# Patient Record
Sex: Female | Born: 1955
Health system: Southern US, Community
[De-identification: ages and names within clinical notes are randomized; demographics above are authoritative.]

## PROBLEM LIST (undated history)

## (undated) DIAGNOSIS — Z923 Personal history of irradiation: Secondary | ICD-10-CM

## (undated) DIAGNOSIS — Z801 Family history of malignant neoplasm of trachea, bronchus and lung: Secondary | ICD-10-CM

## (undated) DIAGNOSIS — R232 Flushing: Secondary | ICD-10-CM

## (undated) DIAGNOSIS — G4700 Insomnia, unspecified: Secondary | ICD-10-CM

## (undated) DIAGNOSIS — R112 Nausea with vomiting, unspecified: Secondary | ICD-10-CM

## (undated) DIAGNOSIS — R5383 Other fatigue: Secondary | ICD-10-CM

## (undated) DIAGNOSIS — R87619 Unspecified abnormal cytological findings in specimens from cervix uteri: Secondary | ICD-10-CM

## (undated) DIAGNOSIS — Z8042 Family history of malignant neoplasm of prostate: Secondary | ICD-10-CM

## (undated) DIAGNOSIS — C50919 Malignant neoplasm of unspecified site of unspecified female breast: Secondary | ICD-10-CM

## (undated) DIAGNOSIS — Z9889 Other specified postprocedural states: Secondary | ICD-10-CM

## (undated) DIAGNOSIS — L409 Psoriasis, unspecified: Secondary | ICD-10-CM

## (undated) DIAGNOSIS — M1990 Unspecified osteoarthritis, unspecified site: Secondary | ICD-10-CM

## (undated) DIAGNOSIS — Z973 Presence of spectacles and contact lenses: Secondary | ICD-10-CM

## (undated) DIAGNOSIS — Z800 Family history of malignant neoplasm of digestive organs: Secondary | ICD-10-CM

## (undated) HISTORY — DX: Malignant neoplasm of unspecified site of unspecified female breast: C50.919

## (undated) HISTORY — DX: Family history of malignant neoplasm of prostate: Z80.42

## (undated) HISTORY — PX: KNEE SURGERY: SHX244

## (undated) HISTORY — DX: Family history of malignant neoplasm of digestive organs: Z80.0

## (undated) HISTORY — DX: Flushing: R23.2

## (undated) HISTORY — DX: Unspecified osteoarthritis, unspecified site: M19.90

## (undated) HISTORY — DX: Unspecified abnormal cytological findings in specimens from cervix uteri: R87.619

## (undated) HISTORY — DX: Family history of malignant neoplasm of trachea, bronchus and lung: Z80.1

## (undated) HISTORY — DX: Insomnia, unspecified: G47.00

## (undated) HISTORY — PX: POLYPECTOMY: SHX149

## (undated) HISTORY — DX: Other specified postprocedural states: Z98.890

## (undated) HISTORY — PX: DILATION AND CURETTAGE OF UTERUS: SHX78

## (undated) HISTORY — DX: Presence of spectacles and contact lenses: Z97.3

## (undated) HISTORY — DX: Other fatigue: R53.83

## (undated) HISTORY — DX: Psoriasis, unspecified: L40.9

---
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
## 1980-03-29 HISTORY — PX: MANDIBLE SURGERY: SHX707

## 1997-09-04 ENCOUNTER — Ambulatory Visit (HOSPITAL_COMMUNITY): Admission: RE | Admit: 1997-09-04 | Discharge: 1997-09-04 | Payer: Self-pay | Admitting: Obstetrics & Gynecology

## 1998-08-28 ENCOUNTER — Inpatient Hospital Stay (HOSPITAL_COMMUNITY): Admission: AD | Admit: 1998-08-28 | Discharge: 1998-08-29 | Payer: Self-pay | Admitting: Obstetrics and Gynecology

## 2000-06-06 ENCOUNTER — Encounter: Admission: RE | Admit: 2000-06-06 | Discharge: 2000-06-06 | Payer: Self-pay | Admitting: Internal Medicine

## 2000-06-06 ENCOUNTER — Encounter: Payer: Self-pay | Admitting: Internal Medicine

## 2001-03-29 DIAGNOSIS — Z9889 Other specified postprocedural states: Secondary | ICD-10-CM

## 2001-03-29 HISTORY — DX: Other specified postprocedural states: Z98.890

## 2001-07-19 ENCOUNTER — Other Ambulatory Visit: Admission: RE | Admit: 2001-07-19 | Discharge: 2001-07-19 | Payer: Self-pay | Admitting: *Deleted

## 2002-07-26 ENCOUNTER — Other Ambulatory Visit: Admission: RE | Admit: 2002-07-26 | Discharge: 2002-07-26 | Payer: Self-pay | Admitting: *Deleted

## 2003-12-10 ENCOUNTER — Other Ambulatory Visit: Admission: RE | Admit: 2003-12-10 | Discharge: 2003-12-10 | Payer: Self-pay | Admitting: *Deleted

## 2005-01-20 ENCOUNTER — Other Ambulatory Visit: Admission: RE | Admit: 2005-01-20 | Discharge: 2005-01-20 | Payer: Self-pay | Admitting: *Deleted

## 2005-12-12 ENCOUNTER — Emergency Department (HOSPITAL_COMMUNITY): Admission: EM | Admit: 2005-12-12 | Discharge: 2005-12-12 | Payer: Self-pay | Admitting: Emergency Medicine

## 2006-11-30 ENCOUNTER — Other Ambulatory Visit: Admission: RE | Admit: 2006-11-30 | Discharge: 2006-11-30 | Payer: Self-pay | Admitting: *Deleted

## 2007-04-12 ENCOUNTER — Ambulatory Visit (HOSPITAL_BASED_OUTPATIENT_CLINIC_OR_DEPARTMENT_OTHER): Admission: RE | Admit: 2007-04-12 | Discharge: 2007-04-12 | Payer: Self-pay | Admitting: Orthopedic Surgery

## 2010-08-11 NOTE — Op Note (Signed)
NAME:  Jennifer Walker, Jennifer Walker NO.:  [id number]   MEDICAL RECORD NO.:  [id number]          PATIENT TYPE:  AMB   LOCATION:  [facility]                         FACILITY:  [facility]   PHYSICIAN:  Ollen Gross, M.D.    DATE OF BIRTH:  03-14-1956   DATE OF PROCEDURE:  04/12/2007  DATE OF DISCHARGE:                               OPERATIVE REPORT   PREOPERATIVE DIAGNOSIS:  Right knee lateral meniscal tear.   POSTOPERATIVE DIAGNOSIS:  Right knee lateral meniscal tear, plus medial  meniscal tear.   PROCEDURE:  Right knee arthroscopy with medial and lateral meniscal  debridement.   SURGEON:  Ollen Gross, M.D.   ASSISTANT:  None.   ANESTHESIA:  General.   ESTIMATED BLOOD LOSS:  Minimal.   DRAINS:  None.   COMPLICATIONS:  None.   CONDITION:  Stable to recovery.   CLINICAL NOTE:  [name] is a 55 year old female who has significant  right knee pain and mechanical symptoms for several months.  She had  similar problems with her left knee a couple of years ago, had an  arthroscopy with excellent improvement.  She has a documented lateral  meniscal tear and worsening symptoms and presents now for arthroscopy  and debridement.   PROCEDURE IN DETAIL:  After successful initiation of general anesthetic,  a tourniquet was placed high on the right thigh and the right lower  extremity prepped and draped in the usual sterile fashion.  Standard  superomedial and inferolateral incisions were made and in flow cannula  passed superomedial and camera passed inferolateral.  Arthroscopic  visualization proceeds.  The under surface of the patella and trochlea  shows grade 1 and 2 chondromalacia of the patella but the trochlea looks  normal.  No evidence of any unstable cartilage on the patella.  Medial  and lateral gutters were visualized and there is no loose bodies  present.  Flexion and valgus force is applied to the knee and the medial  compartment centered.  There is evidence of a  degenerative tear in the  posterior horn of the medial meniscus.  A spinal needle is used to  localize an inferomedial portal, a small incision made, dilator placed,  and then meniscus was debrided back to a stable base with baskets and  4.2 mm shaver and sealed off with the ArthroCare device.  The medial  femoral condyle and tibial plateau looked normal.   The intercondylar notch was visualized, the ACL was normal.  The lateral  compartment was entered.  She does have a degenerative tear body and  posterior horn extending almost to the anterior horn of the lateral  meniscus.  This was debrided back to a stable base with baskets and a  shaver, 4.2 mm, and then is sealed off with the ArthroCare device.  There is some chondromalacia in the lateral compartment but no full  thickness disease.  The rest of the joint was again inspected, there  were no further tears, loose bodies, or defects.  The arthroscopic  equipment was removed from the inferior portals which were closed with  interrupted 4-0 nylon.  20 mL of 0.25% Marcaine with  epi were injected  through the inflow cannula then that is removed and that portal closed  with nylon.  A bulky sterile dressing is then applied and she is  awakened and transferred to recovery in stable condition.      Ollen Gross, M.D.  Electronically Signed     FA/MEDQ  D:  04/12/2007  T:  04/12/2007  Job:  [id number]

## 2010-12-17 LAB — POCT HEMOGLOBIN-HEMACUE
Hemoglobin: 13.5
Operator id: 268271

## 2011-09-28 ENCOUNTER — Other Ambulatory Visit: Payer: Self-pay | Admitting: Obstetrics and Gynecology

## 2011-09-28 DIAGNOSIS — R928 Other abnormal and inconclusive findings on diagnostic imaging of breast: Secondary | ICD-10-CM

## 2011-10-05 ENCOUNTER — Ambulatory Visit
Admission: RE | Admit: 2011-10-05 | Discharge: 2011-10-05 | Disposition: A | Discharge status: Home / Self Care | Payer: 59 | Source: Ambulatory Visit | Attending: Obstetrics and Gynecology | Admitting: Obstetrics and Gynecology

## 2011-10-05 ENCOUNTER — Other Ambulatory Visit: Payer: Self-pay | Admitting: Obstetrics and Gynecology

## 2011-10-05 DIAGNOSIS — R928 Other abnormal and inconclusive findings on diagnostic imaging of breast: Secondary | ICD-10-CM

## 2011-10-05 DIAGNOSIS — N6320 Unspecified lump in the left breast, unspecified quadrant: Secondary | ICD-10-CM

## 2011-10-06 ENCOUNTER — Other Ambulatory Visit: Payer: Self-pay | Admitting: Obstetrics and Gynecology

## 2011-10-06 DIAGNOSIS — C50912 Malignant neoplasm of unspecified site of left female breast: Secondary | ICD-10-CM

## 2011-10-07 ENCOUNTER — Telehealth: Payer: Self-pay | Admitting: *Deleted

## 2011-10-07 ENCOUNTER — Other Ambulatory Visit: Payer: Self-pay | Admitting: *Deleted

## 2011-10-07 DIAGNOSIS — C50419 Malignant neoplasm of upper-outer quadrant of unspecified female breast: Secondary | ICD-10-CM

## 2011-10-07 DIAGNOSIS — C50412 Malignant neoplasm of upper-outer quadrant of left female breast: Secondary | ICD-10-CM | POA: Insufficient documentation

## 2011-10-07 NOTE — Telephone Encounter (Signed)
Confirmed [facility] for 10/13/11 at 0800 .  Instructions and contact information given.  

## 2011-10-08 ENCOUNTER — Ambulatory Visit
Admission: RE | Admit: 2011-10-08 | Discharge: 2011-10-08 | Disposition: A | Discharge status: Home / Self Care | Payer: 59 | Source: Ambulatory Visit | Attending: Obstetrics and Gynecology | Admitting: Obstetrics and Gynecology

## 2011-10-08 ENCOUNTER — Other Ambulatory Visit: Payer: Self-pay | Admitting: Obstetrics and Gynecology

## 2011-10-08 DIAGNOSIS — C50912 Malignant neoplasm of unspecified site of left female breast: Secondary | ICD-10-CM

## 2011-10-08 DIAGNOSIS — R928 Other abnormal and inconclusive findings on diagnostic imaging of breast: Secondary | ICD-10-CM

## 2011-10-08 MED ORDER — GADOBENATE DIMEGLUMINE 529 MG/ML IV SOLN
20.0000 mL | Freq: Once | INTRAVENOUS | Status: AC | PRN
  Administered 2011-10-08: 20 mL via INTRAVENOUS

## 2011-10-12 ENCOUNTER — Ambulatory Visit
Admission: RE | Admit: 2011-10-12 | Discharge: 2011-10-12 | Disposition: A | Discharge status: Home / Self Care | Payer: 59 | Source: Ambulatory Visit | Attending: Obstetrics and Gynecology | Admitting: Obstetrics and Gynecology

## 2011-10-12 ENCOUNTER — Other Ambulatory Visit: Payer: Self-pay | Admitting: Obstetrics and Gynecology

## 2011-10-12 DIAGNOSIS — R928 Other abnormal and inconclusive findings on diagnostic imaging of breast: Secondary | ICD-10-CM

## 2011-10-13 ENCOUNTER — Encounter: Payer: Self-pay | Admitting: *Deleted

## 2011-10-13 ENCOUNTER — Ambulatory Visit (HOSPITAL_BASED_OUTPATIENT_CLINIC_OR_DEPARTMENT_OTHER): Payer: 59 | Admitting: Surgery

## 2011-10-13 ENCOUNTER — Ambulatory Visit (HOSPITAL_BASED_OUTPATIENT_CLINIC_OR_DEPARTMENT_OTHER): Payer: 59 | Admitting: Oncology

## 2011-10-13 ENCOUNTER — Other Ambulatory Visit (HOSPITAL_BASED_OUTPATIENT_CLINIC_OR_DEPARTMENT_OTHER): Payer: 59 | Admitting: Lab

## 2011-10-13 ENCOUNTER — Ambulatory Visit: Payer: 59 | Attending: Surgery | Admitting: Physical Therapy

## 2011-10-13 ENCOUNTER — Other Ambulatory Visit (INDEPENDENT_AMBULATORY_CARE_PROVIDER_SITE_OTHER): Payer: Self-pay | Admitting: Surgery

## 2011-10-13 ENCOUNTER — Ambulatory Visit
Admission: RE | Admit: 2011-10-13 | Discharge: 2011-10-13 | Disposition: A | Discharge status: Home / Self Care | Payer: 59 | Source: Ambulatory Visit | Attending: Radiation Oncology | Admitting: Radiation Oncology

## 2011-10-13 VITALS — BP 125/81 | HR 58 | Temp 98.0°F | Ht 69.5 in | Wt 221.5 lb

## 2011-10-13 DIAGNOSIS — C50019 Malignant neoplasm of nipple and areola, unspecified female breast: Secondary | ICD-10-CM

## 2011-10-13 DIAGNOSIS — M25619 Stiffness of unspecified shoulder, not elsewhere classified: Secondary | ICD-10-CM | POA: Insufficient documentation

## 2011-10-13 DIAGNOSIS — C50419 Malignant neoplasm of upper-outer quadrant of unspecified female breast: Secondary | ICD-10-CM

## 2011-10-13 DIAGNOSIS — IMO0001 Reserved for inherently not codable concepts without codable children: Secondary | ICD-10-CM | POA: Insufficient documentation

## 2011-10-13 DIAGNOSIS — C50919 Malignant neoplasm of unspecified site of unspecified female breast: Secondary | ICD-10-CM

## 2011-10-13 DIAGNOSIS — Z170 Estrogen receptor positive status [ER+]: Secondary | ICD-10-CM

## 2011-10-13 LAB — COMPREHENSIVE METABOLIC PANEL
AST: 18 U/L (ref 0–37)
BUN: 20 mg/dL (ref 6–23)
Calcium: 9.6 mg/dL (ref 8.4–10.5)
Chloride: 101 mEq/L (ref 96–112)
Creatinine, Ser: 0.89 mg/dL (ref 0.50–1.10)

## 2011-10-13 LAB — CBC WITH DIFFERENTIAL/PLATELET
Basophils Absolute: 0 10*3/uL (ref 0.0–0.1)
EOS%: 2.2 % (ref 0.0–7.0)
HCT: 39.3 % (ref 34.8–46.6)
HGB: 13.4 g/dL (ref 11.6–15.9)
MCH: 31.7 pg (ref 25.1–34.0)
MCV: 92.7 fL (ref 79.5–101.0)
NEUT%: 57.8 % (ref 38.4–76.8)
lymph#: 1 10*3/uL (ref 0.9–3.3)

## 2011-10-13 NOTE — Progress Notes (Signed)
Jennifer Walker [id number] October 17, 1955 56 y.o. 10/13/2011 8:08 PM  CC Dr Donovan Kail    REASON FOR CONSULTATION:  Breast cancer Patient was seen in the Multidisciplinary Breast Clinic for discussion of her treatment options. She was seen by [facility] Pierce Crane, Radiation [facility]  STAGE:   Cancer of upper-outer quadrant of female breast   Primary site: Breast (Left)   Staging method: AJCC 7th Edition   Clinical: Stage IA (T1c, N0, cM0)   Summary: Stage IA (T1c, N0, cM0)  REFERRING PHYSICIAN: Dr. Donovan Kail  HISTORY OF PRESENT ILLNESS:  Jennifer Walker is a 56 y.o. female.  Your today for a husband for a discussion of her recent diagnosis of breast cancer. She has been in reasonably good health. She had her last mammogram about 2 years ago. She did not detect any abnormalities in her breast. A screening mammogram was performed 09/22/2011. An abnormality was detected in the left breast. A mammogram from 10/05/2011 showed an irregular mass from 30 position 7 cm from the left nipple measuring 1.2 x 1.1 x 1 cm. No abnormal lymph nodes were detected. A biopsy was recommended. This took place on 10/05/2011. Pathology showed a ductal cancer and sister with a grade 1 lesion. This had a proliferative index of 8%, PR positive percent, PR +39%. The HER-2 was nonamplified and had a ratio of 1.32.: MRI scans of both breasts was performed on 10/08/2011. This showed a 1.9 x 1.5 x 1.1 cm mass with biopsy clip artifact. There was an additional mass measuring 7 x 7 x 5 mm in the anterior aspect of left breast. This was biopsied and was determined to be consistent with a papilloma.    Past Medical History:  Past Medical History  Diagnosis Date  . Breast cancer   . Insomnia   . H/O colonoscopy 2003  . Wears glasses   . Fatigue   . Psoriasis   . Hot flashes     Past Surgical History: Past Surgical History  Procedure Date  . Knee surgery 2005, 2007      Family History Family History  Problem Relation Age of Onset  . Breast cancer Mother 74    Social History History  Substance Use Topics  . Smoking status: Never Smoker   . Smokeless tobacco: Not on file  . Alcohol Use: 3.6 oz/week    6 Glasses of wine per week   married x17 years. 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  Allergies Allergies  Allergen Reactions  . Sulfa Antibiotics   . Sulfa Drugs Cross Reactors     Current Medications Current Outpatient Prescriptions  Medication Sig Dispense Refill  . zolpidem (AMBIEN) 10 MG tablet Take 10 mg by mouth at bedtime as needed.        OB/GYN History G2 P2, menarche age 23, last period at age 74 was using birth control pills for 1985 and your 2000. Age of first birth was at 4.  Fertility Discussion Prior History of Cancer: no Health Maintenance:  Colonoscopy yes Bone Density no Last PAP smear yes  ECOG PERFORMANCE STATUS: 0 - Asymptomatic  Genetic Counseling/testing: Referral made  REVIEW OF SYSTEMS:  Pertinent items are noted in HPI.  PHYSICAL EXAMINATION: Blood pressure 125/81, pulse 58, temperature 98 F (36.7 C), height 5' 9.5" (1.765 m), weight 221 lb 8 oz (100.472 kg).  HEENT:  Sclerae anicteric, conjunctivae pink.  Oropharynx clear.  No mucositis or candidiasis.  Nodes:  No cervical, supraclavicular, or axillary lymphadenopathy palpated.  Breast Exam:  Right breast is benign.  No masses, discharge, skin change, or nipple inversion.  Left breast is benign. There are ecchymoses seen.  No masses, discharge, skin change, or nipple inversion..  Lungs:  Clear to auscultation bilaterally.  No crackles, rhonchi, or wheezes.  Heart:  Regular rate and rhythm.  Abdomen:  Soft, nontender.  Positive bowel sounds.  No organomegaly or masses palpated.  Musculoskeletal:  No focal spinal tenderness to palpation.  Extremities:  Benign.  No peripheral edema or cyanosis.   Skin:  Benign.  Neuro:  Nonfocal.      STUDIES/RESULTS: US Breast Left  10-29-2011  *RADIOLOGY REPORT*  Clinical Data:  7 mm mass seen in the lower outer subareolar region of the left breast on a recent staging MR of the breasts.  Recently diagnosed invasive ductal carcinoma and ductal carcinoma in situ deep in the upper outer quadrant of the left breast.  LEFT BREAST ULTRASOUND  Comparison:  Previous examinations, including the breast MR dated 10/08/2011.  On physical exam, no mass is palpable in the lower outer subareolar region of the left breast.  Findings: Ultrasound is performed, showing a 1.2 x 1.1 x 0.5 cm horizontally oriented, hypoechoic mass with mildly irregular and angulated margins in the 4 o'clock subareolar region of the left breast.  This corresponds to the 0.7 cm mass seen on the recent MRI.  IMPRESSION: 1.2 cm mass in the 4 o'clock subareolar region of the left breast. This could represent an invasive mammary carcinoma or, possibly, a papillary lesion.  Ultrasound-guided core needle biopsy is recommended and is scheduled to follow.  RECOMMENDATION: Left breast ultrasound guided core needle biopsy (scheduled to follow).   BI-RADS CATEGORY 4:  Suspicious abnormality - biopsy should be considered.  Original Report Authenticated By: Darrol Angel, M.D.   US Breast Left  10/05/2011  *RADIOLOGY REPORT*  Clinical Data:  The patient returns for evaluation of a possible mass in the left upper outer quadrant noted on recent screening study dated 09/22/2011 from [facility].  DIGITAL DIAGNOSTIC LEFT MAMMOGRAM  AND LEFT BREAST ULTRASOUND:  Comparison:  10/24/2009 and 07/04/2008 from [facility]  Findings:  Additional views confirm the presence of an irregular mass in the left upper outer quadrant posteriorly.  On physical exam, no mass is palpated in the.  Ultrasound is performed, showing an irregular solid mass at 1:30 o'clock 7 cm from the left nipple measuring 1.2 x 1.1 x 1.0 cm.   No abnormal lymph nodes are noted in the left axilla. The appearance is highly suspicious for invasive mammary carcinoma.  Biopsy is recommended.  IMPRESSION: Highly suspicious irregular mass at 1:30 o'clock, 7 cm from the left nipple.  RECOMMENDATION: Ultrasound-guided core needle biopsy is suggested.  This will be performed and reported separately.  BI-RADS CATEGORY 5:  Highly suggestive of malignancy - appropriate action should be taken.  Original Report Authenticated By: Daryl Eastern, M.D.   Mr Breast Bilateral W Wo Contrast  10/08/2011  *RADIOLOGY REPORT*  Clinical Data: Recently diagnosed left breast invasive ductal carcinoma and ductal carcinoma in situ.  BILATERAL BREAST MRI WITH AND WITHOUT CONTRAST  Technique: Multiplanar, multisequence MR images of both breasts were obtained prior to and following the intravenous administration of 20ml of MultiHance.  Three dimensional images were evaluated at the independent DynaCad workstation.  Comparison:  Recent mammogram, ultrasound and biopsy examinations.  Findings: 1.9  x 1.5 x 1.1 cm enhancing mass with irregular margins deep in the upper outer portion of the left breast.  This contains a biopsy marker clip artifact and biopsy needle tract and corresponds to the recently biopsied invasive ductal carcinoma and ductal carcinoma in situ.  This has a mixture of enhancement kinetics, including a small amount of rapid washin/washout.  There is also a 0.7 x 0.7 x 0.5 cm mass in the anterior aspect of the left breast, slightly inferiorly and slightly laterally.  This has mildly irregular margins and has predominately plateau enhancement kinetics.  No additional masses or areas of enhancement suspicious for malignancy in either breast.  No abnormal appearing lymph nodes.  IMPRESSION:  1.  1.9 x 1.5 x 1.1 cm biopsy-proven invasive ductal carcinoma and ductal carcinoma in situ in the upper outer left breast. 2.  Additional 0.7 x 0.7 x 0.5 cm mass in the lower  outer subareolar region of the left breast, suspicious for an additional focus of malignancy.  Targeted ultrasound of this region is recommended as well as ultrasound guided core needle biopsy.  If this is sonographically occult, an MR guided core needle biopsy would be recommended.  RECOMMENDATION: Left breast ultrasound and core needle biopsy.  We will contact the patient to return for the ultrasound and biopsy.  THREE-DIMENSIONAL MR IMAGE RENDERING ON INDEPENDENT WORKSTATION:  Three-dimensional MR images were rendered by post-processing of the original MR data on an independent workstation.  The three- dimensional MR images were interpreted, and findings were reported in the accompanying complete MRI report for this study.  BI-RADS CATEGORY 4:  Suspicious abnormality - biopsy should be considered.  Original Report Authenticated By: Darrol Angel, M.D.   [place] Core Biopsy  10/13/2011  **ADDENDUM** CREATED: 10/13/2011 18:07:00  The final pathological diagnosis is intraductal papilloma.  This is concordant with the imaging findings.  Surgical excision is recommended.  The final pathological diagnosis was discussed with the patient by telephone on 10/13/2011.  Her questions were answered.  She reported some pain at the biopsy site with no bruising or palpable hematoma.  Surgical excision of the patient's papilloma and known malignancy in the left breast has been scheduled with Dr. Ezzard Standing.  **END ADDENDUM** SIGNED BY: Londell Moh. Azucena Kuba, M.D.   10/12/2011  *RADIOLOGY REPORT*  Clinical Data:  1.2 cm 4 o'clock subareolar left breast mass at ultrasound earlier today and on recent screening MR of the breasts.  ULTRASOUND GUIDED VACUUM ASSISTED CORE BIOPSY OF THE LEFT BREAST  I met with the patient, and we discussed the procedure of ultrasound-guided biopsy, including risks.  Specifically, we discussed the risks of bleeding, infection and clip migration.  We discussed the high likelihood of a successful procedure.  Informed,  written consent was given.  Using sterile technique, local anesthesia, ultrasound guidance, and a 12 gauge vacuum assisted needle, biopsy was performed of the 1.2 cm mass seen in the 4 o'clock subareolar region of the left breast at ultrasound earlier today.  An inferior approach was used.  At the conclusion of the procedure, a tissue marker clip was deployed into the biopsy cavity.  A follow-up 2-view mammogram was performed and dictated separately.  IMPRESSION: Ultrasound-guided biopsy of a 1.2 cm 4 o'clock subareolar left breast mass.  No apparent complications.  Original Report Authenticated By: Darrol Angel, M.D.   [place] Core Biopsy  10/06/2011  **ADDENDUM** CREATED: 10/06/2011 12:56:58  Histologic evaluation demonstrates grade I invasive ductal carcinoma with associated ductal carcinoma in situ.  This is concordant with the imaging findings.  Results were discussed with the patient by telephone at her request.  She reports no complications from the procedure.  Breast MRI is scheduled for 10/08/2011.  The patient is scheduled to be seen in the [facility] on 10/13/2011.  The patient was encouraged to pick up educational materials at our office. Questions were answered.  **END ADDENDUM** SIGNED BY: Cain Saupe, M.D.   10/05/2011  *RADIOLOGY REPORT*  Clinical Data:  Irregular mass at 1:30 o'clock, 7 cm from the left nipple  ULTRASOUND GUIDED VACUUM ASSISTED CORE BIOPSY OF THE LEFT BREAST  The patient and nine discussed the procedure of ultrasound-guided biopsy, including benefits and alternatives.  We discussed the high likelihood of a successful procedure. We discussed the risks of the procedure including infection, bleeding, clip migration, and inadequate sampling.  Informed written consent was given.  Using sterile technique, 2% lidocaine ultrasound guidance and a 12 gauge vacuum assisted needle, biopsy was performed of the mass at 1:30 o'clock, 7 cm from the left  nipple.  At the conclusion of the procedure, a ribbon tissue marker clip was deployed into the biopsy cavity.  Follow-up 2-view mammogram was performed and dictated separately.  IMPRESSION: Ultrasound-guided biopsy of a mass at 1:30 o'clock 7 cm from the left nipple.  Pathologic results are pending.  No apparent complications.  Original Report Authenticated By: Daryl Eastern, M.D.   Mm Digital Diagnostic Unilat L  10/12/2011  *RADIOLOGY REPORT*  Clinical Data:  Titanium marker placement following ultrasound guided core needle biopsy earlier today.  DIGITAL DIAGNOSTIC LEFT MAMMOGRAM  Comparison:  Recent imaging examinations.  Findings:  Digital mammographic images were performed following ultrasound guided biopsy of a 1.2 cm 4 o'clock subareolar left breast mass.  These demonstrate a wing clip at the expected location of the biopsied mass.  This is located 8 cm anterior, inferior and medial to the ribbon shaped clip placed at the time of biopsy of the patient's known malignancy deep in the upper outer left breast.  IMPRESSION: Appropriate clip deployment following ultrasound-guided core needle biopsy of a 1.2 cm 4 o'clock subareolar left breast mass.  The two clips in the patient's breast are located 8 cm apart.  Original Report Authenticated By: Darrol Angel, M.D.   Mm Digital Diagnostic Unilat L  10/05/2011  *RADIOLOGY REPORT*  Clinical Data:  Irregular mass at 1:30 o'clock, 7 cm from the left nipple.  Ultrasound-guided core needle biopsy with clip placement.  DIGITAL DIAGNOSTIC LEFT MAMMOGRAM  Comparison:  Previous exams  Findings:  Films are performed following ultrasound guided biopsy of an irregular mass at 1:30 o'clock, 7 cm from the left nipple. The InRad ribbon clip is appropriately positioned within the mass.  IMPRESSION: Appropriate clip placement following ultrasound-guided core needle biopsy of a mass at 1:30 o'clock, 7 cm from the left nipple.  Original Report Authenticated By: Daryl Eastern, M.D.   Mm Digital Diagnostic Unilat L  10/05/2011  *RADIOLOGY REPORT*  Clinical Data:  The patient returns for evaluation of a possible mass in the left upper outer quadrant noted on recent screening study dated 09/22/2011 from [facility].  DIGITAL DIAGNOSTIC LEFT MAMMOGRAM  AND LEFT BREAST ULTRASOUND:  Comparison:  10/24/2009 and 07/04/2008 from [facility]  Findings:  Additional views confirm the presence of an irregular mass in the left upper outer quadrant posteriorly.  On physical exam, no mass is palpated in the.  Ultrasound is performed,  showing an irregular solid mass at 1:30 o'clock 7 cm from the left nipple measuring 1.2 x 1.1 x 1.0 cm.  No abnormal lymph nodes are noted in the left axilla. The appearance is highly suspicious for invasive mammary carcinoma.  Biopsy is recommended.  IMPRESSION: Highly suspicious irregular mass at 1:30 o'clock, 7 cm from the left nipple.  RECOMMENDATION: Ultrasound-guided core needle biopsy is suggested.  This will be performed and reported separately.  BI-RADS CATEGORY 5:  Highly suggestive of malignancy - appropriate action should be taken.  Original Report Authenticated By: Daryl Eastern, M.D.     LABS:    Chemistry      Component Value Date/Time   NA 139 10/13/2011 0816   K 4.2 10/13/2011 0816   CL 101 10/13/2011 0816   CO2 30 10/13/2011 0816   BUN 20 10/13/2011 0816   CREATININE 0.89 10/13/2011 0816      Component Value Date/Time   CALCIUM 9.6 10/13/2011 0816   ALKPHOS 45 10/13/2011 0816   AST 18 10/13/2011 0816   ALT 12 10/13/2011 0816   BILITOT 0.4 10/13/2011 0816      Lab Results  Component Value Date   WBC 3.3* 10/13/2011   HGB 13.4 10/13/2011   HCT 39.3 10/13/2011   MCV 92.7 10/13/2011   PLT 229 10/13/2011       PATHOLOGY: As above  ASSESSMENT    Known ER/PR positive breast cancer left breast low histological grade. There is a second  area which is a papilloma.  Clinical Trial  Eligibility:no Multidisciplinary conference discussion yes     PLAN:    The patient has agreed to lumpectomy of both areas. She wishes to delay to she returns from holidays. I will likely see her in early September after she has had an opportunity to submit tissue for Oncotype score. She'll under I also undergo genetic counseling and so this will take some time. I outlined the options in terms of the Oncotype score indicated that in all likelihood she would be a low or intermediate Oncotype score. She basically said that in the face of a score of that is intermediate or higher she would likely consider chemotherapy.       Discussion: Patient is being treated per NCCN breast cancer care guidelines appropriate for stage.I   Thank you so much for allowing me to participate in the care of RONIQUE SIMERLY. I will continue to follow up the patient with you and assist in her care.  All questions were answered. The patient knows to call the clinic with any problems, questions or concerns. We can certainly see the patient much sooner if necessary.  I spent 55 minutes counseling the patient face to face. The total time spent in the appointment was 25 minutes.     Mervin Hack M.D. FRCP C.   10/13/2011, 8:08 PM

## 2011-10-13 NOTE — Progress Notes (Addendum)
Re:   Jennifer Walker DOB:   [birth year] MRN:   [id number]  ASSESSMENT AND PLAN: 1.  Left breast cancer, OUQ  1.9 cm on MRI  IDC, ER - 100%, PR - 39 %, Ki67 - 8%, and Her2Neu - neg.  I discussed the options for breast cancer treatment with the patient.  I discussed the idea of a multidisciplinary approach to the treatment of breast cancer, which includes medical oncology and radiation oncology.  I discussed the surgical options of lumpectomy vs. mastectomy.  I discussed the options of lymph node biopsy.  The treatment plan depends on the pathologic staging of the tumor and the patient's personal wishes.  The risks of surgery include, but are not limited to, bleeding, infection, the need for further surgery, and nerve injury.  The patient has been given literature on the treatment of breast cancer.  I spent 45 minutes talking to patient and husband.  Plan:  Left breast wire loc lumpectomy x 2 (one area is benign), left axillary SLNBx - we will plan to do the surgery the first week of August (she understands that I am on vacation for 2 weeks). We went back and forth with the patient about timing and time of treatment.  I spent about 1 hour talking to patient and husband.   She sees the geneticist tomorrow.  She would like the BRCA results before surgery, but we will see.   Dr. Donnie Coffin plans an Oncotype to guide therapy.  Post op radiation by Dr. Michell Heinrich.  [Patient has switched to Dr. Darnelle Catalan for oncology.  DN  11/03/2011]  2.  Papilloma, LOQ  Will excise (using wire loc) at same time as primary cancer 3.  Psoriasis  REFERRING PHYSICIAN:  Duane Lope ([facility] B[facility]), M.D.  HISTORY OF PRESENT ILLNESS: Jennifer Walker is a 56 y.o. (DOB: 06-23-1955)  white female whose primary care physician is Creola Corn (though she has not seen him in 5 years) and comes to me today for left breast cancer.  Mother died of inflammatory breast cancer int he 79's.  She had a right breast biopsy and right axillary node  biopsy at age 57.  She had not had a mammogram in 2 years.  She has been under a lot of stress the last 3 years in that both her husband's parents died in those 3 years and she was the primary caregiver.  She is now looking at trying to get in better shape.  She went through menopause 4 years ago and this has added to her stress.  She has insomnia and depression.  She has refused to take hormonal replacement therapy.  She did have a right breast biopsy in 1978 which was benign.  She had a recent mammogram which prompted a core biopsy of her left breast.  The biopsy showed IDC.  The MRI on 10/08/2011 showed a 1.9 cm enhancing mass in the UOQ of the left breast.  This correlates with her primary breast cancer.  She had a second lesion seen on MRI in the LOQ of the left breast.  Biopsy of this showed a papilloma (the typed report is pending).    Past Medical History  Diagnosis Date  . Breast cancer   . Insomnia   . H/O colonoscopy 2003  . Wears glasses   . Fatigue   . Psoriasis   . Hot flashes       Past Surgical History  Procedure Date  . Knee surgery 2005, 2007  Current Outpatient Prescriptions  Medication Sig Dispense Refill  . zolpidem (AMBIEN) 10 MG tablet Take 10 mg by mouth at bedtime as needed.          Allergies  Allergen Reactions  . Sulfa Antibiotics   . Sulfa Drugs Cross Reactors     REVIEW OF SYSTEMS: Skin:  Psoriasis. Infection:  No history of hepatitis or HIV.  No history of MRSA. Neurologic:  No history of stroke.  No history of seizure.  No history of headaches. Cardiac:  No history of hypertension. No history of heart disease.  No history of seeing a cardiologist. Pulmonary:  Does not smoke cigarettes.  No asthma or bronchitis.  No OSA/CPAP.  Endocrine:  No diabetes. No thyroid disease. Gastrointestinal:  Colonoscopy about 10 years ago for rectal bleeding.  Unknown gastroenterologist. Urologic:  No history of kidney stones.  No history of bladder  infections. Musculoskeletal:  No history of joint or back disease. Hematologic:  No bleeding disorder.  No history of anemia.  Not anticoagulated. Psycho-social:  The patient is oriented.   The patient has no obvious psychologic or social impairment to understanding our conversation and plan.  SOCIAL and FAMILY HISTORY: Married.  Her husband is with her. Does not work.  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 She has two children - 13 and 15. She stressed over and over about living to be 56 years old.  PHYSICAL EXAM: There were no vitals taken for this visit.  General: WN WF who is alert and generally healthy appearing.  HEENT: Normal. Pupils equal. Neck: Supple. No mass.  No thyroid mass. Lymph Nodes:  No supraclavicular, cervical, or axillary nodes. Lungs: Clear to auscultation and symmetric breath sounds. Heart:  RRR. No murmur or rub. Breast:  Left - bruise in UOQ.  I may feel a mass effect, but I am not sure.  Right - No mass.  Abdomen: Soft. No mass. No tenderness. No hernia. Normal bowel sounds.  No abdominal scars. Rectal: Not done. Extremities:  Good strength and ROM  in upper and lower extremities. Neurologic:  Grossly intact to motor and sensory function. Psychiatric: Has normal mood and affect. Behavior is normal.   DATA REVIEWED: Path report to patient and radiology studies. CA 27.29 - 11 (normal) - 10/13/2011  Ovidio Kin, MD,  [facility] [address]  [address]   [place], [place]    [address] Phone:  [phone] FAX:  [phone]

## 2011-10-13 NOTE — Progress Notes (Signed)
Radiation Oncology         [phone]) [phone] ________________________________  Initial outpatient Consultation  Name: Jennifer Walker MRN: [id number]  Date: 10/13/2011  DOB: [birth year]  REFERRING PHYSICIAN: Kandis Cocking, MD  DIAGNOSIS: T1cN0 IDC of left breast  HISTORY OF PRESENT ILLNESS::Jennifer Walker is a 56 y.o. female  who presented to her primary care physician for routine physical and was told for screening mammogram. A left breast mass was noted measuring 1.2 x 1 x 1.0 cm by ultrasound. She denies any breast pain or palpable masses prior to her biopsy. She denies any prior history of breast cancer or breast issues. An MRI of the bilateral breasts was performed which showed a 1.9 x 1.5 x 1.1 cm mass in the upper outer quadrant. A second mass was noted in the subareolar aspect of the breast. Ultrasound of this area confirmed a 1.2 cm mass and a biopsy was consistent with an intraductal papilloma. Biopsy of the upper outer quadrant mass was a grade 1 invasive ductal carcinoma ER/PR positive HER-2 negative key 67 8%. Jennifer Walker is quite distressed at the news of her new diagnosis given her mother died at the age of 50 after a 2 year long battle with inflammatory breast cancer. The anniversary of her diabetes in July as is Ms. Girardot's birthday and she will be about 10 and her mother was when she was diagnosed. She also deals mostly with insomnia. She had been seeing her primary care physician because she is ready to start an exercise program and make some healthy living choices. She is a beach trip scheduled August 10 that she would like to attend and asked that her treatment be scheduled around this. She has no bone pain headaches or focal numbness or weakness.Marland Kitchen  PREVIOUS RADIATION THERAPY: No  PAST MEDICAL HISTORY:  has a past medical history of Breast cancer; Insomnia; H/O colonoscopy (2003); Wears glasses; Fatigue; Psoriasis; and Hot flashes.    PAST SURGICAL HISTORY: Past  Surgical History  Procedure Date  . Knee surgery 2005, 2007    FAMILY HISTORY: family history includes Breast cancer in her mother.  SOCIAL HISTORY:  reports that she has never smoked. She does not have any smokeless tobacco history on file. She reports that she drinks about 3.6 ounces of alcohol per week. She reports that she does not use illicit drugs.  ALLERGIES: Sulfa antibiotics and Sulfa drugs cross reactors  MEDICATIONS:  Current Outpatient Prescriptions  Medication Sig Dispense Refill  . zolpidem (AMBIEN) 10 MG tablet Take 10 mg by mouth at bedtime as needed.        REVIEW OF SYSTEMS:  A 15 point review of systems is documented in the electronic medical record. This was obtained by the nursing staff. However, I reviewed this with the patient to discuss relevant findings and make appropriate changes.  Pertinent items are noted in HPI.   PHYSICAL EXAM: She is a pleasant female in no distress sitting comfortably examining table. She is appears her stated age. Just a bubble cervical or supraclavicular adenopathy. I am not able to palpate any biopsy change in the subareolar portion of the left breast. I do palpate biopsy change in the upper outer quadrant of the left breast. There is some bruising here. There is no palpable abnormalities the right breast. No palpable axillary adenopathy. She is alert and oriented x3. Cranial nerves II through XII are tested and intact. She has no lymphedema bilaterally.  LABORATORY DATA:  Lab Results  Component Value Date   WBC 3.3* 10/13/2011   HGB 13.4 10/13/2011   HCT 39.3 10/13/2011   MCV 92.7 10/13/2011   PLT 229 10/13/2011   Lab Results  Component Value Date   NA 139 10/13/2011   K 4.2 10/13/2011   CL 101 10/13/2011   CO2 30 10/13/2011   Lab Results  Component Value Date   ALT 12 10/13/2011   AST 18 10/13/2011   ALKPHOS 45 10/13/2011   BILITOT 0.4 10/13/2011     RADIOGRAPHY: US Breast Left  10/12/2011  *RADIOLOGY REPORT*  Clinical Data:  7  mm mass seen in the lower outer subareolar region of the left breast on a recent staging MR of the breasts.  Recently diagnosed invasive ductal carcinoma and ductal carcinoma in situ deep in the upper outer quadrant of the left breast.  LEFT BREAST ULTRASOUND  Comparison:  Previous examinations, including the breast MR dated 10/08/2011.  On physical exam, no mass is palpable in the lower outer subareolar region of the left breast.  Findings: Ultrasound is performed, showing a 1.2 x 1.1 x 0.5 cm horizontally oriented, hypoechoic mass with mildly irregular and angulated margins in the 4 o'clock subareolar region of the left breast.  This corresponds to the 0.7 cm mass seen on the recent MRI.  IMPRESSION: 1.2 cm mass in the 4 o'clock subareolar region of the left breast. This could represent an invasive mammary carcinoma or, possibly, a papillary lesion.  Ultrasound-guided core needle biopsy is recommended and is scheduled to follow.  RECOMMENDATION: Left breast ultrasound guided core needle biopsy (scheduled to follow).   BI-RADS CATEGORY 4:  Suspicious abnormality - biopsy should be considered.  Original Report Authenticated By: Darrol Angel, M.D.   US Breast Left  10/05/2011  *RADIOLOGY REPORT*  Clinical Data:  The patient returns for evaluation of a possible mass in the left upper outer quadrant noted on recent screening study dated 09/22/2011 from [facility].  DIGITAL DIAGNOSTIC LEFT MAMMOGRAM  AND LEFT BREAST ULTRASOUND:  Comparison:  10/24/2009 and 07/04/2008 from [facility]  Findings:  Additional views confirm the presence of an irregular mass in the left upper outer quadrant posteriorly.  On physical exam, no mass is palpated in the.  Ultrasound is performed, showing an irregular solid mass at 1:30 o'clock 7 cm from the left nipple measuring 1.2 x 1.1 x 1.0 cm.  No abnormal lymph nodes are noted in the left axilla. The appearance is highly suspicious for invasive mammary carcinoma.   Biopsy is recommended.  IMPRESSION: Highly suspicious irregular mass at 1:30 o'clock, 7 cm from the left nipple.  RECOMMENDATION: Ultrasound-guided core needle biopsy is suggested.  This will be performed and reported separately.  BI-RADS CATEGORY 5:  Highly suggestive of malignancy - appropriate action should be taken.  Original Report Authenticated By: Daryl Eastern, M.D.   Mr Breast Bilateral W Wo Contrast  10/08/2011  *RADIOLOGY REPORT*  Clinical Data: Recently diagnosed left breast invasive ductal carcinoma and ductal carcinoma in situ.  BILATERAL BREAST MRI WITH AND WITHOUT CONTRAST  Technique: Multiplanar, multisequence MR images of both breasts were obtained prior to and following the intravenous administration of 20ml of MultiHance.  Three dimensional images were evaluated at the independent DynaCad workstation.  Comparison:  Recent mammogram, ultrasound and biopsy examinations.  Findings: 1.9 x 1.5 x 1.1 cm enhancing mass with irregular margins deep in the upper outer portion of the left breast.  This contains a biopsy marker clip artifact  and biopsy needle tract and corresponds to the recently biopsied invasive ductal carcinoma and ductal carcinoma in situ.  This has a mixture of enhancement kinetics, including a small amount of rapid washin/washout.  There is also a 0.7 x 0.7 x 0.5 cm mass in the anterior aspect of the left breast, slightly inferiorly and slightly laterally.  This has mildly irregular margins and has predominately plateau enhancement kinetics.  No additional masses or areas of enhancement suspicious for malignancy in either breast.  No abnormal appearing lymph nodes.  IMPRESSION:  1.  1.9 x 1.5 x 1.1 cm biopsy-proven invasive ductal carcinoma and ductal carcinoma in situ in the upper outer left breast. 2.  Additional 0.7 x 0.7 x 0.5 cm mass in the lower outer subareolar region of the left breast, suspicious for an additional focus of malignancy.  Targeted ultrasound of this  region is recommended as well as ultrasound guided core needle biopsy.  If this is sonographically occult, an MR guided core needle biopsy would be recommended.  RECOMMENDATION: Left breast ultrasound and core needle biopsy.  We will contact the patient to return for the ultrasound and biopsy.  THREE-DIMENSIONAL MR IMAGE RENDERING ON INDEPENDENT WORKSTATION:  Three-dimensional MR images were rendered by post-processing of the original MR data on an independent workstation.  The three- dimensional MR images were interpreted, and findings were reported in the accompanying complete MRI report for this study.  BI-RADS CATEGORY 4:  Suspicious abnormality - biopsy should be considered.  Original Report Authenticated By: Darrol Angel, M.D.   [place] Core Biopsy  10/12/2011  *RADIOLOGY REPORT*  Clinical Data:  1.2 cm 4 o'clock subareolar left breast mass at ultrasound earlier today and on recent screening MR of the breasts.  ULTRASOUND GUIDED VACUUM ASSISTED CORE BIOPSY OF THE LEFT BREAST  I met with the patient, and we discussed the procedure of ultrasound-guided biopsy, including risks.  Specifically, we discussed the risks of bleeding, infection and clip migration.  We discussed the high likelihood of a successful procedure.  Informed, written consent was given.  Using sterile technique, local anesthesia, ultrasound guidance, and a 12 gauge vacuum assisted needle, biopsy was performed of the 1.2 cm mass seen in the 4 o'clock subareolar region of the left breast at ultrasound earlier today.  An inferior approach was used.  At the conclusion of the procedure, a tissue marker clip was deployed into the biopsy cavity.  A follow-up 2-view mammogram was performed and dictated separately.  IMPRESSION: Ultrasound-guided biopsy of a 1.2 cm 4 o'clock subareolar left breast mass.  No apparent complications.  Original Report Authenticated By: Darrol Angel, M.D.   [place] Core Biopsy  10/06/2011  **ADDENDUM** CREATED: 10/06/2011  12:56:58  Histologic evaluation demonstrates grade I invasive ductal carcinoma with associated ductal carcinoma in situ.  This is concordant with the imaging findings.  Results were discussed with the patient by telephone at her request.  She reports no complications from the procedure.  Breast MRI is scheduled for 10/08/2011.  The patient is scheduled to be seen in the [facility] on 10/13/2011.  The patient was encouraged to pick up educational materials at our office. Questions were answered.  **END ADDENDUM** SIGNED BY: Cain Saupe, M.D.   10/05/2011  *RADIOLOGY REPORT*  Clinical Data:  Irregular mass at 1:30 o'clock, 7 cm from the left nipple  ULTRASOUND GUIDED VACUUM ASSISTED CORE BIOPSY OF THE LEFT BREAST  The patient and nine discussed the procedure of ultrasound-guided biopsy, including  benefits and alternatives.  We discussed the high likelihood of a successful procedure. We discussed the risks of the procedure including infection, bleeding, clip migration, and inadequate sampling.  Informed written consent was given.  Using sterile technique, 2% lidocaine ultrasound guidance and a 12 gauge vacuum assisted needle, biopsy was performed of the mass at 1:30 o'clock, 7 cm from the left nipple.  At the conclusion of the procedure, a ribbon tissue marker clip was deployed into the biopsy cavity.  Follow-up 2-view mammogram was performed and dictated separately.  IMPRESSION: Ultrasound-guided biopsy of a mass at 1:30 o'clock 7 cm from the left nipple.  Pathologic results are pending.  No apparent complications.  Original Report Authenticated By: Daryl Eastern, M.D.   Mm Digital Diagnostic Unilat L  10/12/2011  *RADIOLOGY REPORT*  Clinical Data:  Titanium marker placement following ultrasound guided core needle biopsy earlier today.  DIGITAL DIAGNOSTIC LEFT MAMMOGRAM  Comparison:  Recent imaging examinations.  Findings:  Digital mammographic images were performed  following ultrasound guided biopsy of a 1.2 cm 4 o'clock subareolar left breast mass.  These demonstrate a wing clip at the expected location of the biopsied mass.  This is located 8 cm anterior, inferior and medial to the ribbon shaped clip placed at the time of biopsy of the patient's known malignancy deep in the upper outer left breast.  IMPRESSION: Appropriate clip deployment following ultrasound-guided core needle biopsy of a 1.2 cm 4 o'clock subareolar left breast mass.  The two clips in the patient's breast are located 8 cm apart.  Original Report Authenticated By: Darrol Angel, M.D.   Mm Digital Diagnostic Unilat L  10/05/2011  *RADIOLOGY REPORT*  Clinical Data:  Irregular mass at 1:30 o'clock, 7 cm from the left nipple.  Ultrasound-guided core needle biopsy with clip placement.  DIGITAL DIAGNOSTIC LEFT MAMMOGRAM  Comparison:  Previous exams  Findings:  Films are performed following ultrasound guided biopsy of an irregular mass at 1:30 o'clock, 7 cm from the left nipple. The InRad ribbon clip is appropriately positioned within the mass.  IMPRESSION: Appropriate clip placement following ultrasound-guided core needle biopsy of a mass at 1:30 o'clock, 7 cm from the left nipple.  Original Report Authenticated By: Daryl Eastern, M.D.   Mm Digital Diagnostic Unilat L  10/05/2011  *RADIOLOGY REPORT*  Clinical Data:  The patient returns for evaluation of a possible mass in the left upper outer quadrant noted on recent screening study dated 09/22/2011 from [facility].  DIGITAL DIAGNOSTIC LEFT MAMMOGRAM  AND LEFT BREAST ULTRASOUND:  Comparison:  10/24/2009 and 07/04/2008 from [facility]  Findings:  Additional views confirm the presence of an irregular mass in the left upper outer quadrant posteriorly.  On physical exam, no mass is palpated in the.  Ultrasound is performed, showing an irregular solid mass at 1:30 o'clock 7 cm from the left nipple measuring 1.2 x 1.1 x 1.0 cm.  No abnormal  lymph nodes are noted in the left axilla. The appearance is highly suspicious for invasive mammary carcinoma.  Biopsy is recommended.  IMPRESSION: Highly suspicious irregular mass at 1:30 o'clock, 7 cm from the left nipple.  RECOMMENDATION: Ultrasound-guided core needle biopsy is suggested.  This will be performed and reported separately.  BI-RADS CATEGORY 5:  Highly suggestive of malignancy - appropriate action should be taken.  Original Report Authenticated By: Daryl Eastern, M.D.      IMPRESSION: T1cN0  Invasive Ductal Cancer of the left breast as well as a subareolar  papilloma   PLAN: I spoke to Ms. Vangilder and her husband today regarding her diagnosis and options for treatment. We discussed that she should continue making healthy choices and we discussed the role of healthy living in meeting in decreasing cancer recurrence. We discussed that this is a low-grade lesion in his not the same as her mother's inflammatory breast cancer. I think in her mind she understands this plan and her heart she worries that this could develop a more aggressive phenotype. We discussed that the treatment of this could be a lumpectomy and she can have a lumpectomy of the intraductal papilloma as well. We discussed this may leave a poor cosmetic outcome and she may want to consider mastectomy not for therapeutic options but for her cosmetic reasons. We discussed that an Oncotype test to be sent to determine her need for chemotherapy. We discussed that if she did undergo mastectomy I did not see a need for radiation. If she does elect to have a double lumpectomy we discussed the role of radiation in decreasing local recurrence. We discussed the use of breath hold to decrease heart dose. We discussed the process of simulation the placement tattoos. We discussed side effects of treatment including but not limited to fatigue and skin redness. At this point I think she is quite overwhelmed and just needs to be seen by genetic  counselor given her family history. She does have 1 sister but no aunts and is unaware of any other breast cancer in her family. I will plan on seeing her back after surgery if she does undergo a lumpectomy. If she ultimately decide to have a mastectomy I would only need to see her back for consideration of postmastectomy radiation in the event of multiple positive lymph nodes or a greater than 5 cm tumor. She has no with grateful for the care she received year. She met a member of our patient family support staff as well as with our physical therapist and breast cancer navigator.  I spent 60 minutes  face to face with the patient and more than 50% of that time was spent in counseling and/or coordination of care.   ------------------------------------------------  Lurline Hare, MD

## 2011-10-14 ENCOUNTER — Encounter: Payer: Self-pay | Admitting: *Deleted

## 2011-10-14 ENCOUNTER — Ambulatory Visit (HOSPITAL_BASED_OUTPATIENT_CLINIC_OR_DEPARTMENT_OTHER): Payer: 59 | Admitting: Genetic Counselor

## 2011-10-14 ENCOUNTER — Other Ambulatory Visit: Payer: 59 | Admitting: Lab

## 2011-10-14 ENCOUNTER — Encounter: Payer: Self-pay | Admitting: Genetic Counselor

## 2011-10-14 DIAGNOSIS — Z803 Family history of malignant neoplasm of breast: Secondary | ICD-10-CM

## 2011-10-14 DIAGNOSIS — C50419 Malignant neoplasm of upper-outer quadrant of unspecified female breast: Secondary | ICD-10-CM

## 2011-10-14 DIAGNOSIS — IMO0002 Reserved for concepts with insufficient information to code with codable children: Secondary | ICD-10-CM

## 2011-10-14 NOTE — Progress Notes (Signed)
Dr.  Donnie Coffin requested a consultation for genetic counseling and risk assessment for Jennifer Walker, a 56 y.o. female, for discussion of her breast cancer. She presents to clinic today to discuss the possibility of a genetic predisposition to cancer, and to further clarify her risks, as well as her family members' risks for cancer.   HISTORY OF PRESENT ILLNESS: In July 2013, at the age of 69, [name] was diagnosed with breast cancer.    Past Medical History  Diagnosis Date  . Breast cancer   . Insomnia   . H/O colonoscopy 2003  . Wears glasses   . Fatigue   . Psoriasis   . Hot flashes     Past Surgical History  Procedure Date  . Knee surgery 2005, 2007    History  Substance Use Topics  . Smoking status: Never Smoker   . Smokeless tobacco: Not on file  . Alcohol Use: 3.6 oz/week    6 Glasses of wine per week    REPRODUCTIVE HISTORY AND PERSONAL RISK ASSESSMENT FACTORS: Menarche was at age 67.   Menopause at 52 Uterus Intact: Yes Ovaries Intact: Yes G3P2A1 , first live birth at age 30  She has not previously undergone treatment for infertility.   OCP use for 11-12 years   She has not used HRT in the past.    FAMILY HISTORY:  We obtained a detailed, 4-generation family history.  Significant diagnoses are listed below: Family History  Problem Relation Age of Onset  . Breast cancer Mother 47    Inflammatory breast cancer  . Heart attack Father   The patient was diagnsoed with breast cancer at age 61.  She has one sister and one brother, both are healthy.  Her mother was diagnosed with inflammatory breast cancer at age 20 and died at age 68.  The patient has three maternal uncles, all of whom had boys.  There is no reported cancer history in these individuals.  The patient's maternal grandmother died in her early 49s in [place] during WWII for unknown reasons.  There is no other family history of cancer on either side of the family.  Patient's maternal  ancestors are of [place] descent, and paternal ancestors are of [place] and [place] descent. There is no reported Ashkenazi Jewish ancestry. There is no  known consanguinity.  GENETIC COUNSELING RISK ASSESSMENT, DISCUSSION, AND SUGGESTED FOLLOW UP: We reviewed the natural history and genetic etiology of sporadic, familial and hereditary cancer syndromes.  About 5-10% of breast cancer is hereditary.  Of this, about 85% is the result of a BRCA1 or BRCA2 mutation.  We reviewed the red flags of hereditary cancer syndromes and the dominant inheritance patterns.  Based on her limited family history on her mother's side of the family we will pursue genetic testing.  The patient's personal history is suggestive of the following possible diagnosis: hereditary breast cancer vs. Familial breast cancer.  We discussed that identification of a hereditary cancer syndrome may help her care providers tailor the patients medical management. If a mutation indicating a hereditary breast cancer syndrome is detected in this case, the [employer] recommendations would include increased cancer surveillance and possible prophylactic surgery. If a mutation is detected, the patient will be referred back to the referring provider and to any additional appropriate care providers to discuss the relevant options.   If a mutation is not found in the patient, this will decrease the likelihood of hereditary breast cancer as the  explanation for her breast cancer. Cancer surveillance options would be discussed for the patient according to the appropriate standard National Comprehensive Cancer Network and American Cancer Society guidelines, with consideration of their personal and family history risk factors. In this case, the patient will be referred back to their care providers for discussions of management.   In order to estimate her chance of having a BRCA1 or BRCA2 mutation, we used statistical models  (Penn II) and laboratory data that take into account her personal medical history, family history and ancestry.  Because each model is different, there can be a lot of variability in the risks they give.  Therefore, these numbers must be considered a rough range and not a precise risk of having a BRCA1 or BRCA2 mutation.  These models estimate that she has approximately a 7% chance of having a mutation.   After considering the risks, benefits, and limitations, the patient provided informed consent for  the following  testing: BRACAnalysis through [employer] [facility].   Per the patient's request, we will contact her by telephone to discuss these results. A follow up genetic counseling visit will be scheduled if indicated.  The patient was seen for a total of 60 minutes, greater than 50% of which was spent face-to-face counseling.  This plan is being carried out per Dr. Renelda Loma recommendations.  This note will also be sent to the referring provider via the electronic medical record. The patient will be supplied with a summary of this genetic counseling discussion as well as educational information on the discussed hereditary cancer syndromes following the conclusion of their visit.   Patient was discussed with Dr. [name] Second.   _______________________________________________________________________ For Office Staff:  Number of people involved in session: 2 Was an Intern/ student involved with case: not applicable

## 2011-10-14 NOTE — Progress Notes (Signed)
[facility] Psychosocial Distress Screening Clinical Social Work  Pt completed the distress screening protocol, and scored a 9 on the Psychosocial Distress Thermometer which indicates severe distress. Patient and family support team member met with patient in the exam room during [facility] to assess for distress and other psychosocial needs.  Patient was informed of supportive services at [facility] and provided appropriate interventions.      Clinical Social Worker follow up needed:not at this time  Tamala Julian, MSW, LCSW Clinical Social Worker [facility] [phone]      :

## 2011-10-15 ENCOUNTER — Telehealth: Payer: Self-pay | Admitting: *Deleted

## 2011-10-15 ENCOUNTER — Encounter: Payer: Self-pay | Admitting: *Deleted

## 2011-10-15 ENCOUNTER — Telehealth (INDEPENDENT_AMBULATORY_CARE_PROVIDER_SITE_OTHER): Payer: Self-pay

## 2011-10-15 ENCOUNTER — Other Ambulatory Visit (INDEPENDENT_AMBULATORY_CARE_PROVIDER_SITE_OTHER): Payer: Self-pay | Admitting: Surgery

## 2011-10-15 ENCOUNTER — Encounter (HOSPITAL_COMMUNITY): Payer: Self-pay

## 2011-10-15 DIAGNOSIS — C50919 Malignant neoplasm of unspecified site of unspecified female breast: Secondary | ICD-10-CM

## 2011-10-15 NOTE — Progress Notes (Signed)
Mailed after appt letter to pt. 

## 2011-10-15 NOTE — Telephone Encounter (Signed)
patient confirmed over the phone the new date and time on 11-24-2011 at 12:30pm

## 2011-10-15 NOTE — Telephone Encounter (Signed)
Pt calling in concerned about 2 red bumps near the bx site on the breast w/little blisters at the steri strips. The pt removed the steri strips today. I advised the pt that it sounded like she was having a reaction from the bandage or steri strips and she she did the right thing about removing the steri strips. The pt just needed to keep the area open to the air and not to put any tape on the skin. The pt is ok after talking to [place]. The pt is also upset about her surgery date being scheduled for the 8/8 b/c she had spoke to Dr Ezzard Standing about her having a vacation planned for 8/10 paid trip to [facility]. The pt was under the understanding that her surgery would get scheduled for the beginning of August. I spoke to Huntley Dec Dr [facility] nurse b/c Dr Ezzard Standing is gone for 2 wks now. Huntley Dec is going to get one of the surgery schedulers to look at this and call pt back. The pt understands she will get a call back from our office.

## 2011-10-19 ENCOUNTER — Other Ambulatory Visit: Payer: 59

## 2011-10-21 ENCOUNTER — Telehealth (INDEPENDENT_AMBULATORY_CARE_PROVIDER_SITE_OTHER): Payer: Self-pay

## 2011-10-21 NOTE — Telephone Encounter (Signed)
The patient called and stated she has decided she would like to switch Oncologists from Dr Donnie Coffin to Dr Darnelle Catalan.  She did not feel very comfortable with Dr Donnie Coffin.  I told her I will let the Breast Cancer Navigators know so they could make a new appointment.  I did not think it will be a problem

## 2011-10-25 ENCOUNTER — Encounter (HOSPITAL_COMMUNITY): Payer: Self-pay

## 2011-10-25 ENCOUNTER — Encounter (HOSPITAL_COMMUNITY)
Admission: RE | Admit: 2011-10-25 | Discharge: 2011-10-25 | Disposition: A | Discharge status: Home / Self Care | Payer: 59 | Source: Ambulatory Visit | Attending: Surgery | Admitting: Surgery

## 2011-10-25 HISTORY — DX: Other specified postprocedural states: Z98.890

## 2011-10-25 HISTORY — DX: Nausea with vomiting, unspecified: R11.2

## 2011-10-25 LAB — CBC
Hemoglobin: 12.9 g/dL (ref 12.0–15.0)
MCHC: 34.3 g/dL (ref 30.0–36.0)
WBC: 3.7 10*3/uL — ABNORMAL LOW (ref 4.0–10.5)

## 2011-10-25 LAB — SURGICAL PCR SCREEN
MRSA, PCR: NEGATIVE
Staphylococcus aureus: NEGATIVE

## 2011-10-25 NOTE — Pre-Procedure Instructions (Signed)
22 Jennifer Walker  10/25/2011   Your procedure is scheduled on:  August 5th  Report to [facility] at 0800 AM.  Call this number if you have problems the morning of surgery: [phone]   Remember:   Do not eat food or drink:After Midnight.  Take these medicines the morning of surgery with A SIP OF WATER: none   Do not wear jewelry, make-up or nail polish.  Do not wear lotions, powders, or perfumes.   Do not shave 48 hours prior to surgery. Men may shave face and neck.  Do not bring valuables to the hospital.  Contacts, dentures or bridgework may not be worn into surgery.  Leave suitcase in the car. After surgery it may be brought to your room.  For patients admitted to the hospital, checkout time is 11:00 AM the day of discharge.   Patients discharged the day of surgery will not be allowed to drive home.  Special Instructions: CHG Shower Use Special Wash: 1/2 bottle night before surgery and 1/2 bottle morning of surgery.   Please read over the following fact sheets that you were given: Pain Booklet, Coughing and Deep Breathing, MRSA Information and Surgical Site Infection Prevention

## 2011-10-25 NOTE — Progress Notes (Signed)
Primary Physician - Dr. Timothy Lasso Does not have a cardiologist Has not had a EKG, Echo, Stress Test, or Cardiac Cath.

## 2011-10-27 ENCOUNTER — Telehealth: Payer: Self-pay | Admitting: *Deleted

## 2011-10-27 NOTE — Telephone Encounter (Signed)
Confirmed 11/23/11 appt w/ pt.  Emailed Huntley Dec at [facility] to make aware.  Took paperwork to Med Rec for chart.

## 2011-10-31 MED ORDER — CEFAZOLIN SODIUM-DEXTROSE 2-3 GM-% IV SOLR
2.0000 g | INTRAVENOUS | Status: AC
  Administered 2011-11-01: 2 g via INTRAVENOUS
  Filled 2011-10-31: qty 50

## 2011-11-01 ENCOUNTER — Ambulatory Visit
Admission: RE | Admit: 2011-11-01 | Discharge: 2011-11-01 | Disposition: A | Discharge status: Home / Self Care | Payer: 59 | Source: Ambulatory Visit | Attending: Surgery | Admitting: Surgery

## 2011-11-01 ENCOUNTER — Other Ambulatory Visit (INDEPENDENT_AMBULATORY_CARE_PROVIDER_SITE_OTHER): Payer: Self-pay | Admitting: Surgery

## 2011-11-01 ENCOUNTER — Ambulatory Visit (HOSPITAL_COMMUNITY)
Admission: RE | Admit: 2011-11-01 | Discharge: 2011-11-01 | Disposition: A | Discharge status: Home / Self Care | Payer: 59 | Source: Ambulatory Visit | Attending: Surgery | Admitting: Surgery

## 2011-11-01 ENCOUNTER — Encounter (HOSPITAL_COMMUNITY)
Admission: RE | Disposition: A | Discharge status: Home / Self Care | Payer: Self-pay | Source: Ambulatory Visit | Attending: Surgery

## 2011-11-01 ENCOUNTER — Encounter (HOSPITAL_COMMUNITY): Payer: Self-pay | Admitting: Certified Registered Nurse Anesthetist

## 2011-11-01 ENCOUNTER — Ambulatory Visit (HOSPITAL_COMMUNITY): Payer: 59 | Admitting: Certified Registered Nurse Anesthetist

## 2011-11-01 DIAGNOSIS — D249 Benign neoplasm of unspecified breast: Secondary | ICD-10-CM

## 2011-11-01 DIAGNOSIS — Z170 Estrogen receptor positive status [ER+]: Secondary | ICD-10-CM | POA: Insufficient documentation

## 2011-11-01 DIAGNOSIS — D0590 Unspecified type of carcinoma in situ of unspecified breast: Secondary | ICD-10-CM | POA: Insufficient documentation

## 2011-11-01 DIAGNOSIS — C50419 Malignant neoplasm of upper-outer quadrant of unspecified female breast: Secondary | ICD-10-CM

## 2011-11-01 DIAGNOSIS — Z01812 Encounter for preprocedural laboratory examination: Secondary | ICD-10-CM | POA: Insufficient documentation

## 2011-11-01 DIAGNOSIS — N6019 Diffuse cystic mastopathy of unspecified breast: Secondary | ICD-10-CM

## 2011-11-01 DIAGNOSIS — C50919 Malignant neoplasm of unspecified site of unspecified female breast: Secondary | ICD-10-CM

## 2011-11-01 DIAGNOSIS — L408 Other psoriasis: Secondary | ICD-10-CM | POA: Insufficient documentation

## 2011-11-01 HISTORY — PX: BREAST LUMPECTOMY: SHX2

## 2011-11-01 SURGERY — BREAST LUMPECTOMY WITH NEEDLE LOCALIZATION AND AXILLARY SENTINEL LYMPH NODE BX
Anesthesia: General | Site: Chest | Laterality: Left | Wound class: Clean

## 2011-11-01 MED ORDER — LIDOCAINE HCL (CARDIAC) 20 MG/ML IV SOLN
INTRAVENOUS | Status: DC | PRN
  Administered 2011-11-01: 100 mg via INTRAVENOUS

## 2011-11-01 MED ORDER — METHYLENE BLUE 1 % INJ SOLN
INTRAMUSCULAR | Status: AC
  Filled 2011-11-01: qty 10

## 2011-11-01 MED ORDER — HYDROMORPHONE HCL PF 1 MG/ML IJ SOLN
0.2500 mg | INTRAMUSCULAR | Status: DC | PRN
  Administered 2011-11-01: 0.5 mg via INTRAVENOUS

## 2011-11-01 MED ORDER — FENTANYL CITRATE 0.05 MG/ML IJ SOLN
50.0000 ug | INTRAMUSCULAR | Status: DC | PRN
  Administered 2011-11-01: 100 ug via INTRAVENOUS

## 2011-11-01 MED ORDER — MIDAZOLAM HCL 2 MG/2ML IJ SOLN
INTRAMUSCULAR | Status: AC
  Filled 2011-11-01: qty 2

## 2011-11-01 MED ORDER — PROMETHAZINE HCL 25 MG/ML IJ SOLN: 6.2500 mg | INTRAMUSCULAR | Status: DC | PRN

## 2011-11-01 MED ORDER — ONDANSETRON HCL 4 MG/2ML IJ SOLN
INTRAMUSCULAR | Status: DC | PRN
  Administered 2011-11-01: 4 mg via INTRAVENOUS

## 2011-11-01 MED ORDER — TECHNETIUM TC 99M SULFUR COLLOID FILTERED
1.0000 | Freq: Once | INTRAVENOUS | Status: AC | PRN
  Administered 2011-11-01: 1 via INTRADERMAL

## 2011-11-01 MED ORDER — HYDROCODONE-ACETAMINOPHEN 5-325 MG PO TABS: 1.0000 | ORAL_TABLET | Freq: Four times a day (QID) | ORAL | Status: AC | PRN

## 2011-11-01 MED ORDER — BUPIVACAINE HCL (PF) 0.25 % IJ SOLN
INTRAMUSCULAR | Status: AC
  Filled 2011-11-01: qty 30

## 2011-11-01 MED ORDER — SCOPOLAMINE 1 MG/3DAYS TD PT72
MEDICATED_PATCH | TRANSDERMAL | Status: DC | PRN
  Administered 2011-11-01: 1 via TRANSDERMAL

## 2011-11-01 MED ORDER — CHLORHEXIDINE GLUCONATE 4 % EX LIQD: 1.0000 "application " | Freq: Once | CUTANEOUS | Status: DC

## 2011-11-01 MED ORDER — 0.9 % SODIUM CHLORIDE (POUR BTL) OPTIME
TOPICAL | Status: DC | PRN
  Administered 2011-11-01: 1000 mL

## 2011-11-01 MED ORDER — EPHEDRINE SULFATE 50 MG/ML IJ SOLN
INTRAMUSCULAR | Status: DC | PRN
  Administered 2011-11-01: 5 mg via INTRAVENOUS
  Administered 2011-11-01: 10 mg via INTRAVENOUS
  Administered 2011-11-01: 5 mg via INTRAVENOUS

## 2011-11-01 MED ORDER — SODIUM CHLORIDE 0.9 % IJ SOLN
INTRAMUSCULAR | Status: DC | PRN
  Administered 2011-11-01: 10:00:00

## 2011-11-01 MED ORDER — LACTATED RINGERS IV SOLN
INTRAVENOUS | Status: DC
  Administered 2011-11-01: 10:00:00 via INTRAVENOUS

## 2011-11-01 MED ORDER — MIDAZOLAM HCL 2 MG/2ML IJ SOLN
1.0000 mg | INTRAMUSCULAR | Status: DC | PRN
  Administered 2011-11-01: 2 mg via INTRAVENOUS

## 2011-11-01 MED ORDER — FENTANYL CITRATE 0.05 MG/ML IJ SOLN
INTRAMUSCULAR | Status: AC
  Filled 2011-11-01: qty 2

## 2011-11-01 MED ORDER — LACTATED RINGERS IV SOLN
INTRAVENOUS | Status: DC | PRN
  Administered 2011-11-01 (×2): via INTRAVENOUS

## 2011-11-01 MED ORDER — HYDROMORPHONE HCL PF 1 MG/ML IJ SOLN
INTRAMUSCULAR | Status: AC
  Filled 2011-11-01: qty 1

## 2011-11-01 MED ORDER — FENTANYL CITRATE 0.05 MG/ML IJ SOLN
INTRAMUSCULAR | Status: DC | PRN
  Administered 2011-11-01: 100 ug via INTRAVENOUS

## 2011-11-01 MED ORDER — PROPOFOL 10 MG/ML IV BOLUS
INTRAVENOUS | Status: DC | PRN
  Administered 2011-11-01: 180 mg via INTRAVENOUS

## 2011-11-01 MED ORDER — DEXAMETHASONE SODIUM PHOSPHATE 10 MG/ML IJ SOLN
INTRAMUSCULAR | Status: DC | PRN
  Administered 2011-11-01: 10 mg via INTRAVENOUS

## 2011-11-01 MED ORDER — BUPIVACAINE HCL (PF) 0.25 % IJ SOLN
INTRAMUSCULAR | Status: DC | PRN
  Administered 2011-11-01: 30 mL

## 2011-11-01 MED ORDER — SCOPOLAMINE 1 MG/3DAYS TD PT72
1.0000 | MEDICATED_PATCH | Freq: Once | TRANSDERMAL | Status: DC
  Administered 2011-11-01: 1.5 mg via TRANSDERMAL

## 2011-11-01 SURGICAL SUPPLY — 44 items
ADH SKN CLS APL DERMABOND .7 (GAUZE/BANDAGES/DRESSINGS) ×1
APPLIER CLIP 9.375 MED OPEN (MISCELLANEOUS)
APPLIER CLIP 9.375 SM OPEN (CLIP)
APR CLP MED 9.3 20 MLT OPN (MISCELLANEOUS)
BINDER BREAST LRG (GAUZE/BANDAGES/DRESSINGS) IMPLANT
BINDER BREAST XLRG (GAUZE/BANDAGES/DRESSINGS) ×2 IMPLANT
CANISTER SUCTION 2500CC (MISCELLANEOUS) ×2 IMPLANT
CHLORAPREP W/TINT 26ML (MISCELLANEOUS) ×2 IMPLANT
CLIP APPLIE 9.375 MED OPEN (MISCELLANEOUS) IMPLANT
CLIP APPLIE 9.375 SM OPEN (CLIP) IMPLANT
CLIP TI WIDE RED SMALL 6 (CLIP) ×2 IMPLANT
CLOTH BEACON ORANGE TIMEOUT ST (SAFETY) ×2 IMPLANT
CONT SPEC 4OZ CLIKSEAL STRL BL (MISCELLANEOUS) ×2 IMPLANT
COVER PROBE W GEL 5X96 (DRAPES) ×2 IMPLANT
COVER SURGICAL LIGHT HANDLE (MISCELLANEOUS) ×2 IMPLANT
DERMABOND ADVANCED (GAUZE/BANDAGES/DRESSINGS) ×1
DERMABOND ADVANCED .7 DNX12 (GAUZE/BANDAGES/DRESSINGS) ×1 IMPLANT
DEVICE DUBIN SPECIMEN MAMMOGRA (MISCELLANEOUS) ×4 IMPLANT
DRAPE CHEST BREAST 15X10 FENES (DRAPES) ×2 IMPLANT
DRAPE PROXIMA HALF (DRAPES) ×2 IMPLANT
DRAPE UTILITY 15X26 W/TAPE STR (DRAPE) ×4 IMPLANT
ELECT COATED BLADE 2.86 ST (ELECTRODE) ×2 IMPLANT
ELECT REM PT RETURN 9FT ADLT (ELECTROSURGICAL) ×2
ELECTRODE REM PT RTRN 9FT ADLT (ELECTROSURGICAL) ×1 IMPLANT
GLOVE BIOGEL PI IND STRL 7.0 (GLOVE) ×2 IMPLANT
GLOVE BIOGEL PI INDICATOR 7.0 (GLOVE) ×2
GLOVE SURG SIGNA 7.5 PF LTX (GLOVE) ×6 IMPLANT
GLOVE SURG SS PI 7.0 STRL IVOR (GLOVE) ×4 IMPLANT
GOWN STRL NON-REIN LRG LVL3 (GOWN DISPOSABLE) ×4 IMPLANT
GOWN STRL REIN XL XLG (GOWN DISPOSABLE) ×2 IMPLANT
KIT BASIN OR (CUSTOM PROCEDURE TRAY) ×2 IMPLANT
KIT MARKER MARGIN INK (KITS) ×2 IMPLANT
KIT ROOM TURNOVER OR (KITS) ×2 IMPLANT
NEEDLE 18GX1X1/2 (RX/OR ONLY) (NEEDLE) ×2 IMPLANT
NEEDLE HYPO 25GX1X1/2 BEV (NEEDLE) ×4 IMPLANT
NS IRRIG 1000ML POUR BTL (IV SOLUTION) ×2 IMPLANT
PACK GENERAL/GYN (CUSTOM PROCEDURE TRAY) ×2 IMPLANT
PAD ARMBOARD 7.5X6 YLW CONV (MISCELLANEOUS) ×2 IMPLANT
STAPLER VISISTAT 35W (STAPLE) IMPLANT
SUT MON AB 5-0 PS2 18 (SUTURE) ×4 IMPLANT
SUT VIC AB 3-0 SH 18 (SUTURE) ×2 IMPLANT
SYR CONTROL 10ML LL (SYRINGE) ×4 IMPLANT
TOWEL OR 17X24 6PK STRL BLUE (TOWEL DISPOSABLE) ×2 IMPLANT
TOWEL OR 17X26 10 PK STRL BLUE (TOWEL DISPOSABLE) ×2 IMPLANT

## 2011-11-01 NOTE — Preoperative (Signed)
Beta Blockers   Reason not to administer Beta Blockers:Not Applicable 

## 2011-11-01 NOTE — Anesthesia Procedure Notes (Signed)
Procedure Name: LMA Insertion Date/Time: 11/01/2011 10:00 AM Performed by: Rogelia Boga Pre-anesthesia Checklist: Patient identified, Emergency Drugs available, Suction available, Patient being monitored and Timeout performed Patient Re-evaluated:Patient Re-evaluated prior to inductionOxygen Delivery Method: Circle system utilized Preoxygenation: Pre-oxygenation with 100% oxygen Intubation Type: IV induction Ventilation: Mask ventilation without difficulty LMA: LMA inserted LMA Size: 4.0 Tube type: Oral Number of attempts: 1 Placement Confirmation: positive ETCO2 and breath sounds checked- equal and bilateral Tube secured with: Tape Comments: Loose and chipped left front cap intact after LMA placement

## 2011-11-01 NOTE — H&P (View-Only) (Signed)
Re:   Jennifer Walker DOB:   06-08-1955 MRN:   [id number]  ASSESSMENT AND PLAN: 1.  Left breast cancer, OUQ  1.9 cm on MRI  IDC, ER - 100%, PR - 39 %, Ki67 - 8%, and Her2Neu - neg.  I discussed the options for breast cancer treatment with the patient.  I discussed the idea of a multidisciplinary approach to the treatment of breast cancer, which includes medical oncology and radiation oncology.  I discussed the surgical options of lumpectomy vs. mastectomy.  I discussed the options of lymph node biopsy.  The treatment plan depends on the pathologic staging of the tumor and the patient's personal wishes.  The risks of surgery include, but are not limited to, bleeding, infection, the need for further surgery, and nerve injury.  The patient has been given literature on the treatment of breast cancer.  I spent 45 minutes talking to patient and husband.  Plan:  Left breast wire loc lumpectomy x 2 (one area is benign), left axillary SLNBx - we will plan to do the surgery the first week of August (she understands that I am on vacation for 2 weeks). We went back and forth with the patient about timing and time of treatment.  I spent about 1 hour talking to patient and husband.   She sees the geneticist tomorrow.  She would like the BRCA results before surgery, but we will see.   Dr. Donnie Coffin plans an Oncotype to guide therapy.  Post op radiation by Dr. Michell Heinrich.  2.  Papilloma, LOQ  Will excise (using wire loc) at same time as primary cancer 3.  Psoriasis  REFERRING PHYSICIAN:  Duane Lope ([facility]), M.D.  HISTORY OF PRESENT ILLNESS: Jennifer Walker is a 56 y.o. (DOB: 20-Nov-1955)  white female whose primary care physician is Creola Corn (though she has not seen him in 5 years) and comes to me today for left breast cancer.  Mother died of inflammatory breast cancer int he 26's.  She had a right breast biopsy and right axillary node biopsy at age 56.  She had not had a mammogram in 2 years.  She has  been under a lot of stress the last 3 years in that both her husband's parents died in those 3 years and she was the primary caregiver.  She is now looking at trying to get in better shape.  She went through menopause 4 years ago and this has added to her stress.  She has insomnia and depression.  She has refused to take hormonal replacement therapy.  She did have a right breast biopsy in 1978 which was benign.  She had a recent mammogram which prompted a core biopsy of her left breast.  The biopsy showed IDC.  The MRI on 10/08/2011 showed a 1.9 cm enhancing mass in the UOQ of the left breast.  This correlates with her primary breast cancer.  She had a second lesion seen on MRI in the LOQ of the left breast.  Biopsy of this showed a papilloma (the typed report is pending).    Past Medical History  Diagnosis Date  . Breast cancer   . Insomnia   . H/O colonoscopy 2003  . Wears glasses   . Fatigue   . Psoriasis   . Hot flashes       Past Surgical History  Procedure Date  . Knee surgery 2005, 2007      Current Outpatient Prescriptions  Medication Sig Dispense Refill  .  zolpidem (AMBIEN) 10 MG tablet Take 10 mg by mouth at bedtime as needed.          Allergies  Allergen Reactions  . Sulfa Antibiotics   . Sulfa Drugs Cross Reactors     REVIEW OF SYSTEMS: Skin:  Psoriasis. Infection:  No history of hepatitis or HIV.  No history of MRSA. Neurologic:  No history of stroke.  No history of seizure.  No history of headaches. Cardiac:  No history of hypertension. No history of heart disease.  No history of seeing a cardiologist. Pulmonary:  Does not smoke cigarettes.  No asthma or bronchitis.  No OSA/CPAP.  Endocrine:  No diabetes. No thyroid disease. Gastrointestinal:  Colonoscopy about 10 years ago for rectal bleeding.  Unknown gastroenterologist. Urologic:  No history of kidney stones.  No history of bladder infections. Musculoskeletal:  No history of joint or back  disease. Hematologic:  No bleeding disorder.  No history of anemia.  Not anticoagulated. Psycho-social:  The patient is oriented.   The patient has no obvious psychologic or social impairment to understanding our conversation and plan.  SOCIAL and FAMILY HISTORY: Married.  Her husband is with her. Does not work.  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 She has two children - 13 and 15. She stressed over and over about living to be 55 years old.  PHYSICAL EXAM: There were no vitals taken for this visit.  General: WN WF who is alert and generally healthy appearing.  HEENT: Normal. Pupils equal. Neck: Supple. No mass.  No thyroid mass. Lymph Nodes:  No supraclavicular, cervical, or axillary nodes. Lungs: Clear to auscultation and symmetric breath sounds. Heart:  RRR. No murmur or rub. Breast:  Left - bruise in UOQ.  I may feel a mass effect, but I am not sure.  Right - No mass.  Abdomen: Soft. No mass. No tenderness. No hernia. Normal bowel sounds.  No abdominal scars. Rectal: Not done. Extremities:  Good strength and ROM  in upper and lower extremities. Neurologic:  Grossly intact to motor and sensory function. Psychiatric: Has normal mood and affect. Behavior is normal.   DATA REVIEWED: Path report to patient and radiology studies. CA 27.29 - 11 (normal) - 10/13/2011  Ovidio Kin, MD,  [facility] [address] [place]  [address]   [place], [place]    [address] Phone:  [phone] FAX:  [phone]

## 2011-11-01 NOTE — Op Note (Signed)
NAMEPARKER, Jennifer Walker NO.:  [id number]  MEDICAL RECORD NO.:  [id number]  LOCATION:  [facility]                         FACILITY:  [facility]  PHYSICIAN:  Sandria Bales. Ezzard Standing, M.D.  DATE OF BIRTH:  1955/11/05  DATE OF PROCEDURE:  11/01/2011                              OPERATIVE REPORT   PREOPERATIVE DIAGNOSIS:  Left breast cancer at 2 o'clock position, left breast papilloma at 6 o'clock position.  POSTOPERATIVE DIAGNOSIS:  Left breast cancer at 2 o'clock position (T1, N0), left breast papilloma at 6 o'clock position.  PROCEDURE:  Left breast lumpectomy x2 using wire localization and left axillary sentinel lymph node biopsy.  FIRST ASSISTANT:  None.  ANESTHESIA:  General, supervised by Dr. Bedelia Person, MD.  Local anesthesia was 30 mL of 0.25% Marcaine.  INDICATION FOR PROCEDURE:  Jennifer Walker is a 56 year old white female who is a patient of Dr. Creola Corn who was seen through the [facility] for a newly diagnosed left breast cancer.  PLAN:  Options were discussed with the patient.  She elected to proceed with lumpectomy and left axillary sentinel lymph node.  In her workup, she had an MRI which showed a 2nd lesion in the left breast below the areola.  A biopsy that showed this to be a papilloma, but the plan was to excise this area, at the same time we did a primary cystectomy for the cancer.  The indications and potential complications of surgery were explained to the patient.  Potential complications include, but are not limited to, bleeding, infection, the need for further surgery, and nerve injury.  OPERATIVE NOTE:  The patient had 2 wires placed in her left breast.  One wire coming out about at 2 o'clock position and the other wire coming at about the 5 o'clock position at the Breast Center.  She presented to the holding area at the [facility] OR where her breast was injected with 1 millicuries of technetium sulfur colloid.  She then went to room #1  where she underwent a general anesthesia supervised by Dr. Bedelia Person.  Her left breast was prepped with ChloraPrep and sterilely draped.    A time-out and the surgical check list was reviewed.  Before the procedure, I injected her left areola with about 0.5 mL of 40% methylene blue.  I started with the sentinel lymph node.  I found a hot area at the junction of the breast, the pectoralis major muscle.  I cut down and identified a hot node that had counts of about 150 and the background has 0 counts.  There was no blue dye in  the node.  I checked her internal mammary nodes and supraclavicular nodes with the neoprobe and found no other hot area.  The axillary node was then sent to pathology.    I next did the excision of the benign papilloma at the 6 o'clock position of the left breast.  I did a circumareolar incisions.  This appeared to be lying at the inferior edge of the areola.  I excised at approximately 3 x 4 cm piece of breast tissue.  I did paint the specimen with a 6-color paint kit in case if it turned out to be  malignant.  I then did a specimen mammogram confirming the clip and the wire in the middle of the specimen and this was sent to Pathology.  I turned attention to the cancer which was about at the 2-3 o'clock position of the left breast.  I cut it down and tried to take an ellipse around the tumor by at least 1 cm.  The tumor measured about 1.9 cm on preop radiology studies.  I excised this block of breast tissue approximately 6 cm in diameter.  I did take the dissection done to the pectoralis major.  I painted this specimen with a paint kit and did a specimen mammogram which confirmed the mass, clip and the wire were all in the right position.  I then irrigated each wound with saline.  I infiltrated approximately 30 mL of 1% local between the 3 incisions.  I placed 6 clips in the cancer of biopsy cavity, at 12, 3, 6, and 9 o'clock.  Two clips were placed on the  pectoralis major.  I then closed all the wounds in layers using 3-0 Vicryl sutures in the deep layer until the skin.  At the skin, I closed the incisions with a 5-0 Monocryl suture.  The patient tolerated the procedure well, was transported to the recovery room in good condition.  Sponge and needle count were correct at the end of the case.   Sandria Bales. Ezzard Standing, M.D., FACS   DHN/MEDQ  D:  11/01/2011  T:  11/01/2011  Job:  [id number]  cc:   Miguel Aschoff, M.D. Gwen Pounds, MD Pierce Crane, M.D., F.R.C.P.C. Lurline Hare, M.D.

## 2011-11-01 NOTE — Anesthesia Postprocedure Evaluation (Signed)
  Anesthesia Post-op Note  Patient: Jennifer Walker  Procedure(s) Performed: Procedure(s) (LRB): BREAST LUMPECTOMY WITH NEEDLE LOCALIZATION AND AXILLARY SENTINEL LYMPH NODE BX (Left)  Patient Location: PACU  Anesthesia Type: General  Level of Consciousness: awake and alert   Airway and Oxygen Therapy: Patient Spontanous Breathing  Post-op Pain: mild  Post-op Assessment: Post-op Vital signs reviewed, Patient's Cardiovascular Status Stable, Respiratory Function Stable, Patent Airway, No signs of Nausea or vomiting and Pain level controlled  Post-op Vital Signs: stable  Complications: No apparent anesthesia complications

## 2011-11-01 NOTE — Transfer of Care (Signed)
Immediate Anesthesia Transfer of Care Note  Patient: Jennifer Walker  Procedure(s) Performed: Procedure(s) (LRB): BREAST LUMPECTOMY WITH NEEDLE LOCALIZATION AND AXILLARY SENTINEL LYMPH NODE BX (Left)  Patient Location: PACU  Anesthesia Type: General  Level of Consciousness: awake, alert , oriented and patient cooperative  Airway & Oxygen Therapy: Patient Spontanous Breathing and Patient connected to nasal cannula oxygen  Post-op Assessment: Report given to PACU RN, Post -op Vital signs reviewed and stable and Patient moving all extremities X 4  Post vital signs: Reviewed and stable  Complications: No apparent anesthesia complications

## 2011-11-01 NOTE — Interval H&P Note (Signed)
History and Physical Interval Note:  11/01/2011 9:43 AM  Jennifer Walker  has presented today for surgery, with the diagnosis of LEFT INVASIVE DUCTAL CARCINOMA  The various methods of treatment have been discussed with the patient and family.  Patient has 2 lesions of left breast, one benign (LOQ) and one malignant (UOQ).  I'm taking out both.  Her husband is here today.  After consideration of risks, benefits and other options for treatment, the patient has consented to  Procedure(s) (LRB): BREAST LUMPECTOMY WITH NEEDLE LOCALIZATION AND AXILLARY SENTINEL LYMPH NODE BX (Left) as a surgical intervention .    The patient's history has been reviewed, patient examined, no change in status, stable for surgery.  I have reviewed the patient's chart and labs.  Questions were answered to the patient's satisfaction.     Tamiko Leopard H

## 2011-11-01 NOTE — Brief Op Note (Signed)
11/01/2011  11:41 AM  PATIENT:  Jennifer Walker, 56 y.o., female, MRN: [id number]  PREOP DIAGNOSIS:  LEFT BREAST INVASIVE DUCTAL CARCINOMA  POSTOP DIAGNOSIS:   Left breast cancer at 2 o'clock (T1,N0), Left breast papilloma at 6 o'clock  PROCEDURE:   Procedure(s): Left BREAST LUMPECTOMY x 2 WITH NEEDLE LOCALIZATION AND Left AXILLARY SENTINEL LYMPH NODE BX  SURGEON:   Ovidio Kin, M.D.  ASSISTANT:   None  ANESTHESIA:   general  Bedelia Person, MD - Anesthesiologist Rogelia Boga, CRNA - CRNA  General  EBL:  <100  ml  BLOOD ADMINISTERED: none  DRAINS: none   LOCAL MEDICATIONS USED:   30 cc 1/4% marcaine  SPECIMEN:   Left breast lumpectomy (UOQ - cancer), Left breast lumpectomy (6 o'clock), and left axillary SLNBx  COUNTS CORRECT:  YES  INDICATIONS FOR PROCEDURE:  [name] is a 56 y.o. (DOB: October 29, 1955) white female whose primary care physician is No primary provider on file. and comes for left breast lumpectomy x 2 and left axillary SLNBx.   The indications and risks of the surgery were explained to the patient.  The risks include, but are not limited to, infection, bleeding, and nerve injury.  Note dictated to:   #[id number]

## 2011-11-01 NOTE — Anesthesia Preprocedure Evaluation (Addendum)
Anesthesia Evaluation  Patient identified by MRN, date of birth, ID band Patient awake    Reviewed: Allergy & Precautions, H&P , NPO status , Patient's Chart, lab work & pertinent test results  History of Anesthesia Complications (+) PONV  Airway Mallampati: I TM Distance: >3 FB Neck ROM: Full    Dental  (+)    Pulmonary    Pulmonary exam normal       Cardiovascular     Neuro/Psych    GI/Hepatic   Endo/Other    Renal/GU      Musculoskeletal   Abdominal (+) + obese,   Peds  Hematology   Anesthesia Other Findings   Reproductive/Obstetrics Breast ca                          Anesthesia Physical Anesthesia Plan  ASA: II  Anesthesia Plan: General   Post-op Pain Management:    Induction: Intravenous  Airway Management Planned: LMA  Additional Equipment:   Intra-op Plan:   Post-operative Plan: Extubation in OR  Informed Consent: I have reviewed the patients History and Physical, chart, labs and discussed the procedure including the risks, benefits and alternatives for the proposed anesthesia with the patient or authorized representative who has indicated his/her understanding and acceptance.     Plan Discussed with: CRNA, Surgeon and Anesthesiologist  Anesthesia Plan Comments:        Anesthesia Quick Evaluation

## 2011-11-01 NOTE — Anesthesia Postprocedure Evaluation (Signed)
  Anesthesia Post-op Note  Patient: Jennifer Walker  Procedure(s) Performed: Procedure(s) (LRB): BREAST LUMPECTOMY WITH NEEDLE LOCALIZATION AND AXILLARY SENTINEL LYMPH NODE BX (Left)  Patient Location: PACU  Anesthesia Type: General  Level of Consciousness: awake and alert   Airway and Oxygen Therapy: Patient Spontanous Breathing  Post-op Pain: mild  Post-op Assessment: Post-op Vital signs reviewed, Patient's Cardiovascular Status Stable, Respiratory Function Stable, Patent Airway, No signs of Nausea or vomiting and Pain level controlled  Post-op Vital Signs: stable  Complications: No apparent anesthesia complications 

## 2011-11-08 ENCOUNTER — Other Ambulatory Visit (HOSPITAL_COMMUNITY): Payer: 59

## 2011-11-10 ENCOUNTER — Other Ambulatory Visit: Payer: Self-pay | Admitting: *Deleted

## 2011-11-10 DIAGNOSIS — C50419 Malignant neoplasm of upper-outer quadrant of unspecified female breast: Secondary | ICD-10-CM

## 2011-11-15 ENCOUNTER — Encounter (INDEPENDENT_AMBULATORY_CARE_PROVIDER_SITE_OTHER): Payer: Self-pay | Admitting: Surgery

## 2011-11-15 ENCOUNTER — Ambulatory Visit (INDEPENDENT_AMBULATORY_CARE_PROVIDER_SITE_OTHER): Payer: 59 | Admitting: Surgery

## 2011-11-15 VITALS — BP 120/78 | HR 64 | Temp 98.0°F | Ht 71.0 in | Wt 211.2 lb

## 2011-11-15 DIAGNOSIS — C50419 Malignant neoplasm of upper-outer quadrant of unspecified female breast: Secondary | ICD-10-CM

## 2011-11-15 NOTE — Progress Notes (Signed)
Re:   Jennifer Walker DOB:   1955/09/02 MRN:   [id number]  [facility]  ASSESSMENT AND PLAN: 1.  Left breast cancer, OUQ    (T1c,N0)  Final path - 1.8 IDC, 0/1 nodes  IDC, ER - 100%, PR - 39 %, Ki67 - 8%, and Her2Neu - neg.  The genetic test cost $4,000 and she has not decided if she is going to do it.  We spent a fair amount of time talking about this.  Dr. Darnelle Catalan plans an Oncotype to guide therapy.  She sees him on 8/27.  Post op radiation by Dr. Michell Heinrich.  She is going to go ahead and make an appointment with her.  Unless something else comes up, I will see her in 6 months.  2.  Papilloma, LOQ.  This is at the inferior edge of the areola.  Benign on final path. 3.  Psoriasis 4.  Patient feels small nodule under right areola.  Unremarkable on my exam.  REFERRING PHYSICIAN:  Duane Lope ([facility]), M.D.  HISTORY OF PRESENT ILLNESS: Jennifer Walker is a 56 y.o. (DOB: 12-03-1955)  white female whose primary care physician is Creola Corn (though she has not seen him in 5 years) and comes to me today for follow up of left breast lumpectomy x 2 and left axillary SLNBx.  I reviewed the pathology with her.  She has done well from the biopsies, though she has some numbness under her axilla. She also showed me a "nodule" under the areola she had recently noticed.  And we talked about genetic testing.  She was told at the genetics clinic that she had a 7% chance of the test being positive.  Mother died of inflammatory breast cancer int he 81's.  She had a right breast biopsy and right axillary node biopsy at age 33.    Past Medical History  Diagnosis Date  . Breast cancer   . Insomnia   . H/O colonoscopy 2003  . Wears glasses   . Fatigue   . Psoriasis   . Hot flashes   . PONV (postoperative nausea and vomiting)      Current Outpatient Prescriptions  Medication Sig Dispense Refill  . calcipotriene-betamethasone (TACLONEX) ointment Apply 1 application topically daily as needed. For  psoriasis      . Cholecalciferol (VITAMIN D) 2000 UNITS CAPS Take 1 capsule by mouth daily.      . clobetasol (TEMOVATE) 0.05 % external solution Apply 1 application topically daily as needed. For psoriasis      . Glucosamine-Chondroit-Vit C-Mn (GLUCOSAMINE 1500 COMPLEX PO) Take 2 tablets by mouth daily.      . Multiple Vitamins-Minerals (MULTIVITAMINS THER. W/MINERALS) TABS Take 1 tablet by mouth daily.      . nitrofurantoin, macrocrystal-monohydrate, (MACROBID) 100 MG capsule Take 100 mg by mouth daily as needed. Takes when pt has sex      . Nutritional Supplements (MELATONIN PO) Take 1-2 tablets by mouth at bedtime as needed. For sleep      . vitamin E 400 UNIT capsule Take 400 Units by mouth daily.      Marland Kitchen zolpidem (AMBIEN) 10 MG tablet Take 10 mg by mouth at bedtime as needed. For sleep          Allergies  Allergen Reactions  . Sulfa Antibiotics Other (See Comments)    Yeast infections and ulcers  . Sulfa Drugs Cross Reactors   . Tape Rash    REVIEW OF SYSTEMS: Skin:  Psoriasis. Gastrointestinal:  Colonoscopy about 10 years ago for rectal bleeding.  Unknown gastroenterologist.  SOCIAL and FAMILY HISTORY: Married.  Her husband is with her. Does not work.  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 She has two children - 13 and 15. She stressed over and over about living to be 56 years old.  PHYSICAL EXAM: BP 120/78  Pulse 64  Temp 98 F (36.7 C) (Temporal)  Ht 5\' 11"  (1.803 m)  Wt 211 lb 3.2 oz (95.8 kg)  BMI 29.46 kg/m2  SpO2 95%  General: WN WF who is alert and generally healthy appearing.   Breast:  Left - Three incisions in left breast/axilla are all healing well.  She has some numbness under right axilla and down left arm.  Right - She feels a small nodule at 9 o'clock under right areola.  I felt a small area, but nothing suspicious.  DATA REVIEWED: Path report to patient.  Ovidio Kin, MD,  [facility] [address] [place]  [address]   [place], [place]    [address] Phone:  [phone] FAX:  [phone]

## 2011-11-22 ENCOUNTER — Encounter: Payer: Self-pay | Admitting: *Deleted

## 2011-11-22 NOTE — Progress Notes (Signed)
Received Oncotype Dx results of 13.  Gave copy to MD.  Took copy to Med Rec to scan. 

## 2011-11-23 ENCOUNTER — Other Ambulatory Visit (HOSPITAL_BASED_OUTPATIENT_CLINIC_OR_DEPARTMENT_OTHER): Payer: 59 | Admitting: Lab

## 2011-11-23 ENCOUNTER — Ambulatory Visit (HOSPITAL_BASED_OUTPATIENT_CLINIC_OR_DEPARTMENT_OTHER): Payer: 59 | Admitting: Oncology

## 2011-11-23 VITALS — BP 118/76 | HR 53 | Temp 98.5°F | Resp 20 | Ht 71.0 in | Wt 210.2 lb

## 2011-11-23 DIAGNOSIS — N952 Postmenopausal atrophic vaginitis: Secondary | ICD-10-CM

## 2011-11-23 DIAGNOSIS — C50019 Malignant neoplasm of nipple and areola, unspecified female breast: Secondary | ICD-10-CM

## 2011-11-23 DIAGNOSIS — C50419 Malignant neoplasm of upper-outer quadrant of unspecified female breast: Secondary | ICD-10-CM

## 2011-11-23 DIAGNOSIS — Z170 Estrogen receptor positive status [ER+]: Secondary | ICD-10-CM

## 2011-11-23 DIAGNOSIS — G4700 Insomnia, unspecified: Secondary | ICD-10-CM

## 2011-11-23 LAB — COMPREHENSIVE METABOLIC PANEL (CC13)
AST: 19 U/L (ref 5–34)
Alkaline Phosphatase: 43 U/L (ref 40–150)
BUN: 16 mg/dL (ref 7.0–26.0)
Calcium: 9.2 mg/dL (ref 8.4–10.4)
Creatinine: 1 mg/dL (ref 0.6–1.1)
Total Bilirubin: 0.4 mg/dL (ref 0.20–1.20)
Total Protein: 6.7 g/dL (ref 6.4–8.3)

## 2011-11-23 LAB — CBC WITH DIFFERENTIAL/PLATELET
BASO%: 0.9 % (ref 0.0–2.0)
Basophils Absolute: 0 10*3/uL (ref 0.0–0.1)
EOS%: 3.6 % (ref 0.0–7.0)
HCT: 37.6 % (ref 34.8–46.6)
HGB: 12.4 g/dL (ref 11.6–15.9)
LYMPH%: 31.8 % (ref 14.0–49.7)
MCH: 30.9 pg (ref 25.1–34.0)
MCHC: 33.1 g/dL (ref 31.5–36.0)
MCV: 93.4 fL (ref 79.5–101.0)
MONO%: 10.5 % (ref 0.0–14.0)
NEUT%: 53.2 % (ref 38.4–76.8)
lymph#: 1.2 10*3/uL (ref 0.9–3.3)

## 2011-11-23 LAB — CANCER ANTIGEN 27.29: CA 27.29: 5 U/mL (ref 0–39)

## 2011-11-23 NOTE — Progress Notes (Signed)
ID: [name]   DOB: 12-03-1955  MR#: [id number]  WUJ#:[id number]  PCP: Jennifer Corn MD GYN: Jennifer Aschoff MD SU: Jennifer Kin MD   HISTORY OF PRESENT ILLNESS: "Jennifer Walker" had routine screening mammography at [facility] 09/22/2011 showing a possible mass in the left breast. Diagnostic left mammography and ultrasonography at the [facility] 10/05/2011 confirmed an irregular mass in the left upper outer quadrant which was not palpable. Ultrasound showed this to be irregular, solid, and to measure 1.2 cm. The left axilla was unremarkable.  Biopsy of the left breast mass on the same day showed ([id number]) an invasive ductal carcinoma, grade 1, estrogen receptor 100% and progesterone receptor 39% positive, with no HER-2 amplification, and an MIB-1 of 1.32. Jennifer Walker proceeded to bilateral breast MRI 10/08/2011. This confirmed a 1.9 cm enhancing mass in the upper outer portion of the left breast, and found a second, 7 mm mass in the more anterior aspect of the same breast. This second mass was identified by ultrasonography 10/12/2011 and biopsy showed ([id number]) an intraductal papilloma.  Accordingly, on 11/01/2011 the patient underwent double lumpectomy (left upper and left inferior breast) with left axillary sentinel lymph node biopsy under Jennifer Walker. The final pathology ([id number]) showed the left inferior lumpectomy to consist of a fibroadenoma. The left upper lumpectomy showed a 1.8 cm invasive ductal carcinoma, grade 1, with negative margins (closest distance 8 mm) and a negative sentinel lymph node. The patient's subsequent history is as detailed below.  INTERVAL HISTORY: Jennifer Walker comes today with her husband Jennifer Walker for further discussion of her prognosis and treatment options. She did well with her surgery, without unusual problems with pain, fever, or bleeding.  REVIEW OF SYSTEMS: She reports no symptoms suggestive of disease spread and she has chronic problems with insomnia, and  issues relating to vaginal atrophy. Hot flashes are present but not severe. She is concerned about weight gain. She has mild urinary stress incontinence. Otherwise a detailed review of systems was noncontributory.  PAST MEDICAL HISTORY: Past Medical History  Diagnosis Date  . Breast cancer   . Insomnia   . H/O colonoscopy 2003  . Wears glasses   . Fatigue   . Psoriasis   . Hot flashes   . PONV (postoperative nausea and vomiting)     PAST SURGICAL HISTORY: Past Surgical History  Procedure Date  . Knee surgery 2005, 2007    bilateral   . Dilation and curettage of uterus   . Tumor removal 1975    right breast  . Lymph gland excision 1978    right axillary  . Mandible surgery 1982  . Breast surgery 10/1976    right breast benign tumor  . Breast lumpectomy 11/01/2011    left breast lumpectomy    FAMILY HISTORY Family History  Problem Relation Age of Onset  . Breast cancer Mother 12    Inflammatory breast cancer  . Cancer Mother     breast  . Heart attack Father   . Heart disease Father   . Cancer Cousin     breast   the patient's father died at the age of 73 from a myocardial infarction, in the setting of a motorcycle accident. The patient's mother died from inflammatory breast cancer at the age of 63. It had been diagnosed at age 27. The patient has one brother and one sister. There is no other breast or ovarian cancer or other cancer in the immediate family.  GYNECOLOGIC HISTORY: Menarche age 16, menopause 2009.  She did not take hormone replacement. First live birth was age 74. She is GX P2.  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 Her husband Jennifer Walker works as a Emergency planning/management officer for [employer]. Their children are Jennifer Walker ("Jennifer Walker"), 15, and Jennifer Walker"), 13. The patient attends a local [employer]   ADVANCED DIRECTIVES: Not in place  HEALTH MAINTENANCE: History  Substance Use  Topics  . Smoking status: Never Smoker   . Smokeless tobacco: Not on file  . Alcohol Use: 4.2 oz/week    7 Cans of beer per week     1 beer per night     Colonoscopy: 2003  PAP:  Bone density: never  Lipid panel:  Allergies  Allergen Reactions  . Sulfa Antibiotics Other (See Comments)    Yeast infections and ulcers  . Sulfa Drugs Cross Reactors   . Tape Rash    Current Outpatient Prescriptions  Medication Sig Dispense Refill  . calcipotriene-betamethasone (TACLONEX) ointment Apply 1 application topically daily as needed. For psoriasis      . Cholecalciferol (VITAMIN D) 2000 UNITS CAPS Take 1 capsule by mouth daily.      . clobetasol (TEMOVATE) 0.05 % external solution Apply 1 application topically daily as needed. For psoriasis      . Glucosamine-Chondroit-Vit C-Mn (GLUCOSAMINE 1500 COMPLEX PO) Take 2 tablets by mouth daily.      . Multiple Vitamins-Minerals (MULTIVITAMINS THER. W/MINERALS) TABS Take 1 tablet by mouth daily.      . nitrofurantoin, macrocrystal-monohydrate, (MACROBID) 100 MG capsule Take 100 mg by mouth daily as needed. Takes when pt has sex      . Nutritional Supplements (MELATONIN PO) Take 1-2 tablets by mouth at bedtime as needed. For sleep      . vitamin E 400 UNIT capsule Take 400 Units by mouth daily.      Marland Kitchen zolpidem (AMBIEN) 10 MG tablet Take 10 mg by mouth at bedtime as needed. For sleep        OBJECTIVE: Middle-aged white woman who appears well Filed Vitals:   11/23/11 1601  BP: 118/76  Pulse: 53  Temp: 98.5 F (36.9 C)  Resp: 20     Body mass index is 29.32 kg/(m^2).    ECOG FS: 0  Sclerae unicteric Oropharynx clear No cervical or supraclavicular adenopathy Lungs no rales or rhonchi Heart regular rate and rhythm Abd benign MSK no focal spinal tenderness, no peripheral edema Neuro: nonfocal Breasts: The right breast is status post remote lumpectomy. There is no evidence of local recurrence, no suspicious skin or nipple change, and the right  axilla is negative. The left breast is status post recent lumpectomy. The incisions are healing very nicely. The cosmetic result is good. The left axilla is clear.  LAB RESULTS: Lab Results  Component Value Date   WBC 3.8* 11/23/2011   NEUTROABS 2.0 11/23/2011   HGB 12.4 11/23/2011   HCT 37.6 11/23/2011   MCV 93.4 11/23/2011   PLT 231 11/23/2011      Chemistry      Component Value Date/Time   NA 139 10/13/2011 0816   K 4.2 10/13/2011 0816   CL 101 10/13/2011 0816   CO2 30 10/13/2011 0816   BUN 20 10/13/2011 0816   CREATININE 0.89 10/13/2011 0816      Component Value Date/Time   CALCIUM 9.6 10/13/2011 0816   ALKPHOS 45 10/13/2011 0816   AST 18 10/13/2011 0816  ALT 12 10/13/2011 0816   BILITOT 0.4 10/13/2011 0816       Lab Results  Component Value Date   LABCA2 11 10/13/2011    No components found with this basename: [id number]    No results found for this basename: INR:1;PROTIME:1 in the last 168 hours  Urinalysis No results found for this basename: colorurine, appearanceur, labspec, phurine, glucoseu, hgbur, bilirubinur, ketonesur, proteinur, urobilinogen, nitrite, leukocytesur    STUDIES: Nm Sentinel Node Inj-no Rpt (breast)  11/01/2011  CLINICAL DATA: left breast cancer   Sulfur colloid was injected intradermally by the nuclear medicine  technologist for breast cancer sentinel node localization.     Mm Breast Surgical Specimen  11/01/2011  *RADIOLOGY REPORT*  Clinical Data:  Papilloma diagnosed by core needle biopsy in the left anterior upper outer quadrant. Carcinoma diagnosed by core needle biopsy in the left posterior upper outer quadrant.  LEFT BREAST NEEDLE LOCALIZATION WITH MAMMOGRAPHIC GUIDANCE AND SPECIMEN RADIOGRAPH X TWO:  Patient presents for needle localization prior to surgical excision. The patient and I discussed the procedure of needle localization including benefits and alternatives. We discussed the high likelihood of a successful procedure. We discussed the risks  of the procedure, including infection, bleeding, tissue injury, and further surgery. Informed written consent was given.  Using mammographic guidance, sterile technique, 2% lidocaine and a 5 cm modified Kopans needle, the site of the papilloma in the left anterior upper outer quadrant was localized using a lateromedial approach.  Using mammographic guidance, sterile technique, 2% lidocaine and a 7-cm modified Kopans needle, the site of the carcinoma in the left posterior upper outer quadrant was localized using a lateromedial approach.  Films were labeled and sent with the patient surgery.  She tolerated the procedure well.  Two specimen radiographs were performed at [facility] operating room and confirm the left anterior upper outer quadrant biopsy site containing a wing clip and the left posterior upper outer quadrant biopsy site containing a ribbon clip to be present in the tissue sample.  The specimen is marked for pathology.  IMPRESSION: Needle localization in two separate locations of the left breast. No apparent complications.  Original Report Authenticated By: Daryl Eastern, M.D.   Mm Breast Surgical Specimen  11/01/2011  *RADIOLOGY REPORT*  Clinical Data:  Papilloma diagnosed by core needle biopsy in the left anterior upper outer quadrant. Carcinoma diagnosed by core needle biopsy in the left posterior upper outer quadrant.  LEFT BREAST NEEDLE LOCALIZATION WITH MAMMOGRAPHIC GUIDANCE AND SPECIMEN RADIOGRAPH X TWO:  Patient presents for needle localization prior to surgical excision. The patient and I discussed the procedure of needle localization including benefits and alternatives. We discussed the high likelihood of a successful procedure. We discussed the risks of the procedure, including infection, bleeding, tissue injury, and further surgery. Informed written consent was given.  Using mammographic guidance, sterile technique, 2% lidocaine and a 5 cm modified Kopans needle, the site of  the papilloma in the left anterior upper outer quadrant was localized using a lateromedial approach.  Using mammographic guidance, sterile technique, 2% lidocaine and a 7-cm modified Kopans needle, the site of the carcinoma in the left posterior upper outer quadrant was localized using a lateromedial approach.  Films were labeled and sent with the patient surgery.  She tolerated the procedure well.  Two specimen radiographs were performed at [facility] operating room and confirm the left anterior upper outer quadrant biopsy site containing a wing clip and the left posterior upper outer quadrant biopsy  site containing a ribbon clip to be present in the tissue sample.  The specimen is marked for pathology.  IMPRESSION: Needle localization in two separate locations of the left breast. No apparent complications.  Original Report Authenticated By: Daryl Eastern, M.D.   Mm Breast Wire Localization Left  11/01/2011  *RADIOLOGY REPORT*  Clinical Data:  Papilloma diagnosed by core needle biopsy in the left anterior upper outer quadrant. Carcinoma diagnosed by core needle biopsy in the left posterior upper outer quadrant.  LEFT BREAST NEEDLE LOCALIZATION WITH MAMMOGRAPHIC GUIDANCE AND SPECIMEN RADIOGRAPH X TWO:  Patient presents for needle localization prior to surgical excision. The patient and I discussed the procedure of needle localization including benefits and alternatives. We discussed the high likelihood of a successful procedure. We discussed the risks of the procedure, including infection, bleeding, tissue injury, and further surgery. Informed written consent was given.  Using mammographic guidance, sterile technique, 2% lidocaine and a 5 cm modified Kopans needle, the site of the papilloma in the left anterior upper outer quadrant was localized using a lateromedial approach.  Using mammographic guidance, sterile technique, 2% lidocaine and a 7-cm modified Kopans needle, the site of the carcinoma in  the left posterior upper outer quadrant was localized using a lateromedial approach.  Films were labeled and sent with the patient surgery.  She tolerated the procedure well.  Two specimen radiographs were performed at [facility] operating room and confirm the left anterior upper outer quadrant biopsy site containing a wing clip and the left posterior upper outer quadrant biopsy site containing a ribbon clip to be present in the tissue sample.  The specimen is marked for pathology.  IMPRESSION: Needle localization in two separate locations of the left breast. No apparent complications.  Original Report Authenticated By: Daryl Eastern, M.D.   Mm Breast Wire Localization Left  11/01/2011  *RADIOLOGY REPORT*  Clinical Data:  Papilloma diagnosed by core needle biopsy in the left anterior upper outer quadrant. Carcinoma diagnosed by core needle biopsy in the left posterior upper outer quadrant.  LEFT BREAST NEEDLE LOCALIZATION WITH MAMMOGRAPHIC GUIDANCE AND SPECIMEN RADIOGRAPH X TWO:  Patient presents for needle localization prior to surgical excision. The patient and I discussed the procedure of needle localization including benefits and alternatives. We discussed the high likelihood of a successful procedure. We discussed the risks of the procedure, including infection, bleeding, tissue injury, and further surgery. Informed written consent was given.  Using mammographic guidance, sterile technique, 2% lidocaine and a 5 cm modified Kopans needle, the site of the papilloma in the left anterior upper outer quadrant was localized using a lateromedial approach.  Using mammographic guidance, sterile technique, 2% lidocaine and a 7-cm modified Kopans needle, the site of the carcinoma in the left posterior upper outer quadrant was localized using a lateromedial approach.  Films were labeled and sent with the patient surgery.  She tolerated the procedure well.  Two specimen radiographs were performed at [facility] operating room and confirm the left anterior upper outer quadrant biopsy site containing a wing clip and the left posterior upper outer quadrant biopsy site containing a ribbon clip to be present in the tissue sample.  The specimen is marked for pathology.  IMPRESSION: Needle localization in two separate locations of the left breast. No apparent complications.  Original Report Authenticated By: Daryl Eastern, M.D.    ASSESSMENT: 56 y.o. status post Left lumpectomy and sentinel lymph node sampling 11/01/2011 for a pT1c pN0, stage IA invasive  ductal carcinoma, grade 1, estrogen receptor 100% and progesterone receptor 39% positive, with no HER-2 amplification, and an MIB-1 of 8%.  (1) the Oncotype DX recurrent score is 13, predicting a distant recurrence rate of 8% in women like her who take tamoxifen for 5 years.  PLAN: We reviewed her situation in detail and I was impressed by Linda's knowledge of her breast cancer, which was technical an accurate. She has a good understanding of her prognosis with anti-estrogens as her only systemic therapy, and understands that adding chemotherapy might reduce her risk of recurrence by 2-3% or not at all. Accordingly chemotherapy is not planned, and her next at is radiation. She is a ready scheduled to meet with Dr. Michell Heinrich to discuss that  When she completes radiation she will see me again. At that visit we will decide whether to start with tamoxifen or an aromatase inhibitor, and to help [place] in that decision we will set her up for a bone density. I gave her written information on tamoxifen and anastrozole. She had other questions regarding weight issues, and genetic testing.  As far as her weight concern goes, we went over data that shows randomized studies of an aromatase inhibitor versus placebo or tamoxifen versus placebo show that all treatments groups gain weight. In other words perimenopausal women tend to gain weight. Her choices of  antiestrogen therefore should not depend on that issue  Her insurance company has refused genetic testing because her risk of having a BRCA mutation does not reach 10%. We discussed a possible appeal of that decision, but after further discussion with our genetics counselor, I think a better strategy will be simply to wait, as the price of this test is plummeting with competition and she may be able to obtain for a few hundred dollars in a few years what she would have to pay several thousand dollars for now.  MAGRINAT,GUSTAV C    11/23/2011

## 2011-11-24 ENCOUNTER — Ambulatory Visit: Payer: 59 | Admitting: Oncology

## 2011-11-25 ENCOUNTER — Encounter: Payer: Self-pay | Admitting: Genetic Counselor

## 2011-11-25 ENCOUNTER — Telehealth: Payer: Self-pay | Admitting: Oncology

## 2011-11-25 ENCOUNTER — Encounter: Payer: Self-pay | Admitting: *Deleted

## 2011-11-25 NOTE — Telephone Encounter (Signed)
Letter sent to pt from Dr. Darnelle Catalan

## 2011-11-25 NOTE — Telephone Encounter (Signed)
lmonvm adviisng the pt of her oct appts with dr Darnelle Catalan

## 2011-11-25 NOTE — Progress Notes (Signed)
Mailed after appt letter to pt. 

## 2011-11-30 ENCOUNTER — Encounter: Payer: Self-pay | Admitting: Radiation Oncology

## 2011-11-30 NOTE — Progress Notes (Signed)
56 year old female. Married to [employer]. Worked as Magazine features editor but, is now a housewife. Two sons , Meredith Staggers and [place].  S/P left lumpectomy and sentinel lymph node sampling 11/01/11 for T1c pN0 stage 1A invasive ductal carcinoma grade 1 ER/PR positive and HER 2 negative. Oncotype DX score of 13. Following completion of chemotherapy will see Dr. Darnelle Catalan to discuss tamoxifen or an aromatase inhibitor. Refused genetic testing.   AX: Sulfa and tape No indication of a pacemaker No hx of radiation therapy

## 2011-12-01 ENCOUNTER — Encounter: Payer: Self-pay | Admitting: Radiation Oncology

## 2011-12-01 ENCOUNTER — Ambulatory Visit
Admission: RE | Admit: 2011-12-01 | Discharge: 2011-12-01 | Disposition: A | Discharge status: Home / Self Care | Payer: 59 | Source: Ambulatory Visit | Attending: Radiation Oncology | Admitting: Radiation Oncology

## 2011-12-01 VITALS — BP 114/77 | HR 59 | Temp 97.9°F | Resp 18 | Ht 71.0 in | Wt 209.6 lb

## 2011-12-01 DIAGNOSIS — C50919 Malignant neoplasm of unspecified site of unspecified female breast: Secondary | ICD-10-CM | POA: Insufficient documentation

## 2011-12-01 DIAGNOSIS — L259 Unspecified contact dermatitis, unspecified cause: Secondary | ICD-10-CM | POA: Insufficient documentation

## 2011-12-01 DIAGNOSIS — C50419 Malignant neoplasm of upper-outer quadrant of unspecified female breast: Secondary | ICD-10-CM

## 2011-12-01 DIAGNOSIS — Z510 Encounter for antineoplastic radiation therapy: Secondary | ICD-10-CM | POA: Insufficient documentation

## 2011-12-01 DIAGNOSIS — Z170 Estrogen receptor positive status [ER+]: Secondary | ICD-10-CM | POA: Insufficient documentation

## 2011-12-01 NOTE — Progress Notes (Signed)
Submitted complete PATIENT MEASURE OF DISTRESS worksheet with a score of 3-4 to social work. Patient left phone number for contact.

## 2011-12-01 NOTE — Progress Notes (Signed)
See progress note under physician encounter. 

## 2011-12-01 NOTE — Progress Notes (Signed)
Department of Radiation Oncology  Phone:  [phone] Fax:        [phone]   Name: Jennifer Walker   DOB: 08/02/1955  MRN: [id number]    Date: 12/01/2011  Follow Up Visit Note  Diagnosis: T1 c. N0 invasive ductal carcinoma of the left breast  Interval History: Jennifer Walker presents today for routine followup.  She has recovered well from her surgery which she had performed on 87. She had a lumpectomy as well as an excision of the other inferior area which showed fibrocystic change and a fibroadenoma. No atypia or malignancy was identified. The left upper lumpectomy showed an invasive grade 1 ductal carcinoma measuring 1.8 cm. There was associated intermediate grade ductal carcinoma in situ with no lymphovascular invasion. Her ultimate margins were negative with the closest being 0.8 cm. Her estrogen receptors were positive at 100% progesterone receptor positive at 39%. HER-2 was negative. She had Oncotype testing performed which was a lower current score of 13. She therefore will not proceed on with chemotherapy. She is ready to proceed on with radiation. She just asked that her appointment times do not coincide with time just to drop her kids off or pick them up from school. She was referred but refused genetic counseling.  Allergies:  Allergies  Allergen Reactions  . Sulfa Antibiotics Other (See Comments)    Yeast infections and ulcers  . Sulfa Drugs Cross Reactors   . Tape Rash    Medications:  Current Outpatient Prescriptions  Medication Sig Dispense Refill  . calcipotriene-betamethasone (TACLONEX) ointment Apply 1 application topically daily as needed. For psoriasis      . Cholecalciferol (VITAMIN D) 2000 UNITS CAPS Take 1 capsule by mouth daily.      . clobetasol (TEMOVATE) 0.05 % external solution Apply 1 application topically daily as needed. For psoriasis      . Glucosamine-Chondroit-Vit C-Mn (GLUCOSAMINE 1500 COMPLEX PO) Take 2 tablets by mouth daily.      .  Multiple Vitamins-Minerals (MULTIVITAMINS THER. W/MINERALS) TABS Take 1 tablet by mouth daily.      . nitrofurantoin, macrocrystal-monohydrate, (MACROBID) 100 MG capsule Take 100 mg by mouth daily as needed. Takes when pt has sex      . Nutritional Supplements (MELATONIN PO) Take 1-2 tablets by mouth at bedtime as needed. For sleep      . vitamin E 400 UNIT capsule Take 400 Units by mouth daily.      Marland Kitchen zolpidem (AMBIEN) 10 MG tablet Take 10 mg by mouth at bedtime as needed. For sleep        Physical Exam:   height is 5\' 11"  (1.803 m) and weight is 209 lb 9.6 oz (95.074 kg). Her oral temperature is 97.9 F (36.6 C). Her blood pressure is 114/77 and her pulse is 59. Her respiration is 18.  She is a pleasant female in no distress sitting him down examined table. Her surgical incisions are well healed.  IMPRESSION: [name] is a 56 y.o. female with a T1 C. N0 invasive ductal carcinoma left breast  PLAN:  Jennifer Walker looks great. She is ready to proceed on with radiation. We discussed the process of simulation the placement tattoos. We discussed the use of breath hold to decrease dose to the heart. We discussed 33 treatments as an outpatient. We discussed the possible risks and benefits of treatment including but not limited to skin redness and fatigue. We discussed the use of Radiaplex cream to decrease the side effects. We discussed the possibility of  secondary malignancies. We discussed that she could continue playing tennis and doing her evening walks as she tolerated it. She has signed informed consent and agree to proceed forward. We are able to fit her in for simulation later this week. We'll meet with back with Dr. Welton Flakes to discuss antiestrogen therapy after radiation.     Lurline Hare, MD

## 2011-12-01 NOTE — Progress Notes (Signed)
Received patient in the clinic today unaccompanied for consultation with Dr. Michell Heinrich to discuss in detail the role of radiation therapy in the treatment of left breast ca. Patient is alert and oriented to person, place, and time. No distress noted. Steady gait noted. Pleasant affect noted. Patient denies pain at this time. Patient denies breast pain at this time. Patient reports that she takes [place] and melatonin daily to aid in sleep. 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 Patient menopausal. Patient reports on average she sleep approximately six hours per night with the aid of medication. Patient has full ROM of all extremities. No lymphedema noted. Denies nipple discharge. Patient denies nausea, vomiting, headache or dizziness. Patient reports that all her incision are healed and well approximated with the exception of the one under her axilla. Patient reports she noted oozing from this incision but, took antibiotics she had at home. She thinks following a few days of antibiotics the incision is now healed and closed. Reported all findings to Dr. Michell Heinrich.

## 2011-12-02 ENCOUNTER — Ambulatory Visit
Admission: RE | Admit: 2011-12-02 | Discharge: 2011-12-02 | Disposition: A | Discharge status: Home / Self Care | Payer: 59 | Source: Ambulatory Visit | Attending: Radiation Oncology | Admitting: Radiation Oncology

## 2011-12-02 DIAGNOSIS — C50419 Malignant neoplasm of upper-outer quadrant of unspecified female breast: Secondary | ICD-10-CM

## 2011-12-02 NOTE — Progress Notes (Signed)
Name: Jennifer Walker   MRN: [id number]  Date:  12/02/2011  DOB: 1955/11/05  Status:outpatient    DIAGNOSIS: Breast cancer.  CONSENT VERIFIED: yes   SET UP: Patient is setup supine   IMMOBILIZATION:  The following immobilization was used:Custom Moldable Pillow, breast board.   NARRATIVE: [name] was brought to the CT Simulation planning suite.  Identity was confirmed.  All relevant records and images related to the planned course of therapy were reviewed.  Then, the patient was positioned in a stable reproducible clinical set-up for radiation therapy.  Wires were placed to delineate the clinical extent of breast tissue. A wire was placed on the upper outer quadrant scar.  CT images were obtained.  An isocenter was placed. Skin markings were placed.  The CT images were loaded into the planning software where the target and avoidance structures were contoured.  The radiation prescription was entered and confirmed. The patient was discharged in stable condition and tolerated simulation well.    TREATMENT PLANNING NOTE:  Treatment planning then occurred. I have requested : MLC's, isodose plan, basic dose calculation  3D simulation was performed.  I have requested and analyzed a dose volume histogram of the heart, lungs and tumor cavity.   3 complex treatment devices were formed.

## 2011-12-02 NOTE — Progress Notes (Signed)
Met with patient to discuss RO billing. Pt had concerns about bra assistance. Will send referral to SW.  Dx: 174.4 Upper-outer quadrant of breast  Attending Rad: Dr. Iona Hansen Tx: [id number] Extrl Beam

## 2011-12-09 ENCOUNTER — Encounter: Payer: Self-pay | Admitting: Radiation Oncology

## 2011-12-09 ENCOUNTER — Ambulatory Visit
Admission: RE | Admit: 2011-12-09 | Discharge: 2011-12-09 | Disposition: A | Discharge status: Home / Self Care | Payer: 59 | Source: Ambulatory Visit | Attending: Radiation Oncology | Admitting: Radiation Oncology

## 2011-12-09 DIAGNOSIS — C50419 Malignant neoplasm of upper-outer quadrant of unspecified female breast: Secondary | ICD-10-CM

## 2011-12-13 ENCOUNTER — Ambulatory Visit
Admission: RE | Admit: 2011-12-13 | Discharge: 2011-12-13 | Disposition: A | Discharge status: Home / Self Care | Payer: 59 | Source: Ambulatory Visit | Attending: Radiation Oncology | Admitting: Radiation Oncology

## 2011-12-14 ENCOUNTER — Encounter: Payer: Self-pay | Admitting: Radiation Oncology

## 2011-12-14 ENCOUNTER — Ambulatory Visit
Admission: RE | Admit: 2011-12-14 | Discharge: 2011-12-14 | Disposition: A | Discharge status: Home / Self Care | Payer: 59 | Source: Ambulatory Visit | Attending: Radiation Oncology | Admitting: Radiation Oncology

## 2011-12-14 VITALS — BP 124/82 | HR 55 | Resp 18 | Wt 206.0 lb

## 2011-12-14 DIAGNOSIS — C50419 Malignant neoplasm of upper-outer quadrant of unspecified female breast: Secondary | ICD-10-CM

## 2011-12-14 NOTE — Progress Notes (Signed)
Weekly Management Note Current Dose:  3.6 Gy  Projected Dose: 61 Gy   Narrative:  The patient presents for routine under treatment assessment.  CBCT/MVCT images/Port film x-rays were reviewed.  The chart was checked. Doing well. No complaints. Wondering about reconstruction/tissue defects. Would like to talk to our dietcian about some lifestyle changes she is about to make. Worried about 2 ropy areas under her breast.   Physical Findings: Weight: 206 lb (93.441 kg). Unchanged. Areas of concern are cooper's ligaments. No skin changes  Impression:  The patient is tolerating radiation.  Plan:  Continue treatment as planned. Dietician tomorrow.

## 2011-12-14 NOTE — Progress Notes (Signed)
Received patient in the clinic today for PUT with Dr. Michell Heinrich and post sim education with Sam, RN. Patient is alert and oriented to person, place, and time. No distress noted. Steady gait noted. Pleasant affect noted. Patient denies pain at this time. Patient denies breast pain at this time. Patient denies skin changes of treated breast. Also, patient denies nipple discharge. Patient has no complaints. Reported all findings to Dr. Michell Heinrich.

## 2011-12-14 NOTE — Progress Notes (Signed)
  Radiation Oncology         [phone] ________________________________  Name: Jennifer Walker MRN: [id number]  Date: 12/09/2011  DOB: [birth year]  Simulation Verification Note  Status: outpatient  NARRATIVE: The patient was brought to the treatment unit and placed in the planned treatment position. The clinical setup was verified. Then port films were obtained and uploaded to the radiation oncology medical record software.  The treatment beams were carefully compared against the planned radiation fields. The position location and shape of the radiation fields was reviewed. They targeted volume of tissue appears to be appropriately covered by the radiation beams. Organs at risk appear to be excluded as planned.  Based on my personal review, I approved the simulation verification. The patient's treatment will proceed as planned.  -----------------------------------  Billie Lade, PhD, MD

## 2011-12-15 ENCOUNTER — Ambulatory Visit
Admission: RE | Admit: 2011-12-15 | Discharge: 2011-12-15 | Disposition: A | Discharge status: Home / Self Care | Payer: 59 | Source: Ambulatory Visit | Attending: Radiation Oncology | Admitting: Radiation Oncology

## 2011-12-16 ENCOUNTER — Ambulatory Visit
Admission: RE | Admit: 2011-12-16 | Discharge: 2011-12-16 | Disposition: A | Discharge status: Home / Self Care | Payer: 59 | Source: Ambulatory Visit | Attending: Radiation Oncology | Admitting: Radiation Oncology

## 2011-12-16 MED ORDER — RADIAPLEXRX EX GEL
Freq: Once | CUTANEOUS | Status: AC
  Administered 2011-12-16: 08:00:00 via TOPICAL

## 2011-12-16 MED ORDER — ALRA NON-METALLIC DEODORANT (RAD-ONC)
1.0000 "application " | Freq: Once | TOPICAL | Status: AC
  Administered 2011-12-16: 1 via TOPICAL

## 2011-12-16 NOTE — Addendum Note (Signed)
Encounter addended by: Agnes Lawrence, RN on: 12/16/2011  8:15 AM<BR>     Documentation filed: Inpatient MAR, Orders

## 2011-12-16 NOTE — Addendum Note (Signed)
Encounter addended by: Agnes Lawrence, RN on: 12/16/2011  8:14 AM<BR>     Documentation filed: Notes Section, Inpatient Patient Education, Inpatient Document Flowsheet

## 2011-12-16 NOTE — Progress Notes (Signed)
Late entry from 12/14/2011 at 1111. Oriented patient to room, staff and equipment. Provided patient with RADIATION THERAPY AND YOU handbook then, pertinent information. Educated patient on potential side effects and management such as fatigue and skin changes. Provided patient with Radiaplex and Alra then, educated on use. Provided patient with this writer's business card and encouraged to call with needs. All questions answered. Patient verbalized understanding of all reviewed.

## 2011-12-17 ENCOUNTER — Ambulatory Visit: Payer: 59 | Admitting: Nutrition

## 2011-12-17 ENCOUNTER — Ambulatory Visit
Admission: RE | Admit: 2011-12-17 | Discharge: 2011-12-17 | Disposition: A | Discharge status: Home / Self Care | Payer: 59 | Source: Ambulatory Visit | Attending: Radiation Oncology | Admitting: Radiation Oncology

## 2011-12-17 NOTE — Assessment & Plan Note (Signed)
Ms. Meikle is a 56 year old female patient of Drs. Michell Heinrich and Magrinat diagnosed with ER-positive, PR-positive breast cancer.  MEDICAL HISTORY INCLUDES:  Fatigue and postoperative nausea and vomiting.  MEDICATIONS INCLUDE:  Vitamin D, glucosamine/chondroitin, vitamin C, multivitamin, melatonin, vitamin E, and Ambien.  LABS:  Glucose of 102 on 08/27.  HEIGHT:  5 feet 11 inches. WEIGHT:  206 pounds. USUAL BODY WEIGHT:  220 pounds. BMI:  28.74.  Patient reports that she is trying to make lifestyle changes.  She has lost approximately 20 pounds secondary to increased exercise and diet changes.  She is primarily wanting information about the safety of soy during treatment.  NUTRITION DIAGNOSIS:  Food and nutrition-related knowledge deficit related to diagnosis of breast cancer and associated treatments as evidenced by no prior need for nutrition-related information.  INTERVENTION:  I educated the patient on the importance of a plant-based diet with lean protein sources in small amounts throughout the day.  I have discouraged unsafe weight loss but have encouraged the patient to continue to exercise and eat a healthy diet for a slow, safe weight loss.  I have educated her on the safety of soy foods during breast cancer treatment.  I have provided her with fact sheets and research articles that she can refer to.  I have encouraged her to continue to exercise to help with her insomnia and some of her menopausal symptoms of which she complains.  MONITORING/EVALUATION (GOALS):  The patient will tolerate a healthy plant-based diet to promote healthy weight.  NEXT VISIT:  There is no followup scheduled.   ______________________________ Zenovia Jarred, RD, CSO, LDN Clinical Nutrition Specialist BN/MEDQ  D:  12/17/2011  T:  12/17/2011  Job:  [id number]

## 2011-12-20 ENCOUNTER — Ambulatory Visit
Admission: RE | Admit: 2011-12-20 | Discharge: 2011-12-20 | Disposition: A | Discharge status: Home / Self Care | Payer: 59 | Source: Ambulatory Visit | Attending: Radiation Oncology | Admitting: Radiation Oncology

## 2011-12-21 ENCOUNTER — Ambulatory Visit
Admission: RE | Admit: 2011-12-21 | Discharge: 2011-12-21 | Disposition: A | Discharge status: Home / Self Care | Payer: 59 | Source: Ambulatory Visit | Attending: Radiation Oncology | Admitting: Radiation Oncology

## 2011-12-21 ENCOUNTER — Encounter: Payer: Self-pay | Admitting: Radiation Oncology

## 2011-12-21 VITALS — BP 121/67 | HR 65 | Resp 18 | Wt 200.5 lb

## 2011-12-21 DIAGNOSIS — C50419 Malignant neoplasm of upper-outer quadrant of unspecified female breast: Secondary | ICD-10-CM

## 2011-12-21 NOTE — Progress Notes (Signed)
Weekly Management Note Current Dose: 12.6  Gy  Projected Dose: 60.4 Gy   Narrative:  The patient presents for routine under treatment assessment.  CBCT/MVCT images/Port film x-rays were reviewed.  The chart was checked. Doing well. Excited about a 20 pound weight loss. Still using radiaplex. Sore in left axilla.   Physical Findings: Weight: 200 lb 8 oz (90.946 kg). Unchanged. No swelling in left axilla. Breast is unremarkable.  Impression:  The patient is tolerating radiation.  Plan:  Continue treatment as planned. Encouraged and congratulated her on weight loss. Continue radiaplex.

## 2011-12-21 NOTE — Progress Notes (Signed)
Patient presents to the clinic today for PUT with Dr. Michell Heinrich. Patient is alert and oriented to person, place, and time. No distress noted. Steady gait noted. Pleasant affect noted. Patient denies pain at this time. However, patient reports that her left axilla remains sore and she questions if her left breast is swollen. Patient reports using radiaplex as directed on her left/treated breast. No skin changes noted to left/treated breast. Patient reports that she has decreased her sodium intake (her favorite peanuts) and as a result has seen a decline in her bp and weight. Reported all findings to Dr. Michell Heinrich.

## 2011-12-22 ENCOUNTER — Ambulatory Visit
Admission: RE | Admit: 2011-12-22 | Discharge: 2011-12-22 | Disposition: A | Discharge status: Home / Self Care | Payer: 59 | Source: Ambulatory Visit | Attending: Radiation Oncology | Admitting: Radiation Oncology

## 2011-12-23 ENCOUNTER — Ambulatory Visit
Admission: RE | Admit: 2011-12-23 | Discharge: 2011-12-23 | Disposition: A | Discharge status: Home / Self Care | Payer: 59 | Source: Ambulatory Visit | Attending: Radiation Oncology | Admitting: Radiation Oncology

## 2011-12-24 ENCOUNTER — Ambulatory Visit
Admission: RE | Admit: 2011-12-24 | Discharge: 2011-12-24 | Disposition: A | Discharge status: Home / Self Care | Payer: 59 | Source: Ambulatory Visit | Attending: Radiation Oncology | Admitting: Radiation Oncology

## 2011-12-27 ENCOUNTER — Ambulatory Visit
Admission: RE | Admit: 2011-12-27 | Discharge: 2011-12-27 | Disposition: A | Discharge status: Home / Self Care | Payer: 59 | Source: Ambulatory Visit | Attending: Radiation Oncology | Admitting: Radiation Oncology

## 2011-12-27 NOTE — Addendum Note (Signed)
Encounter addended by: Delynn Flavin, RN on: 12/27/2011  8:19 PM<BR>     Documentation filed: Charges VN

## 2011-12-28 ENCOUNTER — Encounter: Payer: Self-pay | Admitting: Radiation Oncology

## 2011-12-28 ENCOUNTER — Ambulatory Visit
Admission: RE | Admit: 2011-12-28 | Discharge: 2011-12-28 | Disposition: A | Discharge status: Home / Self Care | Payer: 59 | Source: Ambulatory Visit | Attending: Radiation Oncology | Admitting: Radiation Oncology

## 2011-12-28 VITALS — Resp 18 | Wt 198.4 lb

## 2011-12-28 DIAGNOSIS — C50419 Malignant neoplasm of upper-outer quadrant of unspecified female breast: Secondary | ICD-10-CM

## 2011-12-28 NOTE — Progress Notes (Signed)
Received patient in the clinic today for PUT with Dr. Michell Heinrich. Patient is alert and oriented to person, place, and time. No distress noted. Steady gait noted. Pleasant affect noted. Patient denies pain at this time.Patient reports that she played tennis with a low cut shirt and she feels like some of the hyperpigmentation in the center of the chest wall is related to that sun exposure. Reinforced proper skin care. Patient reports that she continues to use Radiaplex as directed. No desquamation of the treated area noted. Patient reports that she continues decrease sodium and stay active. Weight loss of 2 pounds since last week noted. Patient last week weighed 200 and today weighs 198. Reported all findings to Dr. Michell Heinrich.

## 2011-12-28 NOTE — Progress Notes (Signed)
Weekly Management Note Current Dose:  21.6 Gy  Projected Dose: 61 Gy   Narrative:  The patient presents for routine under treatment assessment.  CBCT/MVCT images/Port film x-rays were reviewed.  The chart was checked. Lost 2 pounds. Some dermatitis medially. No soreness. Some swelling.   Physical Findings: Weight: 198 lb 6.4 oz (89.994 kg). Unchanged. Few spots of dermatitis medially.   Impression:  The patient is tolerating radiation.  Plan:  Continue treatment as planned. Continue radiaplex.

## 2011-12-29 ENCOUNTER — Ambulatory Visit
Admission: RE | Admit: 2011-12-29 | Discharge: 2011-12-29 | Disposition: A | Discharge status: Home / Self Care | Payer: 59 | Source: Ambulatory Visit | Attending: Radiation Oncology | Admitting: Radiation Oncology

## 2011-12-29 ENCOUNTER — Ambulatory Visit
Admission: RE | Admit: 2011-12-29 | Discharge: 2011-12-29 | Disposition: A | Discharge status: Home / Self Care | Payer: 59 | Source: Ambulatory Visit | Attending: Oncology | Admitting: Oncology

## 2011-12-29 DIAGNOSIS — C50419 Malignant neoplasm of upper-outer quadrant of unspecified female breast: Secondary | ICD-10-CM

## 2011-12-30 ENCOUNTER — Ambulatory Visit
Admission: RE | Admit: 2011-12-30 | Discharge: 2011-12-30 | Disposition: A | Discharge status: Home / Self Care | Payer: 59 | Source: Ambulatory Visit | Attending: Radiation Oncology | Admitting: Radiation Oncology

## 2011-12-31 ENCOUNTER — Encounter: Payer: Self-pay | Admitting: Radiation Oncology

## 2011-12-31 ENCOUNTER — Ambulatory Visit
Admission: RE | Admit: 2011-12-31 | Discharge: 2011-12-31 | Disposition: A | Discharge status: Home / Self Care | Payer: 59 | Source: Ambulatory Visit | Attending: Radiation Oncology | Admitting: Radiation Oncology

## 2011-12-31 NOTE — Progress Notes (Signed)
Name: Jennifer Walker   MRN: [id number]  Date:  12/31/2011   DOB: 01-Mar-1956  Status:outpatient    DIAGNOSIS: Breast cancer.  CONSENT VERIFIED: yes   SET UP: Patient is setup supine   IMMOBILIZATION:  The following immobilization was used:Custom Moldable Pillow, breast board.   NARRATIVE: [name] underwent complex simulation and treatment planning for her boost treatment today.  Her tumor volume was outlined on the planning CT scan. The depth of her cavity was 3.6 cm.    12  MeV electrons will be prescribed to the 90% Isodose line.   A block will be used for beam modification purposes.  A special port plan is requested.

## 2012-01-03 ENCOUNTER — Ambulatory Visit
Admission: RE | Admit: 2012-01-03 | Discharge: 2012-01-03 | Disposition: A | Discharge status: Home / Self Care | Payer: 59 | Source: Ambulatory Visit | Attending: Radiation Oncology | Admitting: Radiation Oncology

## 2012-01-04 ENCOUNTER — Ambulatory Visit
Admission: RE | Admit: 2012-01-04 | Discharge: 2012-01-04 | Disposition: A | Discharge status: Home / Self Care | Payer: 59 | Source: Ambulatory Visit | Attending: Radiation Oncology | Admitting: Radiation Oncology

## 2012-01-04 ENCOUNTER — Encounter: Payer: Self-pay | Admitting: Radiation Oncology

## 2012-01-04 VITALS — BP 120/77 | HR 53 | Resp 18 | Wt 202.7 lb

## 2012-01-04 DIAGNOSIS — C50419 Malignant neoplasm of upper-outer quadrant of unspecified female breast: Secondary | ICD-10-CM

## 2012-01-04 NOTE — Progress Notes (Signed)
Weekly Management Note Current Dose: 30.6  Gy  Projected Dose: 61 Gy   Narrative:  The patient presents for routine under treatment assessment.  CBCT/MVCT images/Port film x-rays were reviewed.  The chart was checked. Irritation in medialy chest wall. Soreness and swelling in posterior/lateral chest wall. Playing tennis and feeling otherwise well.    Physical Findings: Weight: 202 lb 11.2 oz (91.944 kg).  Dermatitis medially  Impression:  The patient is tolerating radiation.  Plan:  Continue treatment as planned. Hydrocortisone to medial aspect of breast. If that doesn't help, may need to switch to biafene.

## 2012-01-04 NOTE — Progress Notes (Signed)
Received patient in the clinic today for PUT with Dr. Michell Heinrich. Patient is alert and oriented to person, place, and time. No distress noted. Steady gait noted. Pleasant affect noted. Patient denies pain at this time. Hyperpigmentation without desquamation of left breast noted. Radiation dermatitis of left upper chest noted. Encouraged patient to apply hydrocortisone to area of dermatitis to relieve itching and she verbalized understanding. Patient reports that she continues to use Radiaplex as directed. Weight has risen slightly. Patient reports she felt sluggish Friday night but, is "coming back and even played tennis Sunday." Reported all findings to Dr. Michell Heinrich.

## 2012-01-05 ENCOUNTER — Ambulatory Visit
Admission: RE | Admit: 2012-01-05 | Discharge: 2012-01-05 | Disposition: A | Discharge status: Home / Self Care | Payer: 59 | Source: Ambulatory Visit | Attending: Radiation Oncology | Admitting: Radiation Oncology

## 2012-01-06 ENCOUNTER — Ambulatory Visit: Payer: 59

## 2012-01-07 ENCOUNTER — Ambulatory Visit
Admission: RE | Admit: 2012-01-07 | Discharge: 2012-01-07 | Disposition: A | Discharge status: Home / Self Care | Payer: 59 | Source: Ambulatory Visit | Attending: Radiation Oncology | Admitting: Radiation Oncology

## 2012-01-10 ENCOUNTER — Ambulatory Visit
Admission: RE | Admit: 2012-01-10 | Discharge: 2012-01-10 | Disposition: A | Discharge status: Home / Self Care | Payer: 59 | Source: Ambulatory Visit | Attending: Radiation Oncology | Admitting: Radiation Oncology

## 2012-01-11 ENCOUNTER — Ambulatory Visit
Admission: RE | Admit: 2012-01-11 | Discharge: 2012-01-11 | Disposition: A | Discharge status: Home / Self Care | Payer: 59 | Source: Ambulatory Visit | Attending: Radiation Oncology | Admitting: Radiation Oncology

## 2012-01-11 DIAGNOSIS — C50419 Malignant neoplasm of upper-outer quadrant of unspecified female breast: Secondary | ICD-10-CM

## 2012-01-11 NOTE — Progress Notes (Signed)
Weekly Management Note Current Dose: 37.8  Gy  Projected Dose: 61 Gy   Narrative:  The patient presents for routine under treatment assessment.  CBCT/MVCT images/Port film x-rays were reviewed.  The chart was checked. Doing well. Steroid cream controlling itching. Continues with radiaplex.   Physical Findings: Dermatitis medially. Some pink in rest of breast.  Impression:  The patient is tolerating radiation.  Plan:  Continue treatment as planned. Continue hydrocortisone and radiaplex.

## 2012-01-12 ENCOUNTER — Ambulatory Visit
Admission: RE | Admit: 2012-01-12 | Discharge: 2012-01-12 | Disposition: A | Discharge status: Home / Self Care | Payer: 59 | Source: Ambulatory Visit | Attending: Radiation Oncology | Admitting: Radiation Oncology

## 2012-01-13 ENCOUNTER — Ambulatory Visit
Admission: RE | Admit: 2012-01-13 | Discharge: 2012-01-13 | Disposition: A | Discharge status: Home / Self Care | Payer: 59 | Source: Ambulatory Visit | Attending: Radiation Oncology | Admitting: Radiation Oncology

## 2012-01-14 ENCOUNTER — Ambulatory Visit
Admission: RE | Admit: 2012-01-14 | Discharge: 2012-01-14 | Disposition: A | Discharge status: Home / Self Care | Payer: 59 | Source: Ambulatory Visit | Attending: Radiation Oncology | Admitting: Radiation Oncology

## 2012-01-17 ENCOUNTER — Ambulatory Visit
Admission: RE | Admit: 2012-01-17 | Discharge: 2012-01-17 | Disposition: A | Discharge status: Home / Self Care | Payer: 59 | Source: Ambulatory Visit | Attending: Radiation Oncology | Admitting: Radiation Oncology

## 2012-01-17 ENCOUNTER — Ambulatory Visit: Payer: 59

## 2012-01-18 ENCOUNTER — Ambulatory Visit: Payer: 59

## 2012-01-18 ENCOUNTER — Ambulatory Visit
Admission: RE | Admit: 2012-01-18 | Discharge: 2012-01-18 | Disposition: A | Discharge status: Home / Self Care | Payer: 59 | Source: Ambulatory Visit | Attending: Radiation Oncology | Admitting: Radiation Oncology

## 2012-01-18 ENCOUNTER — Ambulatory Visit
Admission: RE | Admit: 2012-01-18 | Discharge: 2012-01-18 | Disposition: A | Discharge status: Home / Self Care | Payer: 59 | Source: Ambulatory Visit | Admitting: Radiation Oncology

## 2012-01-18 DIAGNOSIS — C50419 Malignant neoplasm of upper-outer quadrant of unspecified female breast: Secondary | ICD-10-CM

## 2012-01-18 NOTE — Progress Notes (Signed)
Weekly Management Note Current Dose: 45  Gy  Projected Dose: 60.4 Gy   Narrative:  The patient presents for routine under treatment assessment.  CBCT/MVCT images/Port film x-rays were reviewed.  The chart was checked. Checked set up of electron boost on machine. Verified correct incision with patient. Still losing weight. No skin complaints. Using hydrocortisone and radiaplex.   Physical Findings: Slight pink around breast. > inframammary folds and axilla, medially.   Impression:  The patient is tolerating radiation.  Plan:  Continue treatment as planned. Continue supportive meds.

## 2012-01-19 ENCOUNTER — Ambulatory Visit: Payer: 59

## 2012-01-19 ENCOUNTER — Ambulatory Visit
Admission: RE | Admit: 2012-01-19 | Discharge: 2012-01-19 | Disposition: A | Discharge status: Home / Self Care | Payer: 59 | Source: Ambulatory Visit | Attending: Radiation Oncology | Admitting: Radiation Oncology

## 2012-01-20 ENCOUNTER — Ambulatory Visit: Payer: 59

## 2012-01-20 ENCOUNTER — Ambulatory Visit
Admission: RE | Admit: 2012-01-20 | Discharge: 2012-01-20 | Disposition: A | Discharge status: Home / Self Care | Payer: 59 | Source: Ambulatory Visit | Attending: Radiation Oncology | Admitting: Radiation Oncology

## 2012-01-21 ENCOUNTER — Ambulatory Visit: Payer: 59

## 2012-01-21 ENCOUNTER — Ambulatory Visit
Admission: RE | Admit: 2012-01-21 | Discharge: 2012-01-21 | Disposition: A | Discharge status: Home / Self Care | Payer: 59 | Source: Ambulatory Visit | Attending: Radiation Oncology | Admitting: Radiation Oncology

## 2012-01-24 ENCOUNTER — Ambulatory Visit: Payer: 59

## 2012-01-24 ENCOUNTER — Ambulatory Visit
Admission: RE | Admit: 2012-01-24 | Discharge: 2012-01-24 | Disposition: A | Discharge status: Home / Self Care | Payer: 59 | Source: Ambulatory Visit | Attending: Radiation Oncology | Admitting: Radiation Oncology

## 2012-01-25 ENCOUNTER — Ambulatory Visit: Payer: 59

## 2012-01-25 ENCOUNTER — Ambulatory Visit
Admission: RE | Admit: 2012-01-25 | Discharge: 2012-01-25 | Disposition: A | Discharge status: Home / Self Care | Payer: 59 | Source: Ambulatory Visit | Attending: Radiation Oncology | Admitting: Radiation Oncology

## 2012-01-25 ENCOUNTER — Encounter: Payer: Self-pay | Admitting: Radiation Oncology

## 2012-01-25 VITALS — BP 112/70 | HR 56 | Temp 97.8°F | Resp 20 | Wt 198.8 lb

## 2012-01-25 DIAGNOSIS — C50419 Malignant neoplasm of upper-outer quadrant of unspecified female breast: Secondary | ICD-10-CM

## 2012-01-25 NOTE — Progress Notes (Signed)
Pt has mild tenderness of left breast/axilla, fatigue at end of week, denies loss of appetite. Applying Radiaplex to left breast tx area.

## 2012-01-25 NOTE — Progress Notes (Signed)
Weekly Management Note Current Dose: 57 Gy  Projected Dose: 61 Gy   Narrative:  The patient presents for routine under treatment assessment.  CBCT/MVCT images/Port film x-rays were reviewed.  The chart was checked. Doing well. Lost a total of 20 pounds during treatment. No kin issues. Using radiaplex.  Physical Findings: Weight: 198 lb 12.8 oz (90.175 kg). Pink around boost.   Impression:  The patient is tolerating radiation.  Plan:  Continue treatment as planned. Radiaplex x 2 weeks then lotion with vit e. Discussed [facility]

## 2012-01-26 ENCOUNTER — Other Ambulatory Visit (HOSPITAL_BASED_OUTPATIENT_CLINIC_OR_DEPARTMENT_OTHER): Payer: 59 | Admitting: Lab

## 2012-01-26 ENCOUNTER — Ambulatory Visit
Admission: RE | Admit: 2012-01-26 | Discharge: 2012-01-26 | Disposition: A | Discharge status: Home / Self Care | Payer: 59 | Source: Ambulatory Visit | Attending: Radiation Oncology | Admitting: Radiation Oncology

## 2012-01-26 ENCOUNTER — Ambulatory Visit (HOSPITAL_BASED_OUTPATIENT_CLINIC_OR_DEPARTMENT_OTHER): Payer: 59 | Admitting: Oncology

## 2012-01-26 ENCOUNTER — Ambulatory Visit: Payer: 59

## 2012-01-26 ENCOUNTER — Telehealth: Payer: Self-pay | Admitting: *Deleted

## 2012-01-26 VITALS — BP 118/76 | HR 61 | Temp 97.7°F | Resp 20 | Ht 71.0 in | Wt 197.4 lb

## 2012-01-26 DIAGNOSIS — C50419 Malignant neoplasm of upper-outer quadrant of unspecified female breast: Secondary | ICD-10-CM

## 2012-01-26 DIAGNOSIS — Z170 Estrogen receptor positive status [ER+]: Secondary | ICD-10-CM

## 2012-01-26 LAB — CBC & DIFF AND RETIC
Basophils Absolute: 0 10*3/uL (ref 0.0–0.1)
EOS%: 2.2 % (ref 0.0–7.0)
Eosinophils Absolute: 0.1 10*3/uL (ref 0.0–0.5)
HGB: 12.7 g/dL (ref 11.6–15.9)
LYMPH%: 18.4 % (ref 14.0–49.7)
MCH: 30.7 pg (ref 25.1–34.0)
MCV: 91.8 fL (ref 79.5–101.0)
MONO%: 9.6 % (ref 0.0–14.0)
NEUT#: 2.8 10*3/uL (ref 1.5–6.5)
NEUT%: 69.3 % (ref 38.4–76.8)
Platelets: 227 10*3/uL (ref 145–400)
RDW: 11.9 % (ref 11.2–14.5)

## 2012-01-26 MED ORDER — ANASTROZOLE 1 MG PO TABS: 1.0000 mg | ORAL_TABLET | Freq: Every day | ORAL | Status: DC

## 2012-01-26 NOTE — Telephone Encounter (Signed)
Gave patient appointment for 04-2012 with Jennifer Walker starting at 10:15am

## 2012-01-26 NOTE — Progress Notes (Signed)
ID: Jennifer Walker   DOB: [birth year]  MR#: [id number]  WUJ#:[id number]  PCP: Creola Corn MD GYN: Miguel Aschoff MD SU: Ovidio Kin MD   HISTORY OF PRESENT ILLNESS: "Jennifer Walker" had routine screening mammography at [facility] 09/22/2011 showing a possible mass in the left breast. Diagnostic left mammography and ultrasonography at the [facility] 10/05/2011 confirmed an irregular mass in the left upper outer quadrant which was not palpable. Ultrasound showed this to be irregular, solid, and to measure 1.2 cm. The left axilla was unremarkable.  Biopsy of the left breast mass on the same day showed ([id number]) an invasive ductal carcinoma, grade 1, estrogen receptor 100% and progesterone receptor 39% positive, with no HER-2 amplification, and an MIB-1 of 1.32. Jennifer Walker proceeded to bilateral breast MRI 10/08/2011. This confirmed a 1.9 cm enhancing mass in the upper outer portion of the left breast, and found a second, 7 mm mass in the more anterior aspect of the same breast. This second mass was identified by ultrasonography 10/12/2011 and biopsy showed ([id number]) an intraductal papilloma.  Accordingly, on 11/01/2011 the patient underwent double lumpectomy (left upper and left inferior breast) with left axillary sentinel lymph node biopsy under Ovidio Kin. The final pathology ([id number]) showed the left inferior lumpectomy to consist of a fibroadenoma. The left upper lumpectomy showed a 1.8 cm invasive ductal carcinoma, grade 1, with negative margins (closest distance 8 mm) and a negative sentinel lymph node. The patient's subsequent history is as detailed below.  INTERVAL HISTORY: Jennifer Walker comes today for followup of her breast cancer. She will complete her radiation treatments tomorrow.  REVIEW OF SYSTEMS: She has done remarkably well with radiation, with no significant desquamation. She has had a couple of episodes of "bottoming out", particularly on Fridays. She then recovers over the  weekend. She has been able to maintain her normal functional status. Hot flashes are very minor for her. She does have vaginal dryness already and she and her husband are "dealing with it.". A detailed review of systems today was otherwise noncontributory  PAST MEDICAL HISTORY: Past Medical History  Diagnosis Date  . Insomnia   . H/O colonoscopy 2003  . Wears glasses   . Fatigue   . Psoriasis   . Hot flashes   . PONV (postoperative nausea and vomiting)   . Breast cancer     Invasive ductal ca grade 1 er/pr positive, her2  negative    PAST SURGICAL HISTORY: Past Surgical History  Procedure Date  . Knee surgery 2005, 2007    bilateral   . Dilation and curettage of uterus   . Tumor removal 1975    right breast  . Lymph gland excision 1978    right axillary  . Mandible surgery 1982  . Breast surgery 10/1976    right breast benign tumor  . Breast lumpectomy 11/01/2011    left breast lumpectomy    FAMILY HISTORY Family History  Problem Relation Age of Onset  . Breast cancer Mother 42    Inflammatory breast cancer  . Cancer Mother     breast  . Heart attack Father   . Heart disease Father   . Cancer Cousin     breast   the patient's father died at the age of 44 from a myocardial infarction, in the setting of a motorcycle accident. The patient's mother died from inflammatory breast cancer at the age of 28. It had been diagnosed at age 22. The patient has one brother and one sister. There  is no other breast or ovarian cancer or other cancer in the immediate family.  GYNECOLOGIC HISTORY: Menarche age 56, menopause 2009. She did not take hormone replacement. First live birth was age 10. She is GX P2.  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 Her husband Molly Maduro works as a Emergency planning/management officer for [employer]. Their children are Dannielle Karvonen Junior ("Wes"), 15, and Pilar Grammes Bartley"), 13. The patient attends a local  [employer]   ADVANCED DIRECTIVES: Not in place  HEALTH MAINTENANCE: History  Substance Use Topics  . Smoking status: Never Smoker   . Smokeless tobacco: Never Used  . Alcohol Use: 4.2 oz/week    7 Cans of beer per week     1 beer per night     Colonoscopy: 2003  PAP:  Bone density: never  Lipid panel:  Allergies  Allergen Reactions  . Sulfa Antibiotics Other (See Comments)    Yeast infections and ulcers  . Sulfa Drugs Cross Reactors   . Tape Rash    Current Outpatient Prescriptions  Medication Sig Dispense Refill  . calcipotriene-betamethasone (TACLONEX) ointment Apply 1 application topically daily as needed. For psoriasis      . Cholecalciferol (VITAMIN D) 2000 UNITS CAPS Take 1 capsule by mouth daily.      . clobetasol (TEMOVATE) 0.05 % external solution Apply 1 application topically daily as needed. For psoriasis      . Glucosamine-Chondroit-Vit C-Mn (GLUCOSAMINE 1500 COMPLEX PO) Take 2 tablets by mouth daily.      . Multiple Vitamins-Minerals (MULTIVITAMINS THER. W/MINERALS) TABS Take 1 tablet by mouth daily.      . nitrofurantoin, macrocrystal-monohydrate, (MACROBID) 100 MG capsule Take 100 mg by mouth daily as needed. Takes when pt has sex      . non-metallic deodorant (ALRA) MISC Apply 1 application topically daily as needed.      . Nutritional Supplements (MELATONIN PO) Take 1-2 tablets by mouth at bedtime as needed. For sleep      . vitamin E 400 UNIT capsule Take 400 Units by mouth daily.      . Wound Cleansers (RADIAPLEX EX) Apply topically.      Marland Kitchen zolpidem (AMBIEN) 10 MG tablet Take 10 mg by mouth at bedtime as needed. For sleep        OBJECTIVE: Middle-aged white woman who appears well Filed Vitals:   01/26/12 1006  BP: 118/76  Pulse: 61  Temp: 97.7 F (36.5 C)  Resp: 20     Body mass index is 27.53 kg/(m^2).    ECOG FS: 0  Sclerae unicteric Oropharynx clear No cervical or supraclavicular adenopathy Lungs no rales or rhonchi Heart regular  rate and rhythm Abd benign MSK no focal spinal tenderness, no peripheral edema Neuro: nonfocal Breasts: The right breast is status post remote lumpectomy. There is no evidence of local recurrence. The left breast is status post lumpectomy and currently receiving radiation. There is actually very minimal erythema, mostly over the scar boost. There is no desquamation whatsoever. There is some induration beneath the scars, as expected. There is no evidence of local recurrence. The left exam is benign. LAB RESULTS: Lab Results  Component Value Date   WBC 4.1 01/26/2012   NEUTROABS 2.8 01/26/2012   HGB 12.7 01/26/2012   HCT 38.0 01/26/2012   MCV 91.8 01/26/2012   PLT 227 01/26/2012      Chemistry      Component Value Date/Time  NA 139 11/23/2011 1550   NA 139 10/13/2011 0816   K 4.3 11/23/2011 1550   K 4.2 10/13/2011 0816   CL 104 11/23/2011 1550   CL 101 10/13/2011 0816   CO2 27 11/23/2011 1550   CO2 30 10/13/2011 0816   BUN 16.0 11/23/2011 1550   BUN 20 10/13/2011 0816   CREATININE 1.0 11/23/2011 1550   CREATININE 0.89 10/13/2011 0816      Component Value Date/Time   CALCIUM 9.2 11/23/2011 1550   CALCIUM 9.6 10/13/2011 0816   ALKPHOS 43 11/23/2011 1550   ALKPHOS 45 10/13/2011 0816   AST 19 11/23/2011 1550   AST 18 10/13/2011 0816   ALT 15 11/23/2011 1550   ALT 12 10/13/2011 0816   BILITOT 0.40 11/23/2011 1550   BILITOT 0.4 10/13/2011 0816       Lab Results  Component Value Date   LABCA2 5 11/23/2011    No components found with this basename: [id number]    No results found for this basename: INR:1;PROTIME:1 in the last 168 hours  Urinalysis No results found for this basename: colorurine,  appearanceur,  labspec,  phurine,  glucoseu,  hgbur,  bilirubinur,  ketonesur,  proteinur,  urobilinogen,  nitrite,  leukocytesur    STUDIES: Bone density 12/29/2011 was normal  Nm Sentinel Node Inj-no Rpt (breast)  11/01/2011  CLINICAL DATA: left breast cancer   Sulfur colloid was injected  intradermally by the nuclear medicine  technologist for breast cancer sentinel node localization.     Mm Breast Surgical Specimen  11/01/2011  *RADIOLOGY REPORT*  Clinical Data:  Papilloma diagnosed by core needle biopsy in the left anterior upper outer quadrant. Carcinoma diagnosed by core needle biopsy in the left posterior upper outer quadrant.  LEFT BREAST NEEDLE LOCALIZATION WITH MAMMOGRAPHIC GUIDANCE AND SPECIMEN RADIOGRAPH X TWO:  Patient presents for needle localization prior to surgical excision. The patient and I discussed the procedure of needle localization including benefits and alternatives. We discussed the high likelihood of a successful procedure. We discussed the risks of the procedure, including infection, bleeding, tissue injury, and further surgery. Informed written consent was given.  Using mammographic guidance, sterile technique, 2% lidocaine and a 5 cm modified Kopans needle, the site of the papilloma in the left anterior upper outer quadrant was localized using a lateromedial approach.  Using mammographic guidance, sterile technique, 2% lidocaine and a 7-cm modified Kopans needle, the site of the carcinoma in the left posterior upper outer quadrant was localized using a lateromedial approach.  Films were labeled and sent with the patient surgery.  She tolerated the procedure well.  Two specimen radiographs were performed at [facility] operating room and confirm the left anterior upper outer quadrant biopsy site containing a wing clip and the left posterior upper outer quadrant biopsy site containing a ribbon clip to be present in the tissue sample.  The specimen is marked for pathology.  IMPRESSION: Needle localization in two separate locations of the left breast. No apparent complications.  Original Report Authenticated By: Daryl Eastern, M.D.   Mm Breast Surgical Specimen  11/01/2011  *RADIOLOGY REPORT*  Clinical Data:  Papilloma diagnosed by core needle biopsy in the  left anterior upper outer quadrant. Carcinoma diagnosed by core needle biopsy in the left posterior upper outer quadrant.  LEFT BREAST NEEDLE LOCALIZATION WITH MAMMOGRAPHIC GUIDANCE AND SPECIMEN RADIOGRAPH X TWO:  Patient presents for needle localization prior to surgical excision. The patient and I discussed the procedure of needle localization including benefits and  alternatives. We discussed the high likelihood of a successful procedure. We discussed the risks of the procedure, including infection, bleeding, tissue injury, and further surgery. Informed written consent was given.  Using mammographic guidance, sterile technique, 2% lidocaine and a 5 cm modified Kopans needle, the site of the papilloma in the left anterior upper outer quadrant was localized using a lateromedial approach.  Using mammographic guidance, sterile technique, 2% lidocaine and a 7-cm modified Kopans needle, the site of the carcinoma in the left posterior upper outer quadrant was localized using a lateromedial approach.  Films were labeled and sent with the patient surgery.  She tolerated the procedure well.  Two specimen radiographs were performed at [facility] operating room and confirm the left anterior upper outer quadrant biopsy site containing a wing clip and the left posterior upper outer quadrant biopsy site containing a ribbon clip to be present in the tissue sample.  The specimen is marked for pathology.  IMPRESSION: Needle localization in two separate locations of the left breast. No apparent complications.  Original Report Authenticated By: Daryl Eastern, M.D.   Mm Breast Wire Localization Left  11/01/2011  *RADIOLOGY REPORT*  Clinical Data:  Papilloma diagnosed by core needle biopsy in the left anterior upper outer quadrant. Carcinoma diagnosed by core needle biopsy in the left posterior upper outer quadrant.  LEFT BREAST NEEDLE LOCALIZATION WITH MAMMOGRAPHIC GUIDANCE AND SPECIMEN RADIOGRAPH X TWO:  Patient  presents for needle localization prior to surgical excision. The patient and I discussed the procedure of needle localization including benefits and alternatives. We discussed the high likelihood of a successful procedure. We discussed the risks of the procedure, including infection, bleeding, tissue injury, and further surgery. Informed written consent was given.  Using mammographic guidance, sterile technique, 2% lidocaine and a 5 cm modified Kopans needle, the site of the papilloma in the left anterior upper outer quadrant was localized using a lateromedial approach.  Using mammographic guidance, sterile technique, 2% lidocaine and a 7-cm modified Kopans needle, the site of the carcinoma in the left posterior upper outer quadrant was localized using a lateromedial approach.  Films were labeled and sent with the patient surgery.  She tolerated the procedure well.  Two specimen radiographs were performed at [facility] operating room and confirm the left anterior upper outer quadrant biopsy site containing a wing clip and the left posterior upper outer quadrant biopsy site containing a ribbon clip to be present in the tissue sample.  The specimen is marked for pathology.  IMPRESSION: Needle localization in two separate locations of the left breast. No apparent complications.  Original Report Authenticated By: Daryl Eastern, M.D.   Mm Breast Wire Localization Left  11/01/2011  *RADIOLOGY REPORT*  Clinical Data:  Papilloma diagnosed by core needle biopsy in the left anterior upper outer quadrant. Carcinoma diagnosed by core needle biopsy in the left posterior upper outer quadrant.  LEFT BREAST NEEDLE LOCALIZATION WITH MAMMOGRAPHIC GUIDANCE AND SPECIMEN RADIOGRAPH X TWO:  Patient presents for needle localization prior to surgical excision. The patient and I discussed the procedure of needle localization including benefits and alternatives. We discussed the high likelihood of a successful procedure. We  discussed the risks of the procedure, including infection, bleeding, tissue injury, and further surgery. Informed written consent was given.  Using mammographic guidance, sterile technique, 2% lidocaine and a 5 cm modified Kopans needle, the site of the papilloma in the left anterior upper outer quadrant was localized using a lateromedial approach.  Using mammographic  guidance, sterile technique, 2% lidocaine and a 7-cm modified Kopans needle, the site of the carcinoma in the left posterior upper outer quadrant was localized using a lateromedial approach.  Films were labeled and sent with the patient surgery.  She tolerated the procedure well.  Two specimen radiographs were performed at [facility] operating room and confirm the left anterior upper outer quadrant biopsy site containing a wing clip and the left posterior upper outer quadrant biopsy site containing a ribbon clip to be present in the tissue sample.  The specimen is marked for pathology.  IMPRESSION: Needle localization in two separate locations of the left breast. No apparent complications.  Original Report Authenticated By: Daryl Eastern, M.D.    ASSESSMENT: 56 y.o. status post Left lumpectomy and sentinel lymph node sampling 11/01/2011 for a pT1c pN0, stage IA invasive ductal carcinoma, grade 1, estrogen receptor 100% and progesterone receptor 39% positive, with no HER-2 amplification, and an MIB-1 of 8%.  (1) the Oncotype DX recurrent score is 13, predicting a distant recurrence rate of 8% in women like her who take tamoxifen for 5 years.  (2) adjuvant radiation completed 01/27/2012  (3) anastrozole started November 2013  PLAN: We went over her situation in detail, and she understands the prognosis based on Oncotype as soon as that she would be taking tamoxifen for 5 years, because that was the standard of care at that time. Since she has a normal bone density, and still has her uterus, my suggestion was that we try  anastrozole. We spent quite a bit of time discussing the possible toxicities side effects and complications of aromatase inhibitors as well as of tamoxifen.  After all this discussion she is comfortable starting anastrozole. She will call with any problems, particularly issues relating to hot flashes. She is a ready on an excellent walking program and on calcium and vitamin D supplementation. She will see [place] again in 3 months. If she is tolerating the anastrozole well, we'll start seeing her every 6 months at that point.  Laney Louderback C    01/26/2012

## 2012-01-27 ENCOUNTER — Ambulatory Visit: Payer: 59

## 2012-01-27 ENCOUNTER — Encounter: Payer: Self-pay | Admitting: Radiation Oncology

## 2012-01-27 ENCOUNTER — Ambulatory Visit
Admission: RE | Admit: 2012-01-27 | Discharge: 2012-01-27 | Disposition: A | Discharge status: Home / Self Care | Payer: 59 | Source: Ambulatory Visit | Attending: Radiation Oncology | Admitting: Radiation Oncology

## 2012-01-31 NOTE — Progress Notes (Signed)
  Radiation Oncology         [phone] ________________________________  Name: Jennifer Walker MRN: [id number]  Date: 01/27/2012  DOB: [birth year]  End of Treatment Note  Diagnosis:  T1 C N0 invasive ductal carcinoma of the left breast  Indication for treatment:  Curative     Radiation treatment dates:  12/13/2011-01/27/2012  Site/dose:    Left breast/ 45Gray @ 1.8 Gray per fraction x 25 fractions Left scar / 16 Gray at [employer] per fraction x 8 fractions  Beams/energy:  Opposed Tangents / 6 MV photons En face / 12 MeV electrons  Narrative: The patient tolerated radiation treatment relatively well.  She was able to even lose weight during her treatment.  She had the expected dermatitis during treatment.   Plan: The patient has completed radiation treatment. The patient will return to radiation oncology clinic for routine followup in one month. I advised them to call or return sooner if they have any questions or concerns related to their recovery or treatment.  ------------------------------------------------  Lurline Hare, MD

## 2012-02-18 ENCOUNTER — Telehealth: Payer: Self-pay | Admitting: Radiation Oncology

## 2012-02-18 ENCOUNTER — Ambulatory Visit
Admission: RE | Admit: 2012-02-18 | Discharge: 2012-02-18 | Disposition: A | Discharge status: Home / Self Care | Payer: 59 | Source: Ambulatory Visit | Attending: Radiation Oncology | Admitting: Radiation Oncology

## 2012-02-18 ENCOUNTER — Encounter: Payer: Self-pay | Admitting: Radiation Oncology

## 2012-02-18 VITALS — BP 102/73 | HR 68 | Temp 97.9°F | Resp 18 | Wt 199.9 lb

## 2012-02-18 DIAGNOSIS — C50419 Malignant neoplasm of upper-outer quadrant of unspecified female breast: Secondary | ICD-10-CM

## 2012-02-18 NOTE — Progress Notes (Signed)
Patient presents to the clinic today for follow up appointment with Dr. Michell Heinrich. Patient alert and oriented to person, place, and time. No distress noted. Steady gait noted. Pleasant affect noted. Patient denies pain at this time. Patient reports that hot flashes are more prevalent but, nothing she can't handle. Patient reports taking Arimidex as directed. Patient reports only faint hyperpigmentation of the left/treated breast. Patient reports that she continues to use radiaplex provided by our staff but, will transition to otc lotion once tube is complete. Patient reports that she feels a little sluggish and slightly depressed. Reported all findings to Dr. Michell Heinrich.

## 2012-02-18 NOTE — Progress Notes (Signed)
   Department of Radiation Oncology  Phone:  [phone] Fax:        [phone]   Name: Jennifer Walker   DOB: Jul 30, 1955  MRN: [id number]    Date: 02/18/2012  Follow Up Visit Note  Diagnosis: T1 C. N0 invasive ductal carcinoma of the left breast  Interval since last radiation: One month  Interval History: Christmas presents today for routine followup.  She is feeling well and doing well. She unfortunately has an increase in hot flashes which she attributes to the Arimidex. She still sleeping well though so that is not particularly worrisome to her. She is pleased with her cosmetic result.  Allergies:  Allergies  Allergen Reactions  . Sulfa Antibiotics Other (See Comments)    Yeast infections and ulcers  . Sulfa Drugs Cross Reactors   . Tape Rash    Medications:  Current Outpatient Prescriptions  Medication Sig Dispense Refill  . anastrozole (ARIMIDEX) 1 MG tablet Take 1 tablet (1 mg total) by mouth daily.  90 tablet  12  . calcipotriene-betamethasone (TACLONEX) ointment Apply 1 application topically daily as needed. For psoriasis      . Cholecalciferol (VITAMIN D) 2000 UNITS CAPS Take 1 capsule by mouth daily.      . clobetasol (TEMOVATE) 0.05 % external solution Apply 1 application topically daily as needed. For psoriasis      . Glucosamine-Chondroit-Vit C-Mn (GLUCOSAMINE 1500 COMPLEX PO) Take 2 tablets by mouth daily.      . Multiple Vitamins-Minerals (MULTIVITAMINS THER. W/MINERALS) TABS Take 1 tablet by mouth daily.      . nitrofurantoin, macrocrystal-monohydrate, (MACROBID) 100 MG capsule Take 100 mg by mouth daily as needed. Takes when pt has sex      . non-metallic deodorant (ALRA) MISC Apply 1 application topically daily as needed.      . Nutritional Supplements (MELATONIN PO) Take 1-2 tablets by mouth at bedtime as needed. For sleep      . vitamin E 400 UNIT capsule Take 400 Units by mouth daily.      . Wound Cleansers (RADIAPLEX EX) Apply topically.        Marland Kitchen zolpidem (AMBIEN) 10 MG tablet Take 10 mg by mouth at bedtime as needed. For sleep        Physical Exam:   vitals were not taken for this visit. Shows a well-healed breast. She has some very slight hyperpigmentation. She still has some edema in the skin.  IMPRESSION: Avalie is a 56 y.o. female status post breast conservation with resolving acute effects of treatment  PLAN:  She looks great. She is following up with medical oncology. I've educated her in terms of sun protection on the treated breast. She knows she can contact me if any questions or concerns.    Lurline Hare, MD

## 2012-02-18 NOTE — Telephone Encounter (Signed)
Patient a no show for 1400 follow up appointment with Dr. Michell Heinrich. Called patient's home and cell; no answer. Left message requesting return call to reschedule.

## 2012-05-01 ENCOUNTER — Other Ambulatory Visit: Payer: Self-pay | Admitting: *Deleted

## 2012-05-01 DIAGNOSIS — C50419 Malignant neoplasm of upper-outer quadrant of unspecified female breast: Secondary | ICD-10-CM

## 2012-05-02 ENCOUNTER — Encounter: Payer: Self-pay | Admitting: Physician Assistant

## 2012-05-02 ENCOUNTER — Ambulatory Visit (HOSPITAL_BASED_OUTPATIENT_CLINIC_OR_DEPARTMENT_OTHER): Payer: 59 | Admitting: Physician Assistant

## 2012-05-02 ENCOUNTER — Other Ambulatory Visit (HOSPITAL_BASED_OUTPATIENT_CLINIC_OR_DEPARTMENT_OTHER): Payer: 59 | Admitting: Lab

## 2012-05-02 ENCOUNTER — Telehealth: Payer: Self-pay | Admitting: Oncology

## 2012-05-02 VITALS — BP 115/73 | HR 69 | Temp 97.4°F | Resp 20 | Ht 71.0 in | Wt 207.6 lb

## 2012-05-02 DIAGNOSIS — Z170 Estrogen receptor positive status [ER+]: Secondary | ICD-10-CM

## 2012-05-02 DIAGNOSIS — C50419 Malignant neoplasm of upper-outer quadrant of unspecified female breast: Secondary | ICD-10-CM

## 2012-05-02 LAB — CBC WITH DIFFERENTIAL/PLATELET
Basophils Absolute: 0 10*3/uL (ref 0.0–0.1)
EOS%: 2.9 % (ref 0.0–7.0)
Eosinophils Absolute: 0.1 10*3/uL (ref 0.0–0.5)
HCT: 36.6 % (ref 34.8–46.6)
HGB: 12.5 g/dL (ref 11.6–15.9)
MCH: 31.1 pg (ref 25.1–34.0)
MCV: 90.7 fL (ref 79.5–101.0)
MONO%: 9.9 % (ref 0.0–14.0)
NEUT#: 3.5 10*3/uL (ref 1.5–6.5)
NEUT%: 72 % (ref 38.4–76.8)
Platelets: 302 10*3/uL (ref 145–400)
RDW: 12 % (ref 11.2–14.5)

## 2012-05-02 LAB — COMPREHENSIVE METABOLIC PANEL (CC13)
AST: 16 U/L (ref 5–34)
Albumin: 3.4 g/dL — ABNORMAL LOW (ref 3.5–5.0)
Alkaline Phosphatase: 74 U/L (ref 40–150)
BUN: 15.8 mg/dL (ref 7.0–26.0)
Calcium: 9.4 mg/dL (ref 8.4–10.4)
Creatinine: 0.8 mg/dL (ref 0.6–1.1)
Glucose: 85 mg/dl (ref 70–99)
Potassium: 4.1 mEq/L (ref 3.5–5.1)

## 2012-05-02 NOTE — Progress Notes (Signed)
ID: [name]   DOB: 1955-11-18  MR#: [id number]  CSN#:[id number]  PCP: Creola Corn MD GYN: Miguel Aschoff MD SU: Ovidio Kin MD   HISTORY OF PRESENT ILLNESS: "Bonita Quin" had routine screening mammography at [facility] 09/22/2011 showing a possible mass in the left breast. Diagnostic left mammography and ultrasonography at the [facility] 10/05/2011 confirmed an irregular mass in the left upper outer quadrant which was not palpable. Ultrasound showed this to be irregular, solid, and to measure 1.2 cm. The left axilla was unremarkable.  Biopsy of the left breast mass on the same day showed ([id number]) an invasive ductal carcinoma, grade 1, estrogen receptor 100% and progesterone receptor 39% positive, with no HER-2 amplification, and an MIB-1 of 1.32. Bonita Quin proceeded to bilateral breast MRI 10/08/2011. This confirmed a 1.9 cm enhancing mass in the upper outer portion of the left breast, and found a second, 7 mm mass in the more anterior aspect of the same breast. This second mass was identified by ultrasonography 10/12/2011 and biopsy showed ([id number]) an intraductal papilloma.  Accordingly, on 11/01/2011 the patient underwent double lumpectomy (left upper and left inferior breast) with left axillary sentinel lymph node biopsy under Ovidio Kin. The final pathology ([id number]) showed the left inferior lumpectomy to consist of a fibroadenoma. The left upper lumpectomy showed a 1.8 cm invasive ductal carcinoma, grade 1, with negative margins (closest distance 8 mm) and a negative sentinel lymph node. The patient's subsequent history is as detailed below.  INTERVAL HISTORY: Bonita Quin returns today for routine followup of her left breast carcinoma. Interval history is remarkable for Bonita Quin having started on anastrozole in November 2013. She continues at 1 mg daily, and is tolerating it reasonably well.   She continues to have some hot flashes. They were worse when she first began the  anastrozole, but has slowly improved. She feels like her energy level is decreased and she "requires more sleep".  Her biggest complaint is vaginal dryness which was actually problem even prior to the anastrozole. Fortunately, she's had no new myalgias or arthralgias.  REVIEW OF SYSTEMS: Bonita Quin has had no fevers or chills. She denies any skin changes. She's had no abnormal bleeding. She's eating and drinking well no nausea no change in bowel or bladder habits. She denies any cough, shortness of breath, chest pain, or palpitations. She's had no abnormal headaches or dizziness.  A detailed review of systems is otherwise noncontributory.   PAST MEDICAL HISTORY: Past Medical History  Diagnosis Date  . Insomnia   . H/O colonoscopy 2003  . Wears glasses   . Fatigue   . Psoriasis   . Hot flashes   . PONV (postoperative nausea and vomiting)   . Breast cancer     Invasive ductal ca grade 1 er/pr positive, her2  negative    PAST SURGICAL HISTORY: Past Surgical History  Procedure Date  . Knee surgery 2005, 2007    bilateral   . Dilation and curettage of uterus   . Tumor removal 1975    right breast  . Lymph gland excision 1978    right axillary  . Mandible surgery 1982  . Breast surgery 10/1976    right breast benign tumor  . Breast lumpectomy 11/01/2011    left breast lumpectomy    FAMILY HISTORY Family History  Problem Relation Age of Onset  . Breast cancer Mother 37    Inflammatory breast cancer  . Cancer Mother     breast  . Heart attack Father   .  Heart disease Father   . Cancer Cousin     breast   the patient's father died at the age of 62 from a myocardial infarction, in the setting of a motorcycle accident. The patient's mother died from inflammatory breast cancer at the age of 66. It had been diagnosed at age 35. The patient has one brother and one sister. There is no other breast or ovarian cancer or other cancer in the immediate family.  GYNECOLOGIC  HISTORY: Menarche age 81, menopause 2009. She did not take hormone replacement. First live birth was age 79. She is GX P2.  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 Her husband Molly Maduro works as a Emergency planning/management officer for [employer]. Their children are Dannielle Karvonen Junior ("Wes"), 15, and Pilar Grammes [place]"), 13. The patient attends a local [employer]   ADVANCED DIRECTIVES: Not in place  HEALTH MAINTENANCE: History  Substance Use Topics  . Smoking status: Never Smoker   . Smokeless tobacco: Never Used  . Alcohol Use: 4.2 oz/week    7 Cans of beer per week     Comment: 1 beer per night     Colonoscopy: 2003  PAP:  Bone density: never  Lipid panel:  Allergies  Allergen Reactions  . Sulfa Antibiotics Other (See Comments)    Yeast infections and ulcers  . Sulfa Drugs Cross Reactors   . Tape Rash    Current Outpatient Prescriptions  Medication Sig Dispense Refill  . anastrozole (ARIMIDEX) 1 MG tablet Take 1 tablet (1 mg total) by mouth daily.  90 tablet  12  . calcipotriene-betamethasone (TACLONEX) ointment Apply 1 application topically daily as needed. For psoriasis      . clobetasol (TEMOVATE) 0.05 % external solution Apply 1 application topically daily as needed. For psoriasis      . Glucosamine-Chondroit-Vit C-Mn (GLUCOSAMINE 1500 COMPLEX PO) Take 2 tablets by mouth daily.      . Multiple Vitamins-Minerals (MULTIVITAMINS THER. W/MINERALS) TABS Take 1 tablet by mouth daily.      . nitrofurantoin, macrocrystal-monohydrate, (MACROBID) 100 MG capsule Take 100 mg by mouth daily as needed. Takes when pt has sex      . non-metallic deodorant (ALRA) MISC Apply 1 application topically daily as needed.      . Nutritional Supplements (MELATONIN PO) Take 1-2 tablets by mouth at bedtime as needed. For sleep      . Wound Cleansers (RADIAPLEX EX) Apply topically.      Marland Kitchen zolpidem (AMBIEN) 10 MG tablet Take 10 mg by mouth  at bedtime as needed. For sleep      . Cholecalciferol (VITAMIN D) 2000 UNITS CAPS Take 1 capsule by mouth daily.      . vitamin E 400 UNIT capsule Take 400 Units by mouth daily.        OBJECTIVE: Middle-aged white woman who appears well Filed Vitals:   05/02/12 1045  BP: 115/73  Pulse: 69  Temp: 97.4 F (36.3 C)  Resp: 20     Body mass index is 28.95 kg/(m^2).    ECOG FS: 0 Filed Weights   05/02/12 1045  Weight: 207 lb 9.6 oz (94.167 kg)   Sclerae unicteric Oropharynx clear No cervical or supraclavicular adenopathy Lungs no rales or rhonchi Heart regular rate and rhythm Abdomen is benign MSK no focal spinal tenderness, no peripheral edema Neuro: nonfocal, well oriented Breasts: The right breast is unremarkable. The left breast is status post lumpectomy  with no evidence of local recurrence. Axillae are benign bilaterally with no palpable adenopathy.    LAB RESULTS: Lab Results  Component Value Date   WBC 4.8 05/02/2012   NEUTROABS 3.5 05/02/2012   HGB 12.5 05/02/2012   HCT 36.6 05/02/2012   MCV 90.7 05/02/2012   PLT 302 05/02/2012      Chemistry      Component Value Date/Time   NA 142 05/02/2012 1028   NA 139 10/13/2011 0816   K 4.1 05/02/2012 1028   K 4.2 10/13/2011 0816   CL 102 05/02/2012 1028   CL 101 10/13/2011 0816   CO2 28 05/02/2012 1028   CO2 30 10/13/2011 0816   BUN 15.8 05/02/2012 1028   BUN 20 10/13/2011 0816   CREATININE 0.8 05/02/2012 1028   CREATININE 0.89 10/13/2011 0816      Component Value Date/Time   CALCIUM 9.4 05/02/2012 1028   CALCIUM 9.6 10/13/2011 0816   ALKPHOS 74 05/02/2012 1028   ALKPHOS 45 10/13/2011 0816   AST 16 05/02/2012 1028   AST 18 10/13/2011 0816   ALT 15 05/02/2012 1028   ALT 12 10/13/2011 0816   BILITOT 0.39 05/02/2012 1028   BILITOT 0.4 10/13/2011 0816       Lab Results  Component Value Date   LABCA2 5 11/23/2011     STUDIES: Bone density 12/29/2011 was normal     ASSESSMENT: 57 y.o. status post Left lumpectomy and sentinel lymph node  sampling 11/01/2011 for a pT1c pN0, stage IA invasive ductal carcinoma, grade 1, estrogen receptor 100% and progesterone receptor 39% positive, with no HER-2 amplification, and an MIB-1 of 8%.  (1) the Oncotype DX recurrent score is 13, predicting a distant recurrence rate of 8% in women like her who take tamoxifen for 5 years.  (2) adjuvant radiation completed 01/27/2012  (3) anastrozole started November 2013  PLAN:  With regards to her breast cancer, Bonita Quin appears to be doing very well and will continue on the anastrozole 1 mg daily. We discussed treatments for vaginal dryness, including vaginal moisturizer such as Replens or natural coconut oil. Fortunately, her hot flashes are improving, and she is having no significant arthralgias. She continues exercising, and is taking calcium with vitamin D to prevent osteoporosis.  Bonita Quin is due for her next mammogram in June, and return to see [place] for routine followup in 6 months. She voices her understanding and agreement with this plan and noticed called any changes or problems.  Stepfon Rawles    05/02/2012

## 2012-05-02 NOTE — Telephone Encounter (Signed)
gv pt appt schedule for July and mammo for 6/27 @ [facility]. appts scheduled for July due to will be out of country 1st 3wks of August.

## 2012-06-30 ENCOUNTER — Encounter: Payer: Self-pay | Admitting: Internal Medicine

## 2012-07-26 ENCOUNTER — Ambulatory Visit (AMBULATORY_SURGERY_CENTER): Payer: 59 | Admitting: *Deleted

## 2012-07-26 VITALS — Ht 71.0 in | Wt 213.8 lb

## 2012-07-26 DIAGNOSIS — Z1211 Encounter for screening for malignant neoplasm of colon: Secondary | ICD-10-CM

## 2012-07-26 MED ORDER — MOVIPREP 100 G PO SOLR: ORAL | Status: DC

## 2012-07-27 ENCOUNTER — Encounter: Payer: Self-pay | Admitting: Internal Medicine

## 2012-08-16 ENCOUNTER — Encounter: Payer: Self-pay | Admitting: Internal Medicine

## 2012-08-16 ENCOUNTER — Ambulatory Visit (AMBULATORY_SURGERY_CENTER): Payer: 59 | Admitting: Internal Medicine

## 2012-08-16 VITALS — BP 132/69 | HR 57 | Temp 98.1°F | Resp 19 | Ht 71.0 in | Wt 213.0 lb

## 2012-08-16 DIAGNOSIS — Z1211 Encounter for screening for malignant neoplasm of colon: Secondary | ICD-10-CM

## 2012-08-16 DIAGNOSIS — D126 Benign neoplasm of colon, unspecified: Secondary | ICD-10-CM

## 2012-08-16 MED ORDER — SODIUM CHLORIDE 0.9 % IV SOLN: 500.0000 mL | INTRAVENOUS | Status: DC

## 2012-08-16 NOTE — Op Note (Signed)
[facility] [address] [place], [address]   COLONOSCOPY PROCEDURE REPORT  PATIENT: Jennifer, Walker  MR#: [id number] BIRTHDATE: March 08, 1956 , 56  yrs. old GENDER: Female ENDOSCOPIST: Beverley Fiedler, MD REFERRED [id number] Timothy Lasso, M.D. PROCEDURE DATE:  08/16/2012 PROCEDURE:   Colonoscopy with cold biopsy polypectomy ASA CLASS:   Class III INDICATIONS:average risk screening and Last colonoscopy performed 2003 (performed for rectal bleeding). MEDICATIONS: MAC sedation, administered by CRNA  DESCRIPTION OF PROCEDURE:   After the risks benefits and alternatives of the procedure were thoroughly explained, informed consent was obtained.  A digital rectal exam revealed no rectal mass.   The LB [id number]  endoscope was introduced through the anus and advanced to the cecum, which was identified by both the appendix and ileocecal valve. No adverse events experienced. The quality of the prep was good, using MoviPrep  The instrument was then slowly withdrawn as the colon was fully examined.   COLON FINDINGS: A sessile polyp measuring 4 mm in size was found at the cecum.  A polypectomy was performed with cold forceps.  The resection was complete and the polyp tissue was completely retrieved.   The colon was otherwise normal.  There was no diverticulosis, inflammation, other polyps or cancers seen. Retroflexed views revealed no abnormalities. The time to cecum=5 minutes 27 seconds.  Withdrawal time=13 minutes 26 seconds.  The scope was withdrawn and the procedure completed.  COMPLICATIONS: There were no complications.  ENDOSCOPIC IMPRESSION: 1.   Sessile polyp measuring 4 mm in size was found at the cecum; polypectomy was performed with cold forceps 2.   The colon was otherwise normal  RECOMMENDATIONS: 1.  Await pathology results 2.  If the polyp removed today is proven to be an adenomatous (pre-cancerous) polyp, you will need a repeat colonoscopy in 5 years.   Otherwise you should continue to follow colorectal cancer screening guidelines for "routine risk" patients with colonoscopy in 10 years.  You will receive a letter within 1-2 weeks with the results of your biopsy as well as final recommendations.  Please call my office if you have not received a letter after 3 weeks.   eSigned:  Beverley Fiedler, MD 08/16/2012 11:12 AM   cc: The Patient

## 2012-08-16 NOTE — Progress Notes (Signed)
Patient did not experience any of the following events: a burn prior to discharge; a fall within the facility; wrong site/side/patient/procedure/implant event; or a hospital transfer or hospital admission upon discharge from the facility. (G8907) Patient did not have preoperative order for IV antibiotic SSI prophylaxis. (G8918)  

## 2012-08-16 NOTE — Patient Instructions (Addendum)
Discharge instructions given with verbal understanding. Handout on polyps given. Resume previous medications. YOU HAD AN ENDOSCOPIC PROCEDURE TODAY AT THE [facility]: Refer to the procedure report that was given to you for any specific questions about what was found during the examination.  If the procedure report does not answer your questions, please call your gastroenterologist to clarify.  If you requested that your care partner not be given the details of your procedure findings, then the procedure report has been included in a sealed envelope for you to review at your convenience later.  YOU SHOULD EXPECT: Some feelings of bloating in the abdomen. Passage of more gas than usual.  Walking can help get rid of the air that was put into your GI tract during the procedure and reduce the bloating. If you had a lower endoscopy (such as a colonoscopy or flexible sigmoidoscopy) you may notice spotting of blood in your stool or on the toilet paper. If you underwent a bowel prep for your procedure, then you may not have a normal bowel movement for a few days.  DIET: Your first meal following the procedure should be a light meal and then it is ok to progress to your normal diet.  A half-sandwich or bowl of soup is an example of a good first meal.  Heavy or fried foods are harder to digest and may make you feel nauseous or bloated.  Likewise meals heavy in dairy and vegetables can cause extra gas to form and this can also increase the bloating.  Drink plenty of fluids but you should avoid alcoholic beverages for 24 hours.  ACTIVITY: Your care partner should take you home directly after the procedure.  You should plan to take it easy, moving slowly for the rest of the day.  You can resume normal activity the day after the procedure however you should NOT DRIVE or use heavy machinery for 24 hours (because of the sedation medicines used during the test).    SYMPTOMS TO REPORT IMMEDIATELY: A  gastroenterologist can be reached at any hour.  During normal business hours, 8:30 AM to 5:00 PM Monday through Friday, call [phone].  After hours and on weekends, please call the GI answering service at [phone] who will take a message and have the physician on call contact you.   Following lower endoscopy (colonoscopy or flexible sigmoidoscopy):  Excessive amounts of blood in the stool  Significant tenderness or worsening of abdominal pains  Swelling of the abdomen that is new, acute  Fever of 100F or higher  FOLLOW UP: If any biopsies were taken you will be contacted by phone or by letter within the next 1-3 weeks.  Call your gastroenterologist if you have not heard about the biopsies in 3 weeks.  Our staff will call the home number listed on your records the next business day following your procedure to check on you and address any questions or concerns that you may have at that time regarding the information given to you following your procedure. This is a courtesy call and so if there is no answer at the home number and we have not heard from you through the emergency physician on call, we will assume that you have returned to your regular daily activities without incident.  SIGNATURES/CONFIDENTIALITY: You and/or your care partner have signed paperwork which will be entered into your electronic medical record.  These signatures attest to the fact that that the information above on your After Visit Summary has   been reviewed and is understood.  Full responsibility of the confidentiality of this discharge information lies with you and/or your care-partner. 

## 2012-08-16 NOTE — Progress Notes (Signed)
Called to room to assist during endoscopic procedure.  Patient ID and intended procedure confirmed with present staff. Received instructions for my participation in the procedure from the performing physician. ewm 

## 2012-08-17 ENCOUNTER — Telehealth: Payer: Self-pay | Admitting: *Deleted

## 2012-08-17 NOTE — Telephone Encounter (Signed)
  Follow up Call-  Call back number 08/16/2012  Post procedure Call Back phone  # [phone]   [phone]  Permission to leave phone message Yes     Patient questions:  Do you have a fever, pain , or abdominal swelling? no Pain Score  0 *  Have you tolerated food without any problems? yes  Have you been able to return to your normal activities? yes  Do you have any questions about your discharge instructions: Diet   no Medications  no Follow up visit  no  Do you have questions or concerns about your Care? no  Actions: * If pain score is 4 or above: No action needed, pain <4.

## 2012-08-23 ENCOUNTER — Encounter: Payer: Self-pay | Admitting: Internal Medicine

## 2012-09-04 ENCOUNTER — Telehealth: Payer: Self-pay | Admitting: Internal Medicine

## 2012-09-04 NOTE — Telephone Encounter (Signed)
Pt and I talked for > 20 minutes about her S&S. She reports ever since her COLON in late May, she's had more bloating, pressure and a tightness in her upper abdominal area. She doesn't burp and doesn't think it's heartburn/indigestions. The area of concern is above her belly button in the center; food/diet does not matter. Her bowels are regular . Pt discussed her breast cancer dx with radiation and the fact she has been reading on the internet. She thinks she has ovarian ca d/t her s&s and the fact she did not have genetic testing and her mom died of inflammatory breast cancer. When asked about her stress level she states she has a 55 year old. She thinks it all started after her COLON and we talked about intestinal flora and the need for probiotic therapy. Offered her an appt many times, but she has a call in to the [facility] and will see what they say. Encouraged pt not to dispell cardiac issues since women's MI s&s are so vague. Called ended with pt trying a probiotic and maybe a PPI and she understands she may call back to see Dr Rhea Belton if no better.

## 2012-09-05 ENCOUNTER — Other Ambulatory Visit: Payer: Self-pay | Admitting: *Deleted

## 2012-09-05 DIAGNOSIS — C50412 Malignant neoplasm of upper-outer quadrant of left female breast: Secondary | ICD-10-CM

## 2012-09-05 MED ORDER — ANASTROZOLE 1 MG PO TABS: 1.0000 mg | ORAL_TABLET | Freq: Every day | ORAL | Status: DC

## 2012-09-07 ENCOUNTER — Telehealth: Payer: Self-pay | Admitting: *Deleted

## 2012-09-07 NOTE — Telephone Encounter (Signed)
Message left late 6/11 for a return call due to concerns post colonoscopy of discomfort " that I am concerned I could have ovarian cancer ".  This RN returned call to given number of [phone] and obtained identified VM. Message left for pt to call this RN to discuss her concerns for appropriate recommendations.

## 2012-09-25 ENCOUNTER — Ambulatory Visit
Admission: RE | Admit: 2012-09-25 | Discharge: 2012-09-25 | Disposition: A | Discharge status: Home / Self Care | Payer: 59 | Source: Ambulatory Visit | Attending: Physician Assistant | Admitting: Physician Assistant

## 2012-09-25 DIAGNOSIS — C50419 Malignant neoplasm of upper-outer quadrant of unspecified female breast: Secondary | ICD-10-CM

## 2012-09-27 ENCOUNTER — Other Ambulatory Visit: Payer: Self-pay | Admitting: Obstetrics and Gynecology

## 2012-09-27 DIAGNOSIS — R109 Unspecified abdominal pain: Secondary | ICD-10-CM

## 2012-10-04 ENCOUNTER — Ambulatory Visit
Admission: RE | Admit: 2012-10-04 | Discharge: 2012-10-04 | Disposition: A | Discharge status: Home / Self Care | Payer: 59 | Source: Ambulatory Visit | Attending: Obstetrics and Gynecology | Admitting: Obstetrics and Gynecology

## 2012-10-04 DIAGNOSIS — R109 Unspecified abdominal pain: Secondary | ICD-10-CM

## 2012-10-04 MED ORDER — IOHEXOL 300 MG/ML  SOLN
125.0000 mL | Freq: Once | INTRAMUSCULAR | Status: AC | PRN
  Administered 2012-10-04: 125 mL via INTRAVENOUS

## 2012-10-19 ENCOUNTER — Other Ambulatory Visit (HOSPITAL_BASED_OUTPATIENT_CLINIC_OR_DEPARTMENT_OTHER): Payer: 59 | Admitting: Lab

## 2012-10-19 DIAGNOSIS — C50419 Malignant neoplasm of upper-outer quadrant of unspecified female breast: Secondary | ICD-10-CM

## 2012-10-19 LAB — COMPREHENSIVE METABOLIC PANEL (CC13)
Albumin: 4.1 g/dL (ref 3.5–5.0)
BUN: 18 mg/dL (ref 7.0–26.0)
Calcium: 9.9 mg/dL (ref 8.4–10.4)
Chloride: 103 mEq/L (ref 98–109)
Creatinine: 1 mg/dL (ref 0.6–1.1)
Glucose: 96 mg/dl (ref 70–140)
Potassium: 4.4 mEq/L (ref 3.5–5.1)

## 2012-10-19 LAB — CBC WITH DIFFERENTIAL/PLATELET
Basophils Absolute: 0 10*3/uL (ref 0.0–0.1)
Eosinophils Absolute: 0.1 10*3/uL (ref 0.0–0.5)
HCT: 40.2 % (ref 34.8–46.6)
HGB: 13.3 g/dL (ref 11.6–15.9)
MCH: 30.3 pg (ref 25.1–34.0)
MCV: 91.6 fL (ref 79.5–101.0)
NEUT#: 3 10*3/uL (ref 1.5–6.5)
NEUT%: 73 % (ref 38.4–76.8)
RDW: 12.2 % (ref 11.2–14.5)
lymph#: 0.6 10*3/uL — ABNORMAL LOW (ref 0.9–3.3)

## 2012-10-26 ENCOUNTER — Ambulatory Visit (HOSPITAL_BASED_OUTPATIENT_CLINIC_OR_DEPARTMENT_OTHER): Payer: 59 | Admitting: Oncology

## 2012-10-26 ENCOUNTER — Telehealth: Payer: Self-pay | Admitting: Oncology

## 2012-10-26 VITALS — BP 123/79 | HR 99 | Temp 99.2°F | Resp 20 | Ht 71.0 in | Wt 211.6 lb

## 2012-10-26 DIAGNOSIS — C50419 Malignant neoplasm of upper-outer quadrant of unspecified female breast: Secondary | ICD-10-CM

## 2012-10-26 MED ORDER — OMEPRAZOLE 40 MG PO CPDR: 40.0000 mg | DELAYED_RELEASE_CAPSULE | Freq: Every day | ORAL | Status: DC

## 2012-10-26 NOTE — Progress Notes (Signed)
ID: Jennifer Walker   DOB: Jul 14, 1955  MR#: [id number]  WUJ#:[id number]  PCP: Jennifer Corn MD GYN: Jennifer Aschoff MD SU: Jennifer Kin MD   HISTORY OF PRESENT ILLNESS: "Jennifer Walker" had routine screening mammography at [facility] 09/22/2011 showing a possible mass in the left breast. Diagnostic left mammography and ultrasonography at the [facility] 10/05/2011 confirmed an irregular mass in the left upper outer quadrant which was not palpable. Ultrasound showed this to be irregular, solid, and to measure 1.2 cm. The left axilla was unremarkable.  Biopsy of the left breast mass on the same day showed ([id number]) an invasive ductal carcinoma, grade 1, estrogen receptor 100% and progesterone receptor 39% positive, with no HER-2 amplification, and an MIB-1 of 1.32. Jennifer Walker proceeded to bilateral breast MRI 10/08/2011. This confirmed a 1.9 cm enhancing mass in the upper outer portion of the left breast, and found a second, 7 mm mass in the more anterior aspect of the same breast. This second mass was identified by ultrasonography 10/12/2011 and biopsy showed ([id number]) an intraductal papilloma.  Accordingly, on 11/01/2011 the patient underwent double lumpectomy (left upper and left inferior breast) with left axillary sentinel lymph node biopsy under Jennifer Walker. The final pathology ([id number]) showed the left inferior lumpectomy to consist of a fibroadenoma. The left upper lumpectomy showed a 1.8 cm invasive ductal carcinoma, grade 1, with negative margins (closest distance 8 mm) and a negative sentinel lymph node. The patient's subsequent history is as detailed below.  INTERVAL HISTORY: Jennifer Walker returns today for followup of her breast cancer. The interval history is significant for continuing abdominal discomfort following her colonoscopy in may. She took Prilosec 20 mg daily for 2 or 3 weeks with some improvement, but without resolution. She just had a CT scan of the abdomen which shows no evidence of  obstruction or other abdominal abnormality. It did show an 8 mm lingular opacity felt likely to be benign, but which will need followup.  REVIEW OF SYSTEMS: Jennifer Walker sleeps poorly, and has intermittent pain under the breasts bilaterally. She complains of heartburn. She denies any shortness of breath, cough, phlegm production, or pleurisy. Of course she continues to have issues regarding psoriasis. She feels more forgetful than before. She has never been regular but now perhaps her.bowel movements are less predictable. There has been no bright red blood parenchyma or melena. A detailed review of systems was otherwise noncontributory  PAST MEDICAL HISTORY: Past Medical History  Diagnosis Date  . Insomnia   . H/O colonoscopy 2003  . Wears glasses   . Fatigue   . Psoriasis   . Hot flashes   . PONV (postoperative nausea and vomiting)   . Breast cancer     Invasive ductal ca grade 1 er/pr positive, her2  negative    PAST SURGICAL HISTORY: Past Surgical History  Procedure Laterality Date  . Knee surgery  2005, 2007    bilateral   . Dilation and curettage of uterus    . Tumor removal  1975    right breast  . Lymph gland excision  1978    right axillary  . Mandible surgery  1982  . Breast surgery  10/1976    right breast benign tumor  . Breast lumpectomy  11/01/2011    left breast lumpectomy    FAMILY HISTORY Family History  Problem Relation Age of Onset  . Breast cancer Mother 59    Inflammatory breast cancer  . Cancer Mother     breast  .  Heart attack Father   . Heart disease Father   . Cancer Cousin     breast  . Colon cancer Neg Hx   . Colon polyps Neg Hx   . Stomach cancer Neg Hx   . Rectal cancer Neg Hx    the patient's father died at the age of 32 from a myocardial infarction, in the setting of a motorcycle accident. The patient's mother died from inflammatory breast cancer at the age of 51. It had been diagnosed at age 9. The patient has one brother and one sister.  There is no other breast or ovarian cancer or other cancer in the immediate family.  GYNECOLOGIC HISTORY: Menarche age 36, menopause 2009. She did not take hormone replacement. First live birth was age 50. She is GX P2.  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 Her husband Jennifer Walker works as a Emergency planning/management officer for [employer]. Their children are Jennifer Walker ("Wes"), 15, and Jennifer Walker"), 13. The patient attends a local [employer]   ADVANCED DIRECTIVES: Not in place  HEALTH MAINTENANCE: History  Substance Use Topics  . Smoking status: Never Smoker   . Smokeless tobacco: Never Used  . Alcohol Use: 4.2 oz/week    7 Cans of beer per week     Comment: 1 beer per night     Colonoscopy: 2003  PAP:  Bone density: never  Lipid panel:  Allergies  Allergen Reactions  . Sulfa Antibiotics Other (See Comments)    Yeast infections and ulcers  . Sulfa Drugs Cross Reactors   . Tape Rash    Current Outpatient Prescriptions  Medication Sig Dispense Refill  . anastrozole (ARIMIDEX) 1 MG tablet Take 1 tablet (1 mg total) by mouth daily.  90 tablet  0  . calcipotriene-betamethasone (TACLONEX) ointment Apply 1 application topically daily as needed. For psoriasis      . Cholecalciferol (VITAMIN D) 2000 UNITS CAPS Take 1 capsule by mouth daily.      . clobetasol (TEMOVATE) 0.05 % external solution Apply 1 application topically daily as needed. For psoriasis      . Glucosamine-Chondroit-Vit C-Mn (GLUCOSAMINE 1500 COMPLEX PO) Take 2 tablets by mouth daily.      . Multiple Vitamins-Minerals (MULTIVITAMINS THER. W/MINERALS) TABS Take 1 tablet by mouth daily.      . nitrofurantoin, macrocrystal-monohydrate, (MACROBID) 100 MG capsule Take 100 mg by mouth daily as needed. Takes when pt has sex      . Nutritional Supplements (MELATONIN PO) Take 1-2 tablets by mouth at bedtime as needed. For sleep      . vitamin E 400  UNIT capsule Take 400 Units by mouth daily.      Marland Kitchen zolpidem (AMBIEN) 10 MG tablet Take 10 mg by mouth at bedtime as needed. For sleep       No current facility-administered medications for this visit.    OBJECTIVE: Middle-aged white woman in no acute distress Filed Vitals:   10/26/12 1156  BP: 123/79  Pulse: 99  Temp: 99.2 F (37.3 C)  Resp: 20     Body mass index is 29.53 kg/(m^2).    ECOG FS: 0 Filed Weights   10/26/12 1156  Weight: 211 lb 9.6 oz (95.981 kg)   Sclerae unicteric, pupils equal round and reactive to light Oropharynx clear No cervical or supraclavicular adenopathy Lungs no rales or rhonchi Heart regular rate and rhythm Abdomen soft, nontender, positive bowel sounds,  no masses palpated MSK no focal spinal tenderness, no peripheral edema Neuro: nonfocal, well oriented Breasts: The right breast is unremarkable. The left breast is status post lumpectomy and radiation. There is no evidence of local recurrence. The left axilla is benign.   LAB RESULTS: Lab Results  Component Value Date   WBC 4.2 10/19/2012   NEUTROABS 3.0 10/19/2012   HGB 13.3 10/19/2012   HCT 40.2 10/19/2012   MCV 91.6 10/19/2012   PLT 243 10/19/2012      Chemistry      Component Value Date/Time   NA 140 10/19/2012 1132   NA 139 10/13/2011 0816   K 4.4 10/19/2012 1132   K 4.2 10/13/2011 0816   CL 102 05/02/2012 1028   CL 101 10/13/2011 0816   CO2 27 10/19/2012 1132   CO2 30 10/13/2011 0816   BUN 18.0 10/19/2012 1132   BUN 20 10/13/2011 0816   CREATININE 1.0 10/19/2012 1132   CREATININE 0.89 10/13/2011 0816      Component Value Date/Time   CALCIUM 9.9 10/19/2012 1132   CALCIUM 9.6 10/13/2011 0816   ALKPHOS 49 10/19/2012 1132   ALKPHOS 45 10/13/2011 0816   AST 22 10/19/2012 1132   AST 18 10/13/2011 0816   ALT 18 10/19/2012 1132   ALT 12 10/13/2011 0816   BILITOT 0.56 10/19/2012 1132   BILITOT 0.4 10/13/2011 0816       Lab Results  Component Value Date   LABCA2 5 11/23/2011     STUDIES: Bone  density 12/29/2011 was normal  Ct Abdomen Pelvis W Contrast  10/04/2012   *RADIOLOGY REPORT*  Clinical Data: Endogastric pain, gas/bloating, nausea, constipation.  CT ABDOMEN AND PELVIS WITH CONTRAST  Technique:  Multidetector CT imaging of the abdomen and pelvis was performed following the standard protocol during bolus administration of intravenous contrast.  Contrast: OMNIPAQUE IOHEXOL 300 MG/ML  SOLN  Comparison: None.  Findings: 8 x 4 mm patchy/nodular opacity in the lingula (series 4/image 3).  Liver, spleen, pancreas, and adrenal glands are within normal limits.  Gallbladder is unremarkable.  No intrahepatic or extrahepatic ductal dilatation.  Kidneys are within normal limits.  No hydronephrosis.  No evidence of bowel obstruction.  Normal appendix.  Moderate colonic stool burden.  No evidence of abdominal aortic aneurysm.  No abdominopelvic ascites.  No suspicious abdominopelvic lymphadenopathy.  Uterus is unremarkable.  No adnexal masses.  Bladder is within normal limits.  Moderate degenerative changes at L4-5.  IMPRESSION: No evidence of bowel obstruction.  Normal appendix.  Moderate colonic stool burden, suggesting constipation.  8 x 4 mm patchy/nodular opacity in the lingula.  If high risk for primary bronchogenic neoplasm, initial follow-up CT chest is suggested in 6-12 months.  If low risk, a single follow-up CT chest is suggested in 12 months.  This recommendation follows the consensus statement: Guidelines for Management of Small Pulmonary Nodules Detected on CT Scans: A Statement from the Fleischner Society as published in Radiology 2005; 237:395-400.   Original Report Authenticated By: Charline Bills, M.D.    ASSESSMENT: 57 y.o. status post Left lumpectomy and sentinel lymph node sampling 11/01/2011 for a pT1c pN0, stage IA invasive ductal carcinoma, grade 1, estrogen receptor 100% and progesterone receptor 39% positive, with no HER-2 amplification, and an MIB-1 of 8%.  (1) the  Oncotype DX recurrent score is 13, predicting a distant recurrence rate of 8% in women like her who take tamoxifen for 5 years.  (2) adjuvant radiation completed 01/27/2012  (3) anastrozole started November  2013  (4) incidentally noted 8mm lingular opacity on CT scan July 2014  PLAN:  I think what she is experiencing now is fairly standard reflux, and I suggested she take 40 mg of Prilosec every night for the next month. She is welcome to continue that if it works otherwise of course she will stop it.   The lingular opacity is likely of no great consequence, but I am going to obtain a chest x-ray in 6 months just to make sure there has not been progression. She will need a repeat chest CT scan in one year as suggested by radiology.   From a breast cancer point of view the plan is to continue anastrozole until November of 2018. She will be due for repeat bone density October of 2015. She knows to call for any problems that may develop before her next visit here  Baelyn Doring C    10/26/2012

## 2012-10-27 ENCOUNTER — Other Ambulatory Visit: Payer: Self-pay | Admitting: *Deleted

## 2012-10-27 MED ORDER — OMEPRAZOLE 40 MG PO CPDR: 40.0000 mg | DELAYED_RELEASE_CAPSULE | Freq: Every day | ORAL | Status: DC

## 2012-12-04 ENCOUNTER — Other Ambulatory Visit: Payer: Self-pay | Admitting: Orthopedic Surgery

## 2012-12-13 ENCOUNTER — Ambulatory Visit
Admission: RE | Admit: 2012-12-13 | Discharge: 2012-12-13 | Disposition: A | Discharge status: Home / Self Care | Payer: 59 | Source: Ambulatory Visit | Attending: Orthopedic Surgery | Admitting: Orthopedic Surgery

## 2012-12-13 MED ORDER — GADOBENATE DIMEGLUMINE 529 MG/ML IV SOLN
20.0000 mL | Freq: Once | INTRAVENOUS | Status: AC | PRN
  Administered 2012-12-13: 20 mL via INTRAVENOUS

## 2012-12-25 ENCOUNTER — Other Ambulatory Visit: Payer: Self-pay | Admitting: Oncology

## 2012-12-25 DIAGNOSIS — C50419 Malignant neoplasm of upper-outer quadrant of unspecified female breast: Secondary | ICD-10-CM

## 2012-12-27 ENCOUNTER — Other Ambulatory Visit: Payer: Self-pay | Admitting: Orthopedic Surgery

## 2013-01-18 ENCOUNTER — Other Ambulatory Visit: Payer: Self-pay | Admitting: Internal Medicine

## 2013-01-18 DIAGNOSIS — R319 Hematuria, unspecified: Secondary | ICD-10-CM

## 2013-01-19 ENCOUNTER — Ambulatory Visit
Admission: RE | Admit: 2013-01-19 | Discharge: 2013-01-19 | Disposition: A | Discharge status: Home / Self Care | Payer: 59 | Source: Ambulatory Visit | Attending: Internal Medicine | Admitting: Internal Medicine

## 2013-01-19 DIAGNOSIS — R319 Hematuria, unspecified: Secondary | ICD-10-CM

## 2013-02-01 ENCOUNTER — Other Ambulatory Visit: Payer: Self-pay

## 2013-02-05 ENCOUNTER — Ambulatory Visit (HOSPITAL_BASED_OUTPATIENT_CLINIC_OR_DEPARTMENT_OTHER): Admission: RE | Admit: 2013-02-05 | Payer: 59 | Source: Ambulatory Visit | Admitting: Orthopedic Surgery

## 2013-02-05 ENCOUNTER — Encounter (HOSPITAL_BASED_OUTPATIENT_CLINIC_OR_DEPARTMENT_OTHER): Admission: RE | Payer: Self-pay | Source: Ambulatory Visit

## 2013-02-05 SURGERY — EXCISION MASS
Anesthesia: Choice | Site: Hand | Laterality: Right

## 2013-03-21 ENCOUNTER — Other Ambulatory Visit: Payer: Self-pay | Admitting: Oncology

## 2013-03-21 DIAGNOSIS — C50919 Malignant neoplasm of unspecified site of unspecified female breast: Secondary | ICD-10-CM

## 2013-04-13 ENCOUNTER — Other Ambulatory Visit: Payer: Self-pay | Admitting: *Deleted

## 2013-04-13 DIAGNOSIS — C50419 Malignant neoplasm of upper-outer quadrant of unspecified female breast: Secondary | ICD-10-CM

## 2013-04-16 ENCOUNTER — Other Ambulatory Visit (HOSPITAL_BASED_OUTPATIENT_CLINIC_OR_DEPARTMENT_OTHER): Payer: 59

## 2013-04-16 ENCOUNTER — Ambulatory Visit (HOSPITAL_COMMUNITY)
Admission: RE | Admit: 2013-04-16 | Discharge: 2013-04-16 | Disposition: A | Discharge status: Home / Self Care | Payer: 59 | Source: Ambulatory Visit | Attending: Oncology | Admitting: Oncology

## 2013-04-16 ENCOUNTER — Other Ambulatory Visit: Payer: Self-pay | Admitting: Physician Assistant

## 2013-04-16 DIAGNOSIS — C50919 Malignant neoplasm of unspecified site of unspecified female breast: Secondary | ICD-10-CM | POA: Insufficient documentation

## 2013-04-16 DIAGNOSIS — C50419 Malignant neoplasm of upper-outer quadrant of unspecified female breast: Secondary | ICD-10-CM

## 2013-04-16 LAB — COMPREHENSIVE METABOLIC PANEL (CC13)
ALBUMIN: 4 g/dL (ref 3.5–5.0)
ALK PHOS: 48 U/L (ref 40–150)
ALT: 17 U/L (ref 0–55)
AST: 25 U/L (ref 5–34)
Anion Gap: 9 mEq/L (ref 3–11)
BILIRUBIN TOTAL: 0.55 mg/dL (ref 0.20–1.20)
BUN: 15 mg/dL (ref 7.0–26.0)
CO2: 28 mEq/L (ref 22–29)
Calcium: 9.6 mg/dL (ref 8.4–10.4)
Chloride: 104 mEq/L (ref 98–109)
Creatinine: 0.8 mg/dL (ref 0.6–1.1)
GLUCOSE: 114 mg/dL (ref 70–140)
POTASSIUM: 4.2 meq/L (ref 3.5–5.1)
SODIUM: 141 meq/L (ref 136–145)
TOTAL PROTEIN: 7.1 g/dL (ref 6.4–8.3)

## 2013-04-16 LAB — CBC WITH DIFFERENTIAL/PLATELET
BASO%: 0.7 % (ref 0.0–2.0)
Basophils Absolute: 0 10*3/uL (ref 0.0–0.1)
EOS%: 3.6 % (ref 0.0–7.0)
Eosinophils Absolute: 0.1 10*3/uL (ref 0.0–0.5)
HCT: 38.5 % (ref 34.8–46.6)
HGB: 12.9 g/dL (ref 11.6–15.9)
LYMPH%: 28.7 % (ref 14.0–49.7)
MCH: 31.3 pg (ref 25.1–34.0)
MCHC: 33.6 g/dL (ref 31.5–36.0)
MCV: 93.1 fL (ref 79.5–101.0)
MONO#: 0.3 10*3/uL (ref 0.1–0.9)
MONO%: 10.3 % (ref 0.0–14.0)
NEUT%: 56.7 % (ref 38.4–76.8)
NEUTROS ABS: 1.9 10*3/uL (ref 1.5–6.5)
PLATELETS: 256 10*3/uL (ref 145–400)
RBC: 4.13 10*6/uL (ref 3.70–5.45)
RDW: 12.5 % (ref 11.2–14.5)
WBC: 3.3 10*3/uL — ABNORMAL LOW (ref 3.9–10.3)
lymph#: 0.9 10*3/uL (ref 0.9–3.3)

## 2013-04-23 ENCOUNTER — Ambulatory Visit (HOSPITAL_BASED_OUTPATIENT_CLINIC_OR_DEPARTMENT_OTHER): Payer: 59 | Admitting: Physician Assistant

## 2013-04-23 ENCOUNTER — Encounter: Payer: Self-pay | Admitting: Physician Assistant

## 2013-04-23 VITALS — BP 129/84 | HR 66 | Temp 98.0°F | Resp 18 | Ht 71.0 in | Wt 224.7 lb

## 2013-04-23 DIAGNOSIS — Z853 Personal history of malignant neoplasm of breast: Secondary | ICD-10-CM

## 2013-04-23 DIAGNOSIS — N941 Unspecified dyspareunia: Secondary | ICD-10-CM

## 2013-04-23 DIAGNOSIS — IMO0002 Reserved for concepts with insufficient information to code with codable children: Secondary | ICD-10-CM

## 2013-04-23 DIAGNOSIS — M6289 Other specified disorders of muscle: Secondary | ICD-10-CM | POA: Insufficient documentation

## 2013-04-23 DIAGNOSIS — C50419 Malignant neoplasm of upper-outer quadrant of unspecified female breast: Secondary | ICD-10-CM

## 2013-04-23 DIAGNOSIS — N8184 Pelvic muscle wasting: Secondary | ICD-10-CM

## 2013-04-23 DIAGNOSIS — R911 Solitary pulmonary nodule: Secondary | ICD-10-CM | POA: Insufficient documentation

## 2013-04-23 NOTE — Progress Notes (Signed)
ID: Jennifer Walker   DOB: April 11, 1955  MR#: [id number]  WNI#:[id number]  PCP: Shon Baton MD GYN: Gus Height MD SU: Alphonsa Overall MD [place]: Thea Silversmith, MD OTHER:  Zenovia Jarred, MD  CHIEF COMPLAINT:  Hx of Left Breast Cancer   HISTORY OF PRESENT ILLNESS: "Jennifer Walker" had routine screening mammography at [facility] 09/22/2011 showing a possible mass in the left breast. Diagnostic left mammography and ultrasonography at the [facility] 10/05/2011 confirmed an irregular mass in the left upper outer quadrant which was not palpable. Ultrasound showed this to be irregular, solid, and to measure 1.2 cm. The left axilla was unremarkable.  Biopsy of the left breast mass on the same day showed ([id number]) an invasive ductal carcinoma, grade 1, estrogen receptor 100% and progesterone receptor 39% positive, with no HER-2 amplification, and an MIB-1 of 1.32. Jennifer Walker proceeded to bilateral breast MRI 10/08/2011. This confirmed a 1.9 cm enhancing mass in the upper outer portion of the left breast, and found a second, 7 mm mass in the more anterior aspect of the same breast. This second mass was identified by ultrasonography 10/12/2011 and biopsy showed ([id number]) an intraductal papilloma.  Accordingly, on 11/01/2011 the patient underwent double lumpectomy (left upper and left inferior breast) with left axillary sentinel lymph node biopsy under Alphonsa Overall. The final pathology ([id number]) showed the left inferior lumpectomy to consist of a fibroadenoma. The left upper lumpectomy showed a 1.8 cm invasive ductal carcinoma, grade 1, with negative margins (closest distance 8 mm) and a negative sentinel lymph node.   The patient's subsequent history is as detailed below.  INTERVAL HISTORY: Jennifer Walker returns alone today for followup of her left breast cancer. She tells me the last 6 months have been generally unremarkable. Her family is doing well, including her sons who are now 17 and 51. She continues on  the anastrozole with good tolerance.  She has no increased joint pain or increased hot flashes.   She does have some vaginal dryness and dyspareunia which have worsened with both menopause and the initiation of anastrozole. She also can "feel her cervix dropping" at times, and her description is consistent with uterine prolapse. She denies any general pain, and has had no abnormal bleeding.   Of course as noted previously, a CT scan in July of 2014 showed an 8 mm lingular opacity felt to be benign. A chest x-ray was obtained last week and was unremarkable.  REVIEW OF SYSTEMS: Jennifer Walker denies any recent illnesses and has had no fevers or chills. She continues to have insomnia which is a chronic complaint. Her energy level is good. She is exercising on a regular basis. She continues to have reflux although it has improved with daily Prilosec. She denies any nausea or emesis and has noted no change in bowel or bladder habits. She's had no cough, shortness of breath, chest pain, or palpitations. She denies any abnormal headaches or dizziness and has had no change in vision. She also denies any increased myalgias, arthralgias, bony pain, or peripheral swelling.  A detailed review of systems is otherwise stable and noncontributory.  PAST MEDICAL HISTORY: Past Medical History  Diagnosis Date  . Insomnia   . H/O colonoscopy 2003  . Wears glasses   . Fatigue   . Psoriasis   . Hot flashes   . PONV (postoperative nausea and vomiting)   . Breast cancer     Invasive ductal ca grade 1 er/pr positive, her2  negative    PAST SURGICAL  HISTORY: Past Surgical History  Procedure Laterality Date  . Knee surgery  2005, 2007    bilateral   . Dilation and curettage of uterus    . Tumor removal  1975    right breast  . Lymph gland excision  1978    right axillary  . Mandible surgery  1982  . Breast surgery  10/1976    right breast benign tumor  . Breast lumpectomy  11/01/2011    left breast lumpectomy     FAMILY HISTORY Family History  Problem Relation Age of Onset  . Breast cancer Mother 63    Inflammatory breast cancer  . Cancer Mother     breast  . Heart attack Father   . Heart disease Father   . Cancer Cousin     breast  . Colon cancer Neg Hx   . Colon polyps Neg Hx   . Stomach cancer Neg Hx   . Rectal cancer Neg Hx    the patient's father died at the age of 56 from a myocardial infarction, in the setting of a motorcycle accident. The patient's mother died from inflammatory breast cancer at the age of 80. It had been diagnosed at age 45. The patient has one brother and one sister. There is no other breast or ovarian cancer or other cancer in the immediate family.  GYNECOLOGIC HISTORY:   Menarche age 65, menopause 2009. She did not take hormone replacement. First live birth was age 30. She is GX P2.  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 Her husband Herbie Baltimore works as a Government social research officer for [employer]. Their children are Patsey Berthold Junior ("Wes"), 17, and Aggie Moats [place]"), 15. The patient attends a local [employer] DIRECTIVES: Not in place  HEALTH MAINTENANCE:  (Obtained January 2015) History  Substance Use Topics  . Smoking status: Never Smoker   . Smokeless tobacco: Never Used  . Alcohol Use: 4.2 oz/week    7 Cans of beer per week     Comment: 1 beer per night     Colonoscopy: May 2014/[facility]  PAP: July 2014/[facility]  Bone density: Oct 2013, Normal  Lipid panel: Not on file/[place]   Allergies  Allergen Reactions  . Sulfa Antibiotics Other (See Comments)    Yeast infections and ulcers  . Sulfa Drugs Cross Reactors   . Tape Rash    Current Outpatient Prescriptions  Medication Sig Dispense Refill  . anastrozole (ARIMIDEX) 1 MG tablet TAKE 1 TABLET DAILY  90 tablet  0  . calcipotriene-betamethasone (TACLONEX) ointment Apply 1 application topically daily as needed. For psoriasis       . clobetasol (TEMOVATE) 0.05 % external solution Apply 1 application topically daily as needed. For psoriasis      . Glucosamine-Chondroit-Vit C-Mn (GLUCOSAMINE 1500 COMPLEX PO) Take 2 tablets by mouth daily.      . Multiple Vitamins-Minerals (MULTIVITAMINS THER. W/MINERALS) TABS Take 1 tablet by mouth daily.      . Nutritional Supplements (MELATONIN PO) Take 1-2 tablets by mouth at bedtime as needed. For sleep      . omeprazole (PRILOSEC) 40 MG capsule Take 1 capsule (40 mg total) by mouth daily.  30 capsule  6  . nitrofurantoin, macrocrystal-monohydrate, (MACROBID) 100 MG capsule Take 100 mg by mouth daily as needed. Takes when pt has sex      . zolpidem (AMBIEN) 10 MG tablet Take 10 mg by mouth at bedtime  as needed. For sleep       No current facility-administered medications for this visit.    OBJECTIVE: Middle-aged white woman in no acute distress Filed Vitals:   04/23/13 1128  BP: 129/84  Pulse: 66  Temp: 98 F (36.7 C)  Resp: 18     Body mass index is 31.35 kg/(m^2).    ECOG FS: 0 Filed Weights   04/23/13 1128  Weight: 224 lb 11.2 oz (101.923 kg)   Physical Exam: HEENT:  Sclerae anicteric.  Oropharynx clear and moist. Neck supple. Trachea midline. The thyromegaly palpated. NODES:  No cervical or supraclavicular lymphadenopathy palpated.  BREAST EXAM:  Right breast is unremarkable. Left breast is status post lumpectomy and radiation, with no suspicious nodularities or skin changes, and no evidence of local recurrence. Axillae are benign bilaterally, no palpable lymphadenopathy. LUNGS:  Clear to auscultation bilaterally with good excursion.  No wheezes or rhonchi HEART:  Regular rate and rhythm. No murmur  ABDOMEN:  Soft, nontender. No organomegaly palpated.  Positive bowel sounds.  MSK:  No focal spinal tenderness to palpation. 4 range of motion bilaterally in the upper extremities EXTREMITIES:  No peripheral edema.   SKIN:  There is a small scaly patch of psoriasis noted  on the left abdomen. Otherwise, benign with no visible rashes or skin lesions. No nail dyscrasia. No pallor. NEURO:  Nonfocal. Well oriented.  Appropriate affect.    LAB RESULTS: Lab Results  Component Value Date   WBC 3.3* 04/16/2013   NEUTROABS 1.9 04/16/2013   HGB 12.9 04/16/2013   HCT 38.5 04/16/2013   MCV 93.1 04/16/2013   PLT 256 04/16/2013      Chemistry      Component Value Date/Time   NA 141 04/16/2013 1043   NA 139 10/13/2011 0816   K 4.2 04/16/2013 1043   K 4.2 10/13/2011 0816   CL 102 05/02/2012 1028   CL 101 10/13/2011 0816   CO2 28 04/16/2013 1043   CO2 30 10/13/2011 0816   BUN 15.0 04/16/2013 1043   BUN 20 10/13/2011 0816   CREATININE 0.8 04/16/2013 1043   CREATININE 0.89 10/13/2011 0816      Component Value Date/Time   CALCIUM 9.6 04/16/2013 1043   CALCIUM 9.6 10/13/2011 0816   ALKPHOS 48 04/16/2013 1043   ALKPHOS 45 10/13/2011 0816   AST 25 04/16/2013 1043   AST 18 10/13/2011 0816   ALT 17 04/16/2013 1043   ALT 12 10/13/2011 0816   BILITOT 0.55 04/16/2013 1043   BILITOT 0.4 10/13/2011 0816       Lab Results  Component Value Date   LABCA2 5 11/23/2011     STUDIES:  Most recent bone density on 10-13 was normal.  Most recent bilateral mammogram on 09/25/2012 was unremarkable.  Ct Abdomen Pelvis W Contrast 10/04/2012   *RADIOLOGY REPORT*  Clinical Data: Endogastric pain, gas/bloating, nausea, constipation.  CT ABDOMEN AND PELVIS WITH CONTRAST  Technique:  Multidetector CT imaging of the abdomen and pelvis was performed following the standard protocol during bolus administration of intravenous contrast.  Contrast: 164m OMNIPAQUE IOHEXOL 300 MG/ML  SOLN  Comparison: None.  Findings: 8 x 4 mm patchy/nodular opacity in the lingula (series 4/image 3).  Liver, spleen, pancreas, and adrenal glands are within normal limits.  Gallbladder is unremarkable.  No intrahepatic or extrahepatic ductal dilatation.  Kidneys are within normal limits.  No hydronephrosis.  No evidence of bowel  obstruction.  Normal appendix.  Moderate colonic stool burden.  No evidence of  abdominal aortic aneurysm.  No abdominopelvic ascites.  No suspicious abdominopelvic lymphadenopathy.  Uterus is unremarkable.  No adnexal masses.  Bladder is within normal limits.  Moderate degenerative changes at L4-5.  IMPRESSION: No evidence of bowel obstruction.  Normal appendix.  Moderate colonic stool burden, suggesting constipation.  8 x 4 mm patchy/nodular opacity in the lingula.  If high risk for primary bronchogenic neoplasm, initial follow-up CT chest is suggested in 6-12 months.  If low risk, a single follow-up CT chest is suggested in 12 months.  This recommendation follows the consensus statement: Guidelines for Management of Small Pulmonary Nodules Detected on CT Scans: A Statement from the [employer] as published in Radiology 2005; 237:395-400.   Original Report Authenticated By: Julian Hy, M.D.   Dg Chest 2 View 04/16/2013   CLINICAL DATA:  Breast cancer.  Follow-up lingular density.  EXAM: CHEST  2 VIEW  COMPARISON:  Chest CT 10/04/2012  FINDINGS: The Cardiac and mediastinal contours normal are normal. Negative for heart failure. Lungs are clear without infiltrate effusion or mass.  Small lingular density on prior CT is not identified however would be difficult to see on a chest x-ray.  IMPRESSION: No active cardiopulmonary disease.   Electronically Signed   By: Franchot Gallo M.D.   On: 04/16/2013 10:46     ASSESSMENT: 58 y.o. [place] woman   (1)  status post left lumpectomy and sentinel lymph node sampling 11/01/2011 for a pT1c pN0, stage IA invasive ductal carcinoma, grade 1, estrogen receptor 100% and progesterone receptor 39% positive, with no HER-2 amplification, and an MIB-1 of 8%.  (2) the Oncotype DX recurrent score is 13, predicting a distant recurrence rate of 8% in women like her who take tamoxifen for 5 years.  (3) adjuvant radiation completed 01/27/2012  (4) anastrozole  started November 2013  (4) incidentally noted 67m lingular opacity on CT scan July 2014. Chest x-ray unremarkable in January 2015. A repeat chest CT was recommended for July 2015 to assess stability.   PLAN:  With regards to her breast cancer, LVaughan Bastaseems to be doing well, with no clinical evidence of disease recurrence. She'll continue on the anastrozole, our goal being to continue for a total of 5 years (until November 2018).  We will follow her bone density for any changes, the next being due in October 2015.  We will see her again in 6 months for routine followup including labs and physical exam. Prior to that visit she will have her repeat mammogram in early July, as well as a repeat chest CT to evaluate the abnormality discussed above.  She'll also continue to be followed by Dr. RHarrington Challenger and I advised that she see him with regards to possible uterine prolapse. I am also placing a referral to our cancer rehabilitation physical therapists for further evaluation of pelvic dysfunction and dyspareunia.  All this was reviewed in detail with LVaughan Bastatoday, and she voices her understanding and agreement with the above. She will call with any changes prior to her next appointment.  Laniqua Torrens PA-C     04/23/2013

## 2013-05-10 ENCOUNTER — Ambulatory Visit: Payer: 59 | Attending: Physician Assistant | Admitting: Physical Therapy

## 2013-05-10 DIAGNOSIS — M2420 Disorder of ligament, unspecified site: Secondary | ICD-10-CM | POA: Insufficient documentation

## 2013-05-10 DIAGNOSIS — IMO0001 Reserved for inherently not codable concepts without codable children: Secondary | ICD-10-CM | POA: Insufficient documentation

## 2013-05-10 DIAGNOSIS — M629 Disorder of muscle, unspecified: Secondary | ICD-10-CM | POA: Insufficient documentation

## 2013-05-17 ENCOUNTER — Ambulatory Visit: Payer: 59 | Admitting: Physical Therapy

## 2013-05-24 ENCOUNTER — Ambulatory Visit: Payer: 59 | Admitting: Physical Therapy

## 2013-05-31 ENCOUNTER — Ambulatory Visit: Payer: 59 | Admitting: Physical Therapy

## 2013-08-03 IMAGING — CT CT ABD-PELV W/ CM
3 of 5 series · 13 of 36 positions shown, 19 images · IV contrast ([ID] OMNI [id number])
Comparison: None.

CLINICAL DATA: Endogastric pain, gas/bloating, nausea,
constipation.

CT ABDOMEN AND PELVIS WITH CONTRAST
TECHNIQUE: Multidetector CT imaging of the abdomen and pelvis was
performed following the standard protocol during bolus
administration of intravenous contrast.
Contrast: 125mL OMNIPAQUE IOHEXOL 300 MG/ML  SOLN

[Series 3: abd/pelvis with · axial · 0.78mm/px · z∈[-343,-18]mm · 7 of 87 slices shown, 12 images]
[im 11/87  soft-tissue]
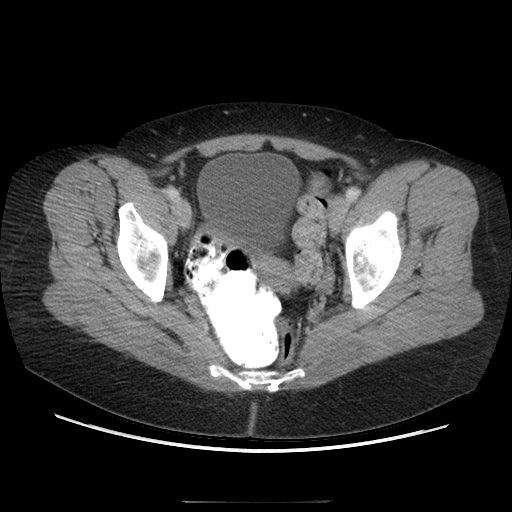
[im 11/87  bone]
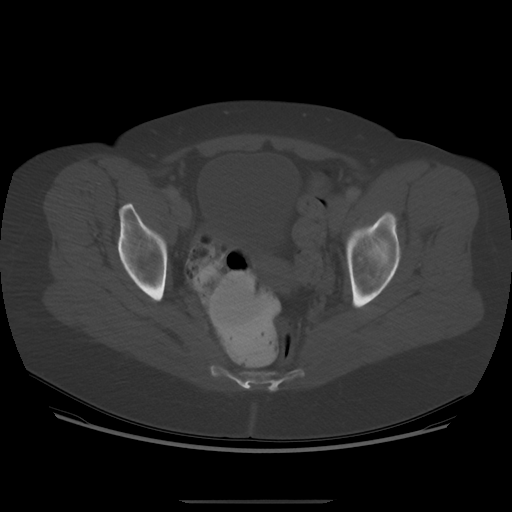
[im 22/87  soft-tissue]
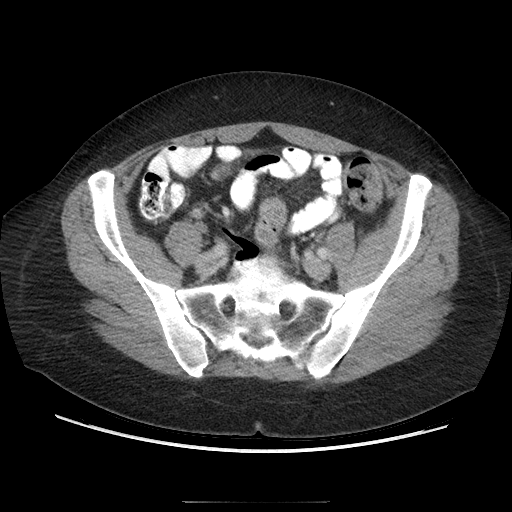
[im 33/87  soft-tissue]
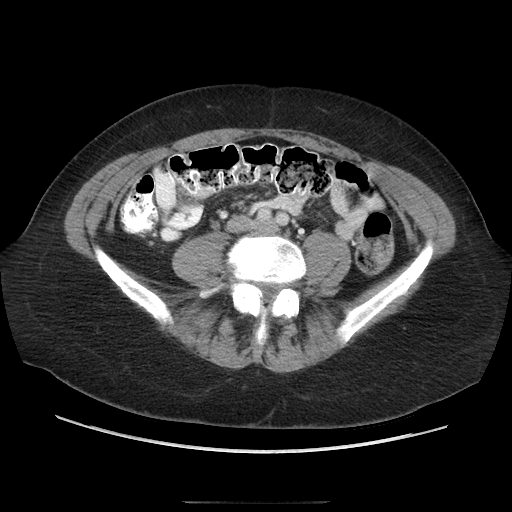
[im 44/87  soft-tissue]
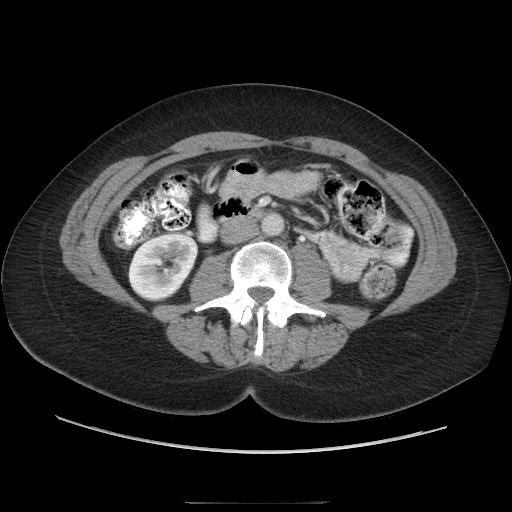
[im 44/87  lung]
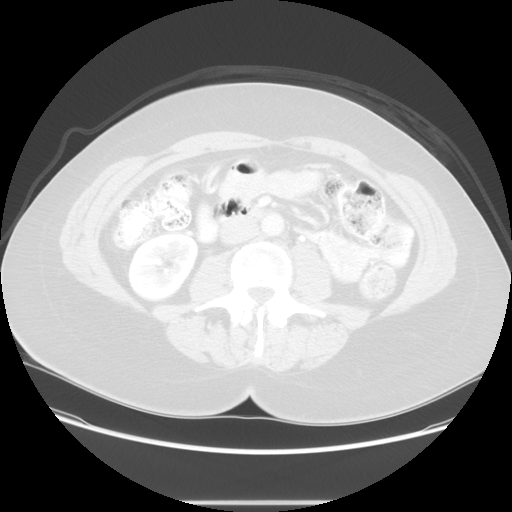
[im 54/87  soft-tissue]
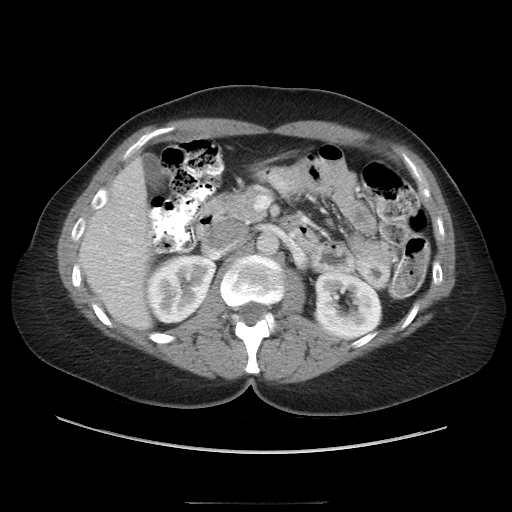
[im 54/87  lung]
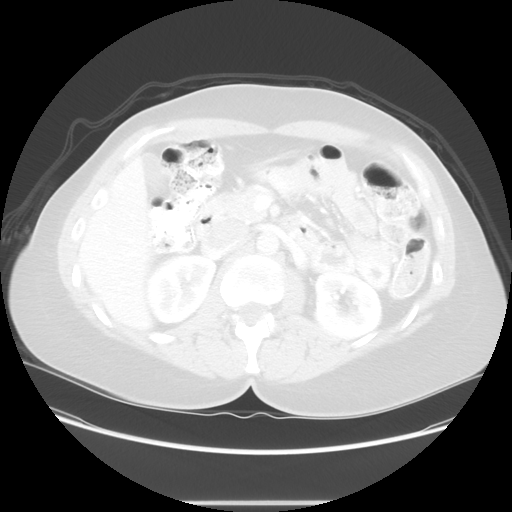
[im 65/87  soft-tissue]
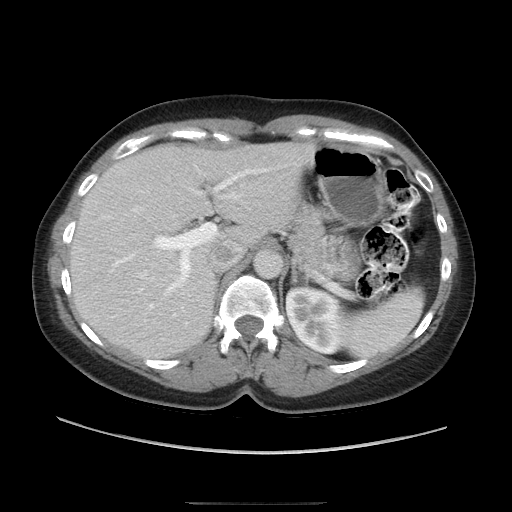
[im 65/87  lung]
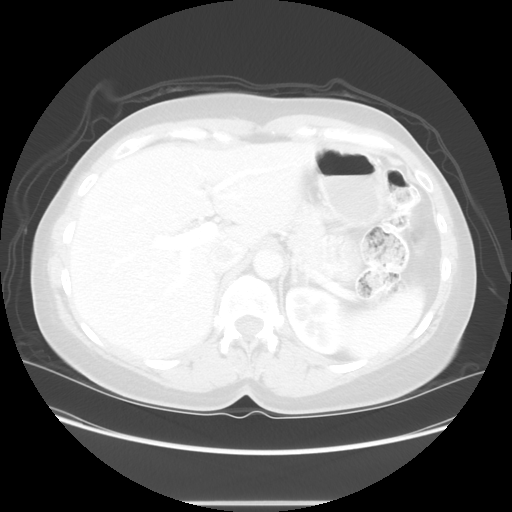
[im 76/87  soft-tissue]
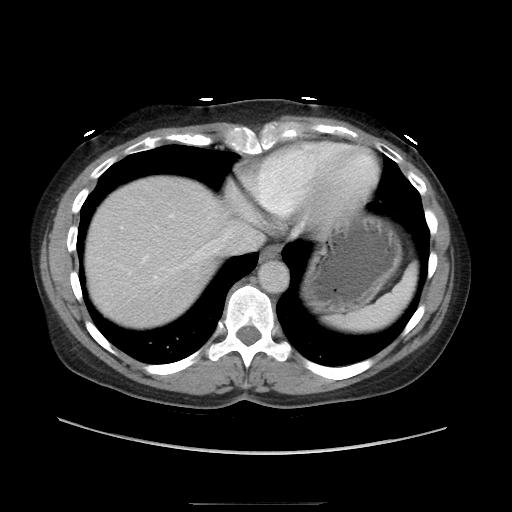
[im 76/87  lung]
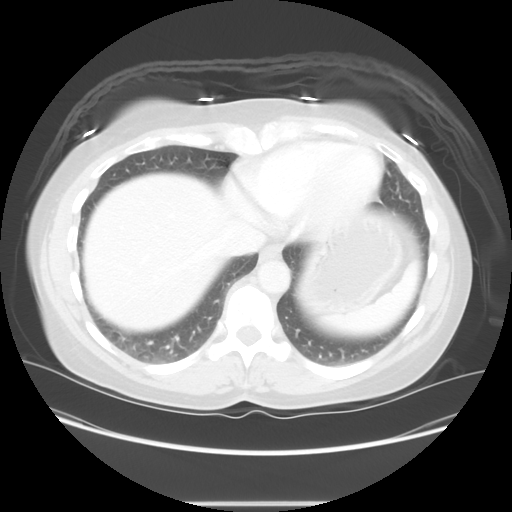

[Series 601: coronal body · coronal · 0.97mm/px · 1 of 119 slices shown, 2 images]
[im 40/119  soft-tissue]
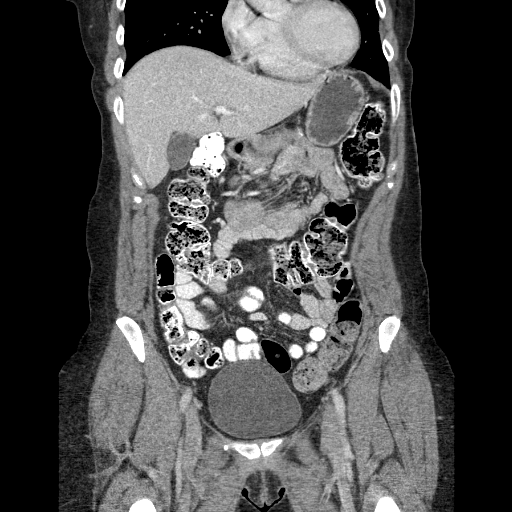
[im 40/119  bone]
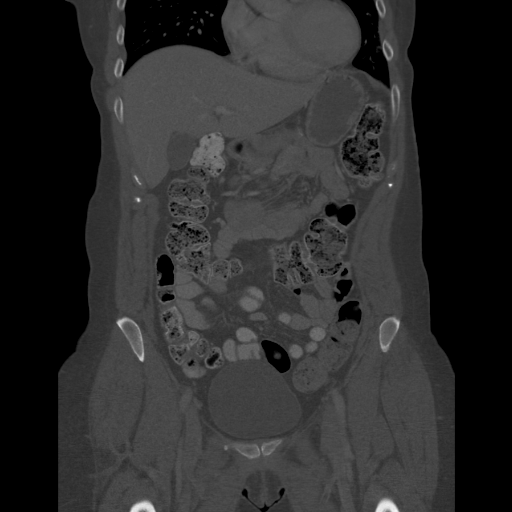

[Series 602: sagittal body · sagittal · 0.97mm/px · 5 of 161 slices shown]
[im 11/161  soft-tissue]
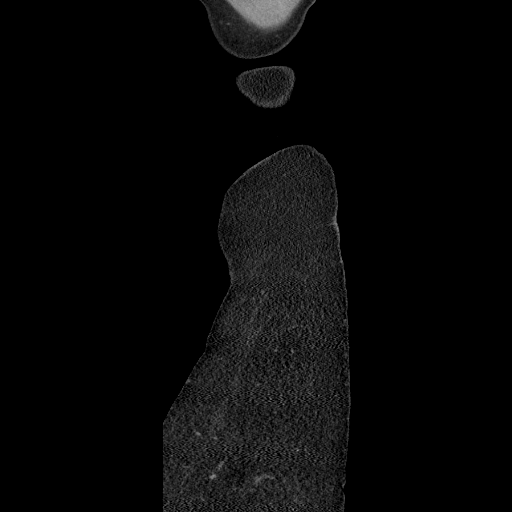
[im 33/161  soft-tissue]
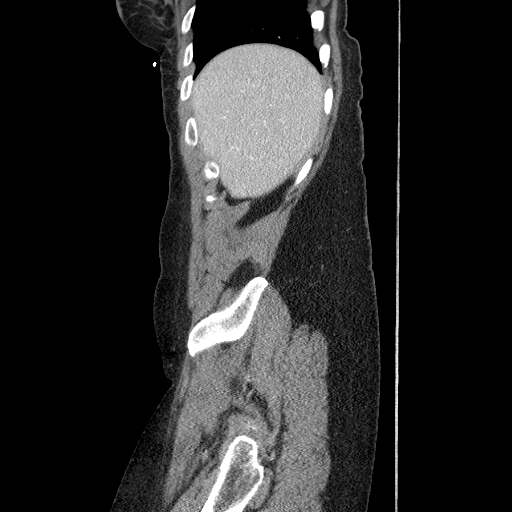
[im 54/161  soft-tissue]
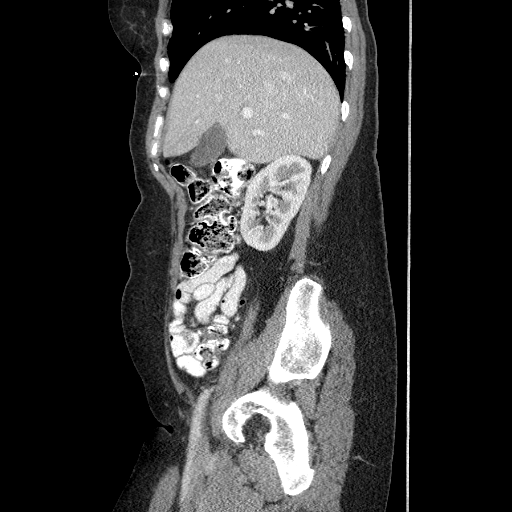
[im 75/161  soft-tissue]
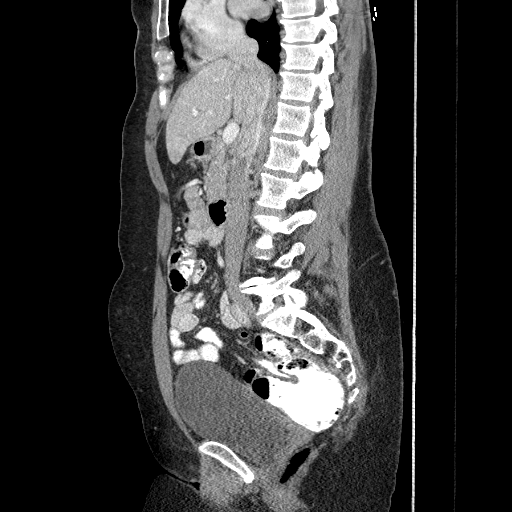
[im 86/161  soft-tissue]
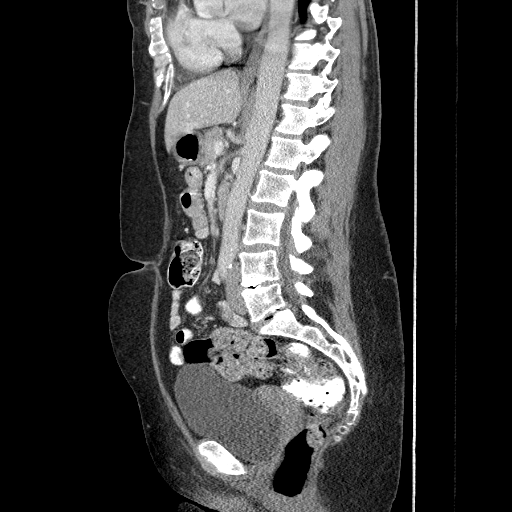

[13 of 36 positions shown; findings below may reference images not displayed]

FINDINGS: 8 x 4 mm patchy/nodular opacity in the lingula (series
4/image 3).

Liver, spleen, pancreas, and adrenal glands are within normal
limits.

Gallbladder is unremarkable.  No intrahepatic or extrahepatic
ductal dilatation.

Kidneys are within normal limits.  No hydronephrosis.

No evidence of bowel obstruction.  Normal appendix.  Moderate
colonic stool burden.

No evidence of abdominal aortic aneurysm.

No abdominopelvic ascites.

No suspicious abdominopelvic lymphadenopathy.

Uterus is unremarkable.  No adnexal masses.

Bladder is within normal limits.

Moderate degenerative changes at L4-5.
IMPRESSION: No evidence of bowel obstruction.  Normal appendix.

Moderate colonic stool burden, suggesting constipation.

8 x 4 mm patchy/nodular opacity in the lingula.  If high risk for
primary bronchogenic neoplasm, initial follow-up CT chest is
suggested in 6-12 months.  If low risk, a single follow-up CT chest
is suggested in 12 months.

This recommendation follows the consensus statement: Guidelines for
Management of Small Pulmonary Nodules Detected on CT Scans: A
Statement from the [HOSPITAL] as published in Radiology

## 2013-08-06 ENCOUNTER — Telehealth: Payer: Self-pay | Admitting: *Deleted

## 2013-08-06 ENCOUNTER — Other Ambulatory Visit: Payer: Self-pay | Admitting: *Deleted

## 2013-08-06 DIAGNOSIS — C50919 Malignant neoplasm of unspecified site of unspecified female breast: Secondary | ICD-10-CM

## 2013-08-06 MED ORDER — ANASTROZOLE 1 MG PO TABS: 1.0000 mg | ORAL_TABLET | Freq: Every day | ORAL | Status: DC

## 2013-08-06 MED ORDER — ANASTROZOLE 1 MG PO TABS
1.0000 mg | ORAL_TABLET | Freq: Every day | ORAL | Status: DC
Start: 2013-08-06 — End: 2014-07-29

## 2013-08-06 NOTE — Telephone Encounter (Signed)
Pt called to obtain a refill on arimidex with concern due to [facility] stating they have not received a return fax per her call last week to them.  Pt is now down to 1 pill.  This RN discussed above - with plan to send refill to [facility] as well send short dispense amount to local pharmacy for medication compliance.  Pt verbalized understanding.

## 2013-08-10 ENCOUNTER — Telehealth: Payer: Self-pay | Admitting: Oncology

## 2013-08-10 NOTE — Telephone Encounter (Signed)
lmonvm for pt re appts for 7/1 and 7/9. central will call w/ct. scheduile mailed.

## 2013-08-28 ENCOUNTER — Telehealth: Payer: Self-pay | Admitting: *Deleted

## 2013-08-28 NOTE — Telephone Encounter (Signed)
Received voice message from patient stating, "I'm getting a MRI of the chest, do I still have to get a mammogram?" I informed patient about the differences of MRI and a mammogram. Patient verbalized understanding of getting a mammogram and MRI.

## 2013-09-18 ENCOUNTER — Other Ambulatory Visit: Payer: Self-pay | Admitting: Physician Assistant

## 2013-09-18 DIAGNOSIS — C50419 Malignant neoplasm of upper-outer quadrant of unspecified female breast: Secondary | ICD-10-CM

## 2013-09-26 ENCOUNTER — Ambulatory Visit (HOSPITAL_COMMUNITY)
Admission: RE | Admit: 2013-09-26 | Discharge: 2013-09-26 | Disposition: A | Discharge status: Home / Self Care | Payer: 59 | Source: Ambulatory Visit | Attending: Physician Assistant | Admitting: Physician Assistant

## 2013-09-26 ENCOUNTER — Encounter (HOSPITAL_COMMUNITY): Payer: Self-pay

## 2013-09-26 ENCOUNTER — Other Ambulatory Visit (HOSPITAL_BASED_OUTPATIENT_CLINIC_OR_DEPARTMENT_OTHER): Payer: 59

## 2013-09-26 DIAGNOSIS — Z923 Personal history of irradiation: Secondary | ICD-10-CM | POA: Insufficient documentation

## 2013-09-26 DIAGNOSIS — Z9010 Acquired absence of unspecified breast and nipple: Secondary | ICD-10-CM | POA: Insufficient documentation

## 2013-09-26 DIAGNOSIS — C50419 Malignant neoplasm of upper-outer quadrant of unspecified female breast: Secondary | ICD-10-CM

## 2013-09-26 DIAGNOSIS — Z853 Personal history of malignant neoplasm of breast: Secondary | ICD-10-CM

## 2013-09-26 DIAGNOSIS — C50919 Malignant neoplasm of unspecified site of unspecified female breast: Secondary | ICD-10-CM | POA: Insufficient documentation

## 2013-09-26 DIAGNOSIS — R911 Solitary pulmonary nodule: Secondary | ICD-10-CM

## 2013-09-26 LAB — CBC WITH DIFFERENTIAL/PLATELET
BASO%: 0.9 % (ref 0.0–2.0)
Basophils Absolute: 0 10*3/uL (ref 0.0–0.1)
EOS%: 3.9 % (ref 0.0–7.0)
Eosinophils Absolute: 0.2 10*3/uL (ref 0.0–0.5)
HEMATOCRIT: 38.1 % (ref 34.8–46.6)
HGB: 12.7 g/dL (ref 11.6–15.9)
LYMPH%: 23.7 % (ref 14.0–49.7)
MCH: 31.2 pg (ref 25.1–34.0)
MCHC: 33.3 g/dL (ref 31.5–36.0)
MCV: 93.8 fL (ref 79.5–101.0)
MONO#: 0.4 10*3/uL (ref 0.1–0.9)
MONO%: 9.8 % (ref 0.0–14.0)
NEUT#: 2.6 10*3/uL (ref 1.5–6.5)
NEUT%: 61.7 % (ref 38.4–76.8)
PLATELETS: 244 10*3/uL (ref 145–400)
RBC: 4.06 10*6/uL (ref 3.70–5.45)
RDW: 12.5 % (ref 11.2–14.5)
WBC: 4.1 10*3/uL (ref 3.9–10.3)
lymph#: 1 10*3/uL (ref 0.9–3.3)

## 2013-09-26 LAB — COMPREHENSIVE METABOLIC PANEL (CC13)
ALK PHOS: 47 U/L (ref 40–150)
ALT: 15 U/L (ref 0–55)
ANION GAP: 7 meq/L (ref 3–11)
AST: 19 U/L (ref 5–34)
Albumin: 3.7 g/dL (ref 3.5–5.0)
BILIRUBIN TOTAL: 0.24 mg/dL (ref 0.20–1.20)
BUN: 18.1 mg/dL (ref 7.0–26.0)
CO2: 28 meq/L (ref 22–29)
CREATININE: 0.9 mg/dL (ref 0.6–1.1)
Calcium: 9 mg/dL (ref 8.4–10.4)
Chloride: 107 mEq/L (ref 98–109)
GLUCOSE: 106 mg/dL (ref 70–140)
Potassium: 4.4 mEq/L (ref 3.5–5.1)
Sodium: 142 mEq/L (ref 136–145)
Total Protein: 6.5 g/dL (ref 6.4–8.3)

## 2013-09-26 MED ORDER — IOHEXOL 300 MG/ML  SOLN
80.0000 mL | Freq: Once | INTRAMUSCULAR | Status: AC | PRN
  Administered 2013-09-26: 80 mL via INTRAVENOUS

## 2013-10-02 ENCOUNTER — Telehealth: Payer: Self-pay | Admitting: *Deleted

## 2013-10-02 NOTE — Telephone Encounter (Signed)
Spoke with patient and rescheduled her appointment per GM to 10/04/13 with Dr. Lona Kettle at [facility].

## 2013-10-04 ENCOUNTER — Ambulatory Visit (HOSPITAL_BASED_OUTPATIENT_CLINIC_OR_DEPARTMENT_OTHER): Payer: 59 | Admitting: Hematology

## 2013-10-04 ENCOUNTER — Encounter: Payer: Self-pay | Admitting: Hematology

## 2013-10-04 ENCOUNTER — Other Ambulatory Visit: Payer: Self-pay | Admitting: *Deleted

## 2013-10-04 VITALS — BP 113/75 | HR 71 | Temp 98.2°F | Resp 18 | Ht 71.0 in | Wt 215.2 lb

## 2013-10-04 DIAGNOSIS — F5102 Adjustment insomnia: Secondary | ICD-10-CM

## 2013-10-04 DIAGNOSIS — G4700 Insomnia, unspecified: Secondary | ICD-10-CM

## 2013-10-04 DIAGNOSIS — Z170 Estrogen receptor positive status [ER+]: Secondary | ICD-10-CM

## 2013-10-04 DIAGNOSIS — C50412 Malignant neoplasm of upper-outer quadrant of left female breast: Secondary | ICD-10-CM | POA: Insufficient documentation

## 2013-10-04 DIAGNOSIS — K219 Gastro-esophageal reflux disease without esophagitis: Secondary | ICD-10-CM

## 2013-10-04 DIAGNOSIS — C50419 Malignant neoplasm of upper-outer quadrant of unspecified female breast: Secondary | ICD-10-CM

## 2013-10-04 MED ORDER — LORAZEPAM 0.5 MG PO TABS: 0.5000 mg | ORAL_TABLET | Freq: Three times a day (TID) | ORAL | Status: DC

## 2013-10-04 NOTE — Progress Notes (Signed)
ID: [name]   DOB: [birth year]  MR#: [id number]  TDH#:[id number]  PCP: Shon Baton MD GYN: Gus Height MD SU: Alphonsa Overall MD Lisman: Thea Silversmith, MD OTHER:  Zenovia Jarred, MD  CHIEF COMPLAINT:  Hx of Left Breast Cancer   HISTORY OF PRESENT ILLNESS:  "Jennifer Walker" had routine screening mammography at [facility] 09/22/2011 showing a possible mass in the left breast. Diagnostic left mammography and ultrasonography at the [facility] 10/05/2011 confirmed an irregular mass in the left upper outer quadrant which was not palpable. Ultrasound showed this to be irregular, solid, and to measure 1.2 cm. The left axilla was unremarkable.  Biopsy of the left breast mass on the same day showed ([id number]) an invasive ductal carcinoma, grade 1, estrogen receptor 100% and progesterone receptor 39% positive, with no HER-2 amplification, and an MIB-1 of 1.32. Jennifer Walker proceeded to bilateral breast MRI 10/08/2011. This confirmed a 1.9 cm enhancing mass in the upper outer portion of the left breast, and found a second, 7 mm mass in the more anterior aspect of the same breast. This second mass was identified by ultrasonography 10/12/2011 and biopsy showed ([id number]) an intraductal papilloma.  Accordingly, on 11/01/2011 the patient underwent double lumpectomy (left upper and left inferior breast) with left axillary sentinel lymph node biopsy under Alphonsa Overall. The final pathology ([id number]) showed the left inferior lumpectomy to consist of a fibroadenoma. The left upper lumpectomy showed a 1.8 cm invasive ductal carcinoma, grade 1, with negative margins (closest distance 8 mm) and a negative sentinel lymph node.   She continues on the anastrozole with good tolerance.  She has no increased joint pain or increased hot flashes.   She does have some vaginal dryness and dyspareunia which have worsened with both menopause and the initiation of anastrozole. She also can "feel her cervix dropping" at times,  and her description is consistent with uterine prolapse. She denies any general pain, and has had no abnormal bleeding. She saw Dr Harrington Challenger and tried 1-2 medications with no improvement in her symptoms.  Of course as noted previously, a CT scan in July of 2014 showed an 8 mm lingular opacity felt to be benign. A chest x-ray was obtained last week and was unremarkable.  She did undergo a CT scan on 09/26/13   FINDINGS: The previously seen lingular nodule is no longer visualized. Linear densities more inferiorly in the lingula compatible with scarring. No suspicious pulmonary nodules. No pleural effusions. Surgical clips an adjacent soft tissue thickening noted in the left breast laterally, likely post surgical scarring. Chest wall soft tissues otherwise unremarkable. Heart is normal size. Aorta is normal caliber. No mediastinal, hilar, or axillary adenopathy. Imaging into the upper abdomen shows no acute findings. No acute bony abnormality or focal bone lesion. IMPRESSION: Resolution of the previously seen lingular nodule. Minimal residual lingular scarring. No suspicious pulmonary nodules.  The patient was told these findings are good and we don't need to repeat another imaging study unless she has some new onset of symptoms. She is scheduled to get her mammogram later this month and if those mammograms are fine, we can switch to yearly mammograms. Her lab results were reviewed and they are within normal limit.  Her biggest complaint today was insomnia and she has been on Ambien for last 5 years she would like to come off Ambien and try a new medicine. I gave her a prescription for lorazepam 0.5 mg at bedtime and we will see if it is more  effective than Ambien. She is still maintaining a good exercise schedule where she either walks 3 miles a day or play tennis.  REVIEW OF SYSTEMS: Jennifer Walker denies any recent illnesses and has had no fevers or chills. She continues to have insomnia which is a chronic complaint.  Her energy level is good. She is exercising on a regular basis. She continues to have reflux although it has improved with daily Prilosec. She denies any nausea or emesis and has noted no change in bowel or bladder habits. She's had no cough, shortness of breath, chest pain, or palpitations. She denies any abnormal headaches or dizziness and has had no change in vision. She also denies any increased myalgias, arthralgias, bony pain, or peripheral swelling.  A detailed review of systems is otherwise stable and noncontributory.  PAST MEDICAL HISTORY: Past Medical History  Diagnosis Date  . Insomnia   . H/O colonoscopy 2003  . Wears glasses   . Fatigue   . Psoriasis   . Hot flashes   . PONV (postoperative nausea and vomiting)   . Breast cancer     Invasive ductal ca grade 1 er/pr positive, her2  negative    PAST SURGICAL HISTORY: Past Surgical History  Procedure Laterality Date  . Knee surgery  2005, 2007    bilateral   . Dilation and curettage of uterus    . Tumor removal  1975    right breast  . Lymph gland excision  1978    right axillary  . Mandible surgery  1982  . Breast surgery  10/1976    right breast benign tumor  . Breast lumpectomy  11/01/2011    left breast lumpectomy    FAMILY HISTORY Family History  Problem Relation Age of Onset  . Breast cancer Mother 3    Inflammatory breast cancer  . Cancer Mother     breast  . Heart attack Father   . Heart disease Father   . Cancer Cousin     breast  . Colon cancer Neg Hx   . Colon polyps Neg Hx   . Stomach cancer Neg Hx   . Rectal cancer Neg Hx    the patient's father died at the age of 75 from a myocardial infarction, in the setting of a motorcycle accident. The patient's mother died from inflammatory breast cancer at the age of 70. It had been diagnosed at age 40. The patient has one brother and one sister. There is no other breast or ovarian cancer or other cancer in the immediate family.  GYNECOLOGIC HISTORY:    Menarche age 40, menopause 2009. She did not take hormone replacement. First live birth was age 71. She is GX P2.  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 Her husband Herbie Baltimore works as a Government social research officer for [employer]. Their children are Patsey Berthold Junior ("Wes"), 17, and Aggie Moats McGrath"), 15. The patient attends a [employer]. She used to live in [place] but now she has moved to [place].  ADVANCED DIRECTIVES: Not in place  HEALTH MAINTENANCE:  (Obtained January 2015) History  Substance Use Topics  . Smoking status: Never Smoker   . Smokeless tobacco: Never Used  . Alcohol Use: 4.2 oz/week    7 Cans of beer per week     Comment: 1 beer per night     Colonoscopy: May 2014/[facility]  PAP: July 2014/[facility]  Bone density: Oct 2013, Normal  Lipid panel:  Not on file/[facility]   Allergies  Allergen Reactions  . Sulfa Antibiotics Other (See Comments)    Yeast infections and ulcers  . Sulfa Drugs Cross Reactors   . Tape Rash    Current Outpatient Prescriptions  Medication Sig Dispense Refill  . anastrozole (ARIMIDEX) 1 MG tablet Take 1 tablet (1 mg total) by mouth daily.  30 tablet  2  . calcipotriene-betamethasone (TACLONEX) ointment Apply 1 application topically daily as needed. For psoriasis      . clobetasol (TEMOVATE) 0.05 % external solution Apply 1 application topically daily as needed. For psoriasis      . Glucosamine-Chondroit-Vit C-Mn (GLUCOSAMINE 1500 COMPLEX PO) Take 2 tablets by mouth daily.      . Multiple Vitamins-Minerals (MULTIVITAMINS THER. W/MINERALS) TABS Take 1 tablet by mouth daily.      . nitrofurantoin, macrocrystal-monohydrate, (MACROBID) 100 MG capsule Take 100 mg by mouth daily as needed. Takes when pt has sex      . Nutritional Supplements (MELATONIN PO) Take 1-2 tablets by mouth at bedtime as needed. For sleep      . omeprazole (PRILOSEC) 40 MG capsule Take 1 capsule (40  mg total) by mouth daily.  30 capsule  6  . zolpidem (AMBIEN) 10 MG tablet Take 10 mg by mouth at bedtime as needed. For sleep      . LORazepam (ATIVAN) 0.5 MG tablet Take 1 tablet (0.5 mg total) by mouth every 8 (eight) hours.  30 tablet  0   No current facility-administered medications for this visit.    OBJECTIVE: Middle-aged white woman in no acute distress Filed Vitals:   10/04/13 1607  BP: 113/75  Pulse: 71  Temp: 98.2 F (36.8 C)  Resp: 18     Body mass index is 30.03 kg/(m^2).    ECOG FS: 0 Filed Weights   10/04/13 1607  Weight: 215 lb 3.2 oz (97.614 kg)   Physical Exam: HEENT:  Sclerae anicteric.  Oropharynx clear and moist. Neck supple. Trachea midline. The thyromegaly palpated. NODES:  No cervical or supraclavicular lymphadenopathy palpated.  BREAST EXAM:  Right breast is unremarkable. Left breast is status post lumpectomy and radiation, with no suspicious nodularities or skin changes, and no evidence of local recurrence. Axillae are benign bilaterally, no palpable lymphadenopathy. The exam was done in the presence of a nursing staff. LUNGS:  Clear to auscultation bilaterally with good excursion.  No wheezes or rhonchi HEART:  Regular rate and rhythm. No murmur  ABDOMEN:  Soft, nontender. No organomegaly palpated.  Positive bowel sounds.  MSK:  No focal spinal tenderness to palpation. 4 range of motion bilaterally in the upper extremities EXTREMITIES:  No peripheral edema.   SKIN:  There is a small scaly patch of psoriasis noted on the left abdomen. Otherwise, benign with no visible rashes or skin lesions. No nail dyscrasia. No pallor. NEURO:  Nonfocal. Well oriented.  Appropriate affect.    LAB RESULTS: Lab Results  Component Value Date   WBC 4.1 09/26/2013   NEUTROABS 2.6 09/26/2013   HGB 12.7 09/26/2013   HCT 38.1 09/26/2013   MCV 93.8 09/26/2013   PLT 244 09/26/2013      Chemistry      Component Value Date/Time   NA 142 09/26/2013 0932   NA 139 10/13/2011 0816   K  4.4 09/26/2013 0932   K 4.2 10/13/2011 0816   CL 102 05/02/2012 1028   CL 101 10/13/2011 0816   CO2 28 09/26/2013 0932   CO2 30 10/13/2011  0816   BUN 18.1 09/26/2013 0932   BUN 20 10/13/2011 0816   CREATININE 0.9 09/26/2013 0932   CREATININE 0.89 10/13/2011 0816      Component Value Date/Time   CALCIUM 9.0 09/26/2013 0932   CALCIUM 9.6 10/13/2011 0816   ALKPHOS 47 09/26/2013 0932   ALKPHOS 45 10/13/2011 0816   AST 19 09/26/2013 0932   AST 18 10/13/2011 0816   ALT 15 09/26/2013 0932   ALT 12 10/13/2011 0816   BILITOT 0.24 09/26/2013 0932   BILITOT 0.4 10/13/2011 0816       Lab Results  Component Value Date   LABCA2 5 11/23/2011     STUDIES:  Most recent bone density on 10-13 was normal.  Most recent bilateral mammogram on 09/25/2012 was unremarkable. She's getting another one in July 2015.  .   Dg Chest 2 View 04/16/2013   CLINICAL DATA:  Breast cancer.  Follow-up lingular density.  EXAM: CHEST  2 VIEW  COMPARISON:  Chest CT 10/04/2012  FINDINGS: The Cardiac and mediastinal contours normal are normal. Negative for heart failure. Lungs are clear without infiltrate effusion or mass.  Small lingular density on prior CT is not identified however would be difficult to see on a chest x-ray.  IMPRESSION: No active cardiopulmonary disease.   Electronically Signed   By: Franchot Gallo M.D.   On: 04/16/2013 10:46   She did undergo a CT scan on 09/26/13  FINDINGS: The previously seen lingular nodule is no longer visualized. Linear densities more inferiorly in the lingula compatible with scarring. No suspicious pulmonary nodules. No pleural effusions. Surgical clips an adjacent soft tissue thickening noted in the left breast laterally, likely post surgical scarring. Chest wall soft tissues otherwise unremarkable. Heart is normal size. Aorta is normal caliber. No mediastinal, hilar, or axillary adenopathy. Imaging into the upper abdomen shows no acute findings. No acute bony abnormality or focal bone lesion. IMPRESSION:  Resolution of the previously seen lingular nodule. Minimal residual lingular scarring. No suspicious pulmonary nodules.     ASSESSMENT: 58 y.o. [place] woman   (1)  status post left lumpectomy and sentinel lymph node sampling 11/01/2011 for a pT1c pN0, stage IA invasive ductal carcinoma, grade 1, estrogen receptor 100% and progesterone receptor 39% positive, with no HER-2 amplification, and an MIB-1 of 8%.  (2) the Oncotype DX recurrent score is 13, predicting a distant recurrence rate of 8% in women like her who take tamoxifen for 5 years.  (3) adjuvant radiation completed 01/27/2012  (4) anastrozole started November 2013  (4) incidentally noted 91m lingular opacity on CT scan July 2014. Chest x-ray unremarkable in January 2015. A repeat chest CT was recommended for July 2015 to assess stability. This scan is showing resolution of that lingular nodule. There are no other signs of metastatic disease.  PLAN:  With regards to her breast cancer, LVaughan Bastaseems to be doing well, with no clinical evidence of disease recurrence. She'll continue on the anastrozole, our goal being to continue for a total of 5 years (until November 2018).  We will follow her bone density for any changes, the next being due in October 2015.  We will see her again in 6 months for routine followup including labs and physical exam. Her mammogram is due this month and we will follow the results.  For her insomnia, I gave her a prescription of lorazepam and also gave her a schedule of how to wean from Ambien and switch to lorazepam. Patient was appreciative.  She  continues to take antibiotics for recurrent urinary tract infections on a PRN basis.  All this was reviewed in detail with Jennifer Walker today, and she voices her understanding and agreement with the above. She will call with any changes prior to her next appointment.  Rolfe Hartsell SHAHEEN, md     10/04/2013

## 2013-10-05 ENCOUNTER — Telehealth: Payer: Self-pay | Admitting: Oncology

## 2013-10-05 NOTE — Telephone Encounter (Signed)
lvm for pt regarding to Dec adn Jan 2016 appt....mailed pt appt sched/avs and letter

## 2013-10-16 ENCOUNTER — Ambulatory Visit
Admission: RE | Admit: 2013-10-16 | Discharge: 2013-10-16 | Disposition: A | Discharge status: Home / Self Care | Payer: 59 | Source: Ambulatory Visit | Attending: Physician Assistant | Admitting: Physician Assistant

## 2013-10-16 DIAGNOSIS — Z853 Personal history of malignant neoplasm of breast: Secondary | ICD-10-CM

## 2013-10-18 ENCOUNTER — Other Ambulatory Visit: Payer: Self-pay | Admitting: Obstetrics and Gynecology

## 2013-10-19 LAB — CYTOLOGY - PAP

## 2014-03-28 ENCOUNTER — Other Ambulatory Visit (HOSPITAL_BASED_OUTPATIENT_CLINIC_OR_DEPARTMENT_OTHER): Payer: 59

## 2014-03-28 DIAGNOSIS — F5102 Adjustment insomnia: Secondary | ICD-10-CM

## 2014-03-28 DIAGNOSIS — C50419 Malignant neoplasm of upper-outer quadrant of unspecified female breast: Secondary | ICD-10-CM

## 2014-03-28 LAB — CBC & DIFF AND RETIC
BASO%: 1 % (ref 0.0–2.0)
Basophils Absolute: 0 10*3/uL (ref 0.0–0.1)
EOS ABS: 0.1 10*3/uL (ref 0.0–0.5)
EOS%: 4.1 % (ref 0.0–7.0)
HEMATOCRIT: 38.3 % (ref 34.8–46.6)
HEMOGLOBIN: 12.5 g/dL (ref 11.6–15.9)
Immature Retic Fract: 2.2 % (ref 1.60–10.00)
LYMPH%: 29.8 % (ref 14.0–49.7)
MCH: 30.7 pg (ref 25.1–34.0)
MCHC: 32.6 g/dL (ref 31.5–36.0)
MCV: 94.1 fL (ref 79.5–101.0)
MONO#: 0.4 10*3/uL (ref 0.1–0.9)
MONO%: 12.2 % (ref 0.0–14.0)
NEUT#: 1.6 10*3/uL (ref 1.5–6.5)
NEUT%: 52.9 % (ref 38.4–76.8)
PLATELETS: 250 10*3/uL (ref 145–400)
RBC: 4.07 10*6/uL (ref 3.70–5.45)
RDW: 12.4 % (ref 11.2–14.5)
RETIC %: 1.27 % (ref 0.70–2.10)
RETIC CT ABS: 51.69 10*3/uL (ref 33.70–90.70)
WBC: 3 10*3/uL — AB (ref 3.9–10.3)
lymph#: 0.9 10*3/uL (ref 0.9–3.3)

## 2014-03-28 LAB — COMPREHENSIVE METABOLIC PANEL (CC13)
ALBUMIN: 4.1 g/dL (ref 3.5–5.0)
ALK PHOS: 54 U/L (ref 40–150)
ALT: 18 U/L (ref 0–55)
AST: 25 U/L (ref 5–34)
Anion Gap: 8 mEq/L (ref 3–11)
BILIRUBIN TOTAL: 0.58 mg/dL (ref 0.20–1.20)
BUN: 14.7 mg/dL (ref 7.0–26.0)
CO2: 28 mEq/L (ref 22–29)
Calcium: 9.2 mg/dL (ref 8.4–10.4)
Chloride: 104 mEq/L (ref 98–109)
Creatinine: 0.9 mg/dL (ref 0.6–1.1)
EGFR: 76 mL/min/{1.73_m2} — AB (ref >90)
Glucose: 97 mg/dl (ref 70–140)
POTASSIUM: 4.4 meq/L (ref 3.5–5.1)
SODIUM: 140 meq/L (ref 136–145)
TOTAL PROTEIN: 6.9 g/dL (ref 6.4–8.3)

## 2014-04-04 ENCOUNTER — Ambulatory Visit (HOSPITAL_BASED_OUTPATIENT_CLINIC_OR_DEPARTMENT_OTHER): Payer: 59 | Admitting: Hematology and Oncology

## 2014-04-04 ENCOUNTER — Other Ambulatory Visit: Payer: 59

## 2014-04-04 ENCOUNTER — Telehealth: Payer: Self-pay | Admitting: Hematology and Oncology

## 2014-04-04 DIAGNOSIS — C50412 Malignant neoplasm of upper-outer quadrant of left female breast: Secondary | ICD-10-CM

## 2014-04-04 DIAGNOSIS — Z853 Personal history of malignant neoplasm of breast: Secondary | ICD-10-CM

## 2014-04-04 NOTE — Telephone Encounter (Signed)
, °

## 2014-04-04 NOTE — Progress Notes (Signed)
Patient Care Team: Precious Reel, MD as PCP - General (Internal Medicine) Gus Height, MD as Consulting Physician (Obstetrics and Gynecology) Precious Reel, MD as Consulting Physician (Internal Medicine) Thea Silversmith, MD as Consulting Physician (Radiation Oncology) Eston Esters, MD as Consulting Physician (Hematology and Oncology)  DIAGNOSIS: Primary cancer of upper outer quadrant of left female breast   Staging form: Breast, AJCC 7th Edition     Clinical: Stage IA (T1c, N0, cM0) - Unsigned       Staging comments: Staged in breast conference 7.17.13      Pathologic: No stage assigned - Unsigned   SUMMARY OF ONCOLOGIC HISTORY:   Primary cancer of upper outer quadrant of left female breast   11/01/2011 Surgery Left lumpectomy T1 cN0 M0 stage IA invasive ductal carcinoma grade 1 ER 100%, PR 39%, HER-2 negative, Ki-67 8%, Oncotype DX score 13, 8% ROR   01/11/2012 - 01/27/2012 Radiation Therapy Adjuvant radiation therapy   02/03/2012 -  Anti-estrogen oral therapy Anastrozole 1 mg daily    CHIEF COMPLIANT: Follow-up of breast cancer  INTERVAL HISTORY: Jennifer Walker is a 59 year old lady with above-mentioned history of left-sided breast cancer treated with lumpectomy radiation and is currently an oral antiestrogen therapy with anastrozole 1 mg daily started November 2013. She is tolerating anastrozole extremely well without any major problems other than musculoskeletal aches and pains. She plays tennis every day and watches what she eats and staying very healthy and active. She has 2 daughters who are teenagers and she is very worried about their future. She wants to do everything she can in her power to prevent this breast cancer from coming back. And hence she wants to stay on anastrozole beyond 5 years.  REVIEW OF SYSTEMS:   Constitutional: Denies fevers, chills or abnormal weight loss Eyes: Denies blurriness of vision Ears, nose, mouth, throat, and face: Denies mucositis or sore  throat Respiratory: Denies cough, dyspnea or wheezes Cardiovascular: Denies palpitation, chest discomfort or lower extremity swelling Gastrointestinal:  Denies nausea, heartburn or change in bowel habits Skin: Denies abnormal skin rashes Lymphatics: Denies new lymphadenopathy or easy bruising Neurological:Denies numbness, tingling or new weaknesses Behavioral/Psych: Mood is stable, no new changes  Breast:  denies any pain or lumps or nodules in either breasts All other systems were reviewed with the patient and are negative.  I have reviewed the past medical history, past surgical history, social history and family history with the patient and they are unchanged from previous note.  ALLERGIES:  is allergic to sulfa antibiotics; sulfa drugs cross reactors; and tape.  MEDICATIONS:  Current Outpatient Prescriptions  Medication Sig Dispense Refill  . anastrozole (ARIMIDEX) 1 MG tablet Take 1 tablet (1 mg total) by mouth daily. 30 tablet 2  . calcipotriene-betamethasone (TACLONEX) ointment Apply 1 application topically daily as needed. For psoriasis    . clobetasol (TEMOVATE) 0.05 % external solution Apply 1 application topically daily as needed. For psoriasis    . Glucosamine-Chondroit-Vit C-Mn (GLUCOSAMINE 1500 COMPLEX PO) Take 2 tablets by mouth daily.    . Multiple Vitamins-Minerals (MULTIVITAMINS THER. W/MINERALS) TABS Take 1 tablet by mouth daily.    . nitrofurantoin, macrocrystal-monohydrate, (MACROBID) 100 MG capsule Take 100 mg by mouth daily as needed. Takes when pt has sex    . Nutritional Supplements (MELATONIN PO) Take 1-2 tablets by mouth at bedtime as needed. For sleep    . omeprazole (PRILOSEC) 40 MG capsule Take 1 capsule (40 mg total) by mouth daily. 30 capsule 6  .  zolpidem (AMBIEN) 10 MG tablet Take 10 mg by mouth at bedtime as needed. For sleep     No current facility-administered medications for this visit.    PHYSICAL EXAMINATION: ECOG PERFORMANCE STATUS: 0 -  Asymptomatic  Filed Vitals:   04/04/14 1159  BP: 102/64  Pulse: 67  Temp: 98.1 F (36.7 C)  Resp: 18   Filed Weights   04/04/14 1159  Weight: 219 lb 12.8 oz (99.701 kg)    GENERAL:alert, no distress and comfortable SKIN: skin color, texture, turgor are normal, no rashes or significant lesions EYES: normal, Conjunctiva are pink and non-injected, sclera clear OROPHARYNX:no exudate, no erythema and lips, buccal mucosa, and tongue normal  NECK: supple, thyroid normal size, non-tender, without nodularity LYMPH:  no palpable lymphadenopathy in the cervical, axillary or inguinal LUNGS: clear to auscultation and percussion with normal breathing effort HEART: regular rate & rhythm and no murmurs and no lower extremity edema ABDOMEN:abdomen soft, non-tender and normal bowel sounds Musculoskeletal:no cyanosis of digits and no clubbing  NEURO: alert & oriented x 3 with fluent speech, no focal motor/sensory deficits BREAST: No palpable masses or nodules in either right or left breasts. No palpable axillary supraclavicular or infraclavicular adenopathy no breast tenderness or nipple discharge.   LABORATORY DATA:  I have reviewed the data as listed   Chemistry      Component Value Date/Time   NA 140 03/28/2014 1113   NA 139 10/13/2011 0816   K 4.4 03/28/2014 1113   K 4.2 10/13/2011 0816   CL 102 05/02/2012 1028   CL 101 10/13/2011 0816   CO2 28 03/28/2014 1113   CO2 30 10/13/2011 0816   BUN 14.7 03/28/2014 1113   BUN 20 10/13/2011 0816   CREATININE 0.9 03/28/2014 1113   CREATININE 0.89 10/13/2011 0816      Component Value Date/Time   CALCIUM 9.2 03/28/2014 1113   CALCIUM 9.6 10/13/2011 0816   ALKPHOS 54 03/28/2014 1113   ALKPHOS 45 10/13/2011 0816   AST 25 03/28/2014 1113   AST 18 10/13/2011 0816   ALT 18 03/28/2014 1113   ALT 12 10/13/2011 0816   BILITOT 0.58 03/28/2014 1113   BILITOT 0.4 10/13/2011 0816       Lab Results  Component Value Date   WBC 3.0* 03/28/2014    HGB 12.5 03/28/2014   HCT 38.3 03/28/2014   MCV 94.1 03/28/2014   PLT 250 03/28/2014   NEUTROABS 1.6 03/28/2014     RADIOGRAPHIC STUDIES: I have personally reviewed the radiology reports and agreed with their findings. Mammogram July 2015 is normal  ASSESSMENT & PLAN:  Breast cancer of upper-outer quadrant of left female breast Left breast invasive ductal carcinoma T1 cN0 M0 stage IA grade 1, ER 100%, PR 39%, HER-2 negative, Ki-67 8%, Oncotype DX recurrence score 13, it was not a lot, status post radiation therapy and currently on anastrozole started November 2013  Anastrozole toxicities: 1. Musculoskeletal aches and pains but not severe 2. Occasional hot flashes   Patient wanted to know if she can stay on anastrozole beyond 5 years. I discussed with her that we can wait till 2018 to make the decision. If she wanted to stay beyond 5 years, I would consider leaving her on it.  Breast cancer surveillance: 1. Mammograms July 2015 normal 2. Breast exam 04/04/2014 is normal  Lung nodule: Resolved based on CT scan done in July 2015  Since the patient is tolerating anastrozole extremely well, we will see her back in one year.  Orders Placed This Encounter  Procedures  . CBC with Differential    Standing Status: Future     Number of Occurrences:      Standing Expiration Date: 04/04/2015  . Comprehensive metabolic panel (Cmet) - [facility]    Standing Status: Future     Number of Occurrences:      Standing Expiration Date: 04/04/2015   The patient has a good understanding of the overall plan. she agrees with it. She will call with any problems that may develop before her next visit here.   Rulon Eisenmenger, MD 04/04/2014 12:44 PM

## 2014-04-04 NOTE — Assessment & Plan Note (Signed)
Left breast invasive ductal carcinoma T1 cN0 M0 stage IA grade 1, ER 100%, PR 39%, HER-2 negative, Ki-67 8%, Oncotype DX recurrence score 13, it was not a lot, status post radiation therapy and currently on anastrozole started November 2013  Anastrozole toxicities: 1. Musculoskeletal aches and pains but not severe 2. Occasional hot flashes   Patient wanted to know if she can stay on anastrozole beyond 5 years. I discussed with her that we can wait till 2018 to make the decision. If she wanted to stay beyond 5 years, I would consider leaving her on it.  Breast cancer surveillance: 1. Mammograms July 2015 normal 2. Breast exam 04/04/2014 is normal  Lung nodule: Resolved based on CT scan done in July 2015  Since the patient is tolerating anastrozole extremely well, we will see her back in one year.

## 2014-07-29 ENCOUNTER — Other Ambulatory Visit: Payer: Self-pay | Admitting: Oncology

## 2014-07-29 NOTE — Telephone Encounter (Signed)
Last OV 04/04/14.  Next OV 04/07/15.  Chart reviewed.

## 2014-08-08 ENCOUNTER — Ambulatory Visit: Payer: 59 | Admitting: Podiatry

## 2014-09-10 ENCOUNTER — Other Ambulatory Visit: Payer: Self-pay | Admitting: Hematology and Oncology

## 2014-09-10 DIAGNOSIS — Z853 Personal history of malignant neoplasm of breast: Secondary | ICD-10-CM

## 2014-10-03 ENCOUNTER — Telehealth: Payer: Self-pay | Admitting: *Deleted

## 2014-10-03 NOTE — Telephone Encounter (Signed)
Patient called stating that she had swollen nodules on her chest. Patient stated that about 5 weeks ago she saw her PCP when she had swelling to her right ear and neck, reported that she had a "knot" on both her right and left side just below her collarbone. She also noted a scabbed area to her right scalp and thought it was all related to a tic bite although no tic was found. She had an ultrasound done at her PCP and was started on antibiotics. She states that the "knots" have gotten smaller but are still there. She said that she also has a 3rd nodule but isn't sure if she has had it all along or she just noticed it. Patient does have a f/u appt with PCP next Wednesday but is not sure if she should wait until then. Advised patient to call PCP to see if she can be seen earlier. She also stated that her PCP told her he could do a biopsy. Advised patient to call us back if she still had questions. She verbalized understanding.

## 2014-10-21 ENCOUNTER — Ambulatory Visit
Admission: RE | Admit: 2014-10-21 | Discharge: 2014-10-21 | Disposition: A | Discharge status: Home / Self Care | Payer: 59 | Source: Ambulatory Visit | Attending: Hematology and Oncology | Admitting: Hematology and Oncology

## 2014-10-21 DIAGNOSIS — Z853 Personal history of malignant neoplasm of breast: Secondary | ICD-10-CM

## 2014-11-11 ENCOUNTER — Ambulatory Visit
Admission: RE | Admit: 2014-11-11 | Discharge: 2014-11-11 | Disposition: A | Discharge status: Home / Self Care | Payer: 59 | Source: Ambulatory Visit | Attending: General Surgery | Admitting: General Surgery

## 2014-11-11 ENCOUNTER — Other Ambulatory Visit: Payer: Self-pay

## 2014-11-11 DIAGNOSIS — R590 Localized enlarged lymph nodes: Secondary | ICD-10-CM

## 2015-02-04 ENCOUNTER — Ambulatory Visit (INDEPENDENT_AMBULATORY_CARE_PROVIDER_SITE_OTHER): Payer: 59

## 2015-02-04 ENCOUNTER — Encounter: Payer: Self-pay | Admitting: Podiatry

## 2015-02-04 ENCOUNTER — Ambulatory Visit (INDEPENDENT_AMBULATORY_CARE_PROVIDER_SITE_OTHER): Payer: 59 | Admitting: Podiatry

## 2015-02-04 VITALS — BP 106/71 | HR 66 | Resp 16

## 2015-02-04 DIAGNOSIS — M779 Enthesopathy, unspecified: Secondary | ICD-10-CM

## 2015-02-04 DIAGNOSIS — M6789 Other specified disorders of synovium and tendon, multiple sites: Secondary | ICD-10-CM

## 2015-02-04 DIAGNOSIS — M76821 Posterior tibial tendinitis, right leg: Secondary | ICD-10-CM | POA: Diagnosis not present

## 2015-02-04 DIAGNOSIS — M76829 Posterior tibial tendinitis, unspecified leg: Secondary | ICD-10-CM

## 2015-02-04 NOTE — Progress Notes (Signed)
   Subjective:    Patient ID: Jennifer Walker, female    DOB: 02-25-1956, 59 y.o.   MRN: [id number]  HPI: She presents today as a 59 year old white female an avid tennis player, [employer]. She states that she's noticed that she has some weakness of her ankles and has noticed some medial longitudinal arch collapse over the past several months. She has had issues with her knees in the past and does not want her feet to follow suit. She is currently wearing braces to help support her ankles and feet and she says it seems to be doing much better. She demonstrates a photograph on her cell phone which is taking just a few days ago as she had swelling to the right foot and ankle. The photograph demonstrates a swollen posterior tibial tendon of the right foot. She denies trauma other than being very active individual.  Review of Systems  Cardiovascular: Positive for leg swelling.  All other systems reviewed and are negative.      Objective:   Physical Exam: 59 year old female presents in no apparent distress good humor and well-nourished. She appears to be physically fit. Vital signs are stable she is alert and oriented 3. Pulses are strongly palpable. Neurologic sensorium is intact per Semmes-Weinstein monofilament. Deep tendon reflexes are intact bilateral and muscle strength +5 over 5 dorsiflexors plantar flexors and inverters and everters all intrinsic musculature is intact. She has tenderness on palpation of the posterior tibial tendon right foot over the left. Muscle strength +5 over 5 dorsiflexors plantar flexors inverters and everters all intrinsic musculature is intact. Orthopedic evaluation was traced all joints distal to the ankle for range of motion without crepitus she has mild fluctuance with inside the tendon sheath of the right posterior tibial tendon. Upon evaluation of her standing she does demonstrate some flattening of the medial longitudinal arch on the right foot  with eversion of the foot pronation of the arch area. Radiographs do not demonstrate any type of osseus abnormalities other than a midfoot breech on the right side. Cutaneous evaluation of a straight supple well-hydrated cutis no erythema edema cellulitis drainage or odor.        Assessment & Plan:  Assessment: Flattening of the medial longitudinal arch possible posterior tibial tendon dysfunction.  Plan: Discussed etiology pathology conservative versus surgical therapies. At this point I highly recommend orthotic device and she was scanned for these today.

## 2015-02-28 ENCOUNTER — Ambulatory Visit: Payer: 59 | Admitting: *Deleted

## 2015-02-28 DIAGNOSIS — M779 Enthesopathy, unspecified: Secondary | ICD-10-CM

## 2015-02-28 NOTE — Patient Instructions (Signed)

## 2015-02-28 NOTE — Progress Notes (Signed)
Patient ID: Jennifer Walker, female   DOB: 11/16/1955, 59 y.o.   MRN: [id number] Patient presents for orthotic pick up.  Verbal and written break in and wear instructions given.  Patient will follow up in 4 weeks if symptoms worsen or fail to improve.

## 2015-03-27 ENCOUNTER — Ambulatory Visit: Payer: 59 | Admitting: Podiatry

## 2015-04-07 ENCOUNTER — Ambulatory Visit (HOSPITAL_BASED_OUTPATIENT_CLINIC_OR_DEPARTMENT_OTHER): Payer: 59 | Admitting: Hematology and Oncology

## 2015-04-07 ENCOUNTER — Telehealth: Payer: Self-pay | Admitting: Hematology and Oncology

## 2015-04-07 ENCOUNTER — Encounter: Payer: Self-pay | Admitting: Hematology and Oncology

## 2015-04-07 ENCOUNTER — Other Ambulatory Visit (HOSPITAL_BASED_OUTPATIENT_CLINIC_OR_DEPARTMENT_OTHER): Payer: 59

## 2015-04-07 ENCOUNTER — Other Ambulatory Visit: Payer: Self-pay

## 2015-04-07 VITALS — BP 133/74 | HR 67 | Temp 98.2°F | Resp 18 | Wt 223.8 lb

## 2015-04-07 DIAGNOSIS — C50412 Malignant neoplasm of upper-outer quadrant of left female breast: Secondary | ICD-10-CM

## 2015-04-07 DIAGNOSIS — R609 Edema, unspecified: Secondary | ICD-10-CM

## 2015-04-07 DIAGNOSIS — Z79811 Long term (current) use of aromatase inhibitors: Secondary | ICD-10-CM

## 2015-04-07 LAB — CBC WITH DIFFERENTIAL/PLATELET
BASO%: 0.9 % (ref 0.0–2.0)
BASOS ABS: 0 10*3/uL (ref 0.0–0.1)
EOS ABS: 0.1 10*3/uL (ref 0.0–0.5)
EOS%: 3.8 % (ref 0.0–7.0)
HEMATOCRIT: 39.1 % (ref 34.8–46.6)
HEMOGLOBIN: 13 g/dL (ref 11.6–15.9)
LYMPH#: 1.1 10*3/uL (ref 0.9–3.3)
LYMPH%: 31.7 % (ref 14.0–49.7)
MCH: 30.4 pg (ref 25.1–34.0)
MCHC: 33.2 g/dL (ref 31.5–36.0)
MCV: 91.4 fL (ref 79.5–101.0)
MONO#: 0.3 10*3/uL (ref 0.1–0.9)
MONO%: 8.8 % (ref 0.0–14.0)
NEUT#: 1.9 10*3/uL (ref 1.5–6.5)
NEUT%: 54.8 % (ref 38.4–76.8)
PLATELETS: 247 10*3/uL (ref 145–400)
RBC: 4.28 10*6/uL (ref 3.70–5.45)
RDW: 12 % (ref 11.2–14.5)
WBC: 3.4 10*3/uL — ABNORMAL LOW (ref 3.9–10.3)

## 2015-04-07 LAB — COMPREHENSIVE METABOLIC PANEL
ALBUMIN: 4.1 g/dL (ref 3.5–5.0)
ALK PHOS: 52 U/L (ref 40–150)
ALT: 14 U/L (ref 0–55)
ANION GAP: 8 meq/L (ref 3–11)
AST: 18 U/L (ref 5–34)
BUN: 20.3 mg/dL (ref 7.0–26.0)
CALCIUM: 9.2 mg/dL (ref 8.4–10.4)
CO2: 27 mEq/L (ref 22–29)
Chloride: 105 mEq/L (ref 98–109)
Creatinine: 0.9 mg/dL (ref 0.6–1.1)
EGFR: 72 mL/min/{1.73_m2} — ABNORMAL LOW (ref >90)
Glucose: 106 mg/dl (ref 70–140)
POTASSIUM: 4.4 meq/L (ref 3.5–5.1)
Sodium: 139 mEq/L (ref 136–145)
Total Bilirubin: 0.44 mg/dL (ref 0.20–1.20)
Total Protein: 7.3 g/dL (ref 6.4–8.3)

## 2015-04-07 NOTE — Addendum Note (Signed)
Addended by: Prentiss Bells on: 04/07/2015 02:01 PM   Modules accepted: Medications

## 2015-04-07 NOTE — Assessment & Plan Note (Signed)
Left breast invasive ductal carcinoma T1 cN0 M0 stage IA grade 1, ER 100%, PR 39%, HER-2 negative, Ki-67 8%, Oncotype DX recurrence score 13, it was not a lot, status post radiation therapy and currently on anastrozole started November 2013  Anastrozole toxicities: 1. Musculoskeletal aches and pains but not severe 2. Occasional hot flashes  Patient wanted to know if she can stay on anastrozole beyond 5 years. I discussed with her that we can wait till 2018 to make the decision. If she wanted to stay beyond 5 years, I would consider leaving her on it.  Breast cancer surveillance: 1. Mammograms  10/22/2014 normal  Breast density category B 2. Breast exam 04/04/2014 is normal  Lung nodule: Resolved based on CT scan done in July 2015  Since the patient is tolerating anastrozole extremely well, we will see her back in one year.

## 2015-04-07 NOTE — Telephone Encounter (Signed)
Appointments made and avs printed for patient °

## 2015-04-07 NOTE — Progress Notes (Signed)
Patient Care Team: Shon Baton, MD as PCP - General (Internal Medicine) Harle Battiest, MD as Consulting Physician (Obstetrics and Gynecology) Shon Baton, MD as Consulting Physician (Internal Medicine) Thea Silversmith, MD as Consulting Physician (Radiation Oncology) Eston Esters, MD as Consulting Physician (Hematology and Oncology)  DIAGNOSIS: Primary cancer of upper outer quadrant of left female breast [facility])   Staging form: Breast, AJCC 7th Edition     Clinical: Stage IA (T1c, N0, cM0) - Unsigned       Staging comments: Staged in breast conference 7.17.13      Pathologic: No stage assigned - Unsigned   SUMMARY OF ONCOLOGIC HISTORY:   Primary cancer of upper outer quadrant of left female breast ([place])   11/01/2011 Surgery Left lumpectomy T1 cN0 M0 stage IA invasive ductal carcinoma grade 1 ER 100%, PR 39%, HER-2 negative, Ki-67 8%, Oncotype DX score 13, 8% ROR   01/11/2012 - 01/27/2012 Radiation Therapy Adjuvant radiation therapy   02/03/2012 -  Anti-estrogen oral therapy Anastrozole 1 mg daily    CHIEF COMPLIANT: swelling of the right of the body in addition to palpable cordlike thickening involving the sternoclavicular and acromial clavicular junctions  INTERVAL HISTORY: Jennifer Walker is a 60 year old with above-mentioned history of left breast cancer currently on antiestrogen therapy. She had been doing extremely well up until the last 6 months. She has noticed increased swelling and discomfort in the right half of the body especially the right shoulder, right knee hip and ankle. Her ankle got significantly swollen but it got better. She was prescribed antibiotics for this swelling of the right half of the body which appeared to have helped her. Patient states extremely busy by playing tennis and participates in a lot of physical activities.  REVIEW OF SYSTEMS:   Constitutional: Denies fevers, chills or abnormal weight loss Eyes: Denies blurriness of vision Ears, nose, mouth, throat,  and face: Denies mucositis or sore throat Respiratory: Denies cough, dyspnea or wheezes Cardiovascular: Denies palpitation, chest discomfort Gastrointestinal:  Denies nausea, heartburn or change in bowel habits Skin: Denies abnormal skin rashes Lymphatics: Denies new lymphadenopathy or easy bruising Neurological:Denies numbness, tingling or new weaknesses Behavioral/Psych: Mood is stable, no new changes  Extremities: swollen cordlike thickening at the sternoclavicular junction and acromioclavicular junctions Breast:  denies any pain or lumps or nodules in either breasts All other systems were reviewed with the patient and are negative.  I have reviewed the past medical history, past surgical history, social history and family history with the patient and they are unchanged from previous note.  ALLERGIES:  is allergic to sulfa antibiotics; sulfa drugs cross reactors; and tape.  MEDICATIONS:  Current Outpatient Prescriptions  Medication Sig Dispense Refill  . anastrozole (ARIMIDEX) 1 MG tablet TAKE 1 TABLET DAILY 90 tablet 3  . calcipotriene-betamethasone (TACLONEX) ointment Apply 1 application topically daily as needed. For psoriasis    . clobetasol (TEMOVATE) 0.05 % external solution Apply 1 application topically daily as needed. For psoriasis    . Glucosamine-Chondroit-Vit C-Mn (GLUCOSAMINE 1500 COMPLEX PO) Take 2 tablets by mouth daily.    . Multiple Vitamins-Minerals (MULTIVITAMINS THER. W/MINERALS) TABS Take 1 tablet by mouth daily.    . nitrofurantoin, macrocrystal-monohydrate, (MACROBID) 100 MG capsule Take 100 mg by mouth daily as needed. Takes when pt has sex    . Nutritional Supplements (MELATONIN PO) Take 1-2 tablets by mouth at bedtime as needed. For sleep    . zolpidem (AMBIEN) 10 MG tablet Take 10 mg by mouth at bedtime as  needed. For sleep     No current facility-administered medications for this visit.    PHYSICAL EXAMINATION: ECOG PERFORMANCE STATUS: 1 - Symptomatic  but completely ambulatory  Filed Vitals:   04/07/15 1054  BP: 133/74  Pulse: 67  Temp: 98.2 F (36.8 C)  Resp: 18   Filed Weights   04/07/15 1054  Weight: 223 lb 12.8 oz (101.515 kg)    GENERAL:alert, no distress and comfortable SKIN: skin color, texture, turgor are normal, no rashes or significant lesions EYES: normal, Conjunctiva are pink and non-injected, sclera clear OROPHARYNX:no exudate, no erythema and lips, buccal mucosa, and tongue normal  NECK: supple, thyroid normal size, non-tender, without nodularity LYMPH:  no palpable lymphadenopathy in the cervical, axillary or inguinal LUNGS: clear to auscultation and percussion with normal breathing effort HEART: regular rate & rhythm and no murmurs and no lower extremity edema ABDOMEN:abdomen soft, non-tender and normal bowel sounds MUSCULOSKELETAL:no cyanosis of digits and no clubbing  NEURO: alert & oriented x 3 with fluent speech, no focal motor/sensory deficits EXTREMITIES: No lower extremity edema BREAST: No palpable masses or nodules in either right or left breasts. No palpable axillary supraclavicular or infraclavicular adenopathy no breast tenderness or nipple discharge. There is a cordlike thickening of the sternoclavicular junction and the acromioclavicular junctions (exam performed in the presence of a chaperone)  LABORATORY DATA:  I have reviewed the data as listed   Chemistry      Component Value Date/Time   NA 139 04/07/2015 1045   NA 139 10/13/2011 0816   K 4.4 04/07/2015 1045   K 4.2 10/13/2011 0816   CL 102 05/02/2012 1028   CL 101 10/13/2011 0816   CO2 27 04/07/2015 1045   CO2 30 10/13/2011 0816   BUN 20.3 04/07/2015 1045   BUN 20 10/13/2011 0816   CREATININE 0.9 04/07/2015 1045   CREATININE 0.89 10/13/2011 0816      Component Value Date/Time   CALCIUM 9.2 04/07/2015 1045   CALCIUM 9.6 10/13/2011 0816   ALKPHOS 52 04/07/2015 1045   ALKPHOS 45 10/13/2011 0816   AST 18 04/07/2015 1045   AST 18  10/13/2011 0816   ALT 14 04/07/2015 1045   ALT 12 10/13/2011 0816   BILITOT 0.44 04/07/2015 1045   BILITOT 0.4 10/13/2011 0816       Lab Results  Component Value Date   WBC 3.4* 04/07/2015   HGB 13.0 04/07/2015   HCT 39.1 04/07/2015   MCV 91.4 04/07/2015   PLT 247 04/07/2015   NEUTROABS 1.9 04/07/2015     ASSESSMENT & PLAN:  Primary cancer of upper outer quadrant of left female breast Left breast invasive ductal carcinoma T1 cN0 M0 stage IA grade 1, ER 100%, PR 39%, HER-2 negative, Ki-67 8%, Oncotype DX recurrence score 13, it was not a lot, status post radiation therapy and currently on anastrozole started November 2013  Anastrozole toxicities: 1. Musculoskeletal aches and pains: recently since July 2016, patient had marked swelling of the right side of her body. She had very prominent swelling of the sternoclavicular junction as well as the junction with the clavicle and acromion. She also had ankle swelling along with knee and hip pain. She has a history of psoriatic cysts of the skin. Many of these symptoms have improved over time but she still feels some fullness at the clavicle. I do not believe this is anything to do with antiestrogen therapy. I instructed her that if the symptoms persist, she might need to see rheumatology.  2. Occasional hot flashes  Patient might choose to stay on anastrozole beyond 5 years.  Breast cancer surveillance: 1. Mammograms  10/22/2014 normal  Breast density category B 2. Breast exam 04/07/2015 revealed prominence of the sternoclavicular junction as well as a clavicular and acromial junction. No palpable lumps or nodules in the breast or axilla.  Lung nodule: Resolved based on CT scan done in July 2015  Return to clinic in 6 months for follow-up   Orders Placed This Encounter  Procedures  . CBC with Differential    Standing Status: Future     Number of Occurrences:      Standing Expiration Date: 04/06/2016  . Comprehensive metabolic  panel    Standing Status: Future     Number of Occurrences:      Standing Expiration Date: 04/06/2016   The patient has a good understanding of the overall plan. she agrees with it. she will call with any problems that may develop before the next visit here.   Rulon Eisenmenger, MD 04/07/2015     1

## 2015-07-23 ENCOUNTER — Other Ambulatory Visit: Payer: Self-pay | Admitting: Hematology and Oncology

## 2015-07-23 NOTE — Telephone Encounter (Signed)
Chart reviewed.

## 2015-10-06 ENCOUNTER — Other Ambulatory Visit: Payer: 59

## 2015-10-06 ENCOUNTER — Ambulatory Visit: Payer: 59 | Admitting: Hematology and Oncology

## 2015-10-14 ENCOUNTER — Other Ambulatory Visit: Payer: Self-pay | Admitting: Hematology and Oncology

## 2015-10-14 DIAGNOSIS — Z853 Personal history of malignant neoplasm of breast: Secondary | ICD-10-CM

## 2015-10-15 ENCOUNTER — Ambulatory Visit (HOSPITAL_BASED_OUTPATIENT_CLINIC_OR_DEPARTMENT_OTHER): Payer: 59 | Admitting: Hematology and Oncology

## 2015-10-15 ENCOUNTER — Other Ambulatory Visit (HOSPITAL_BASED_OUTPATIENT_CLINIC_OR_DEPARTMENT_OTHER): Payer: 59

## 2015-10-15 ENCOUNTER — Telehealth: Payer: Self-pay | Admitting: Hematology and Oncology

## 2015-10-15 ENCOUNTER — Encounter: Payer: Self-pay | Admitting: Hematology and Oncology

## 2015-10-15 VITALS — BP 109/70 | HR 58 | Temp 98.0°F | Resp 18 | Wt 214.5 lb

## 2015-10-15 DIAGNOSIS — C50412 Malignant neoplasm of upper-outer quadrant of left female breast: Secondary | ICD-10-CM

## 2015-10-15 DIAGNOSIS — Z79811 Long term (current) use of aromatase inhibitors: Secondary | ICD-10-CM

## 2015-10-15 LAB — COMPREHENSIVE METABOLIC PANEL
ALBUMIN: 4.1 g/dL (ref 3.5–5.0)
ALT: 17 U/L (ref 0–55)
AST: 23 U/L (ref 5–34)
Alkaline Phosphatase: 57 U/L (ref 40–150)
Anion Gap: 9 mEq/L (ref 3–11)
BILIRUBIN TOTAL: 0.69 mg/dL (ref 0.20–1.20)
BUN: 18.5 mg/dL (ref 7.0–26.0)
CALCIUM: 9.7 mg/dL (ref 8.4–10.4)
CHLORIDE: 103 meq/L (ref 98–109)
CO2: 28 mEq/L (ref 22–29)
CREATININE: 1 mg/dL (ref 0.6–1.1)
EGFR: 64 mL/min/{1.73_m2} — ABNORMAL LOW (ref >90)
Glucose: 109 mg/dl (ref 70–140)
Potassium: 4.7 mEq/L (ref 3.5–5.1)
Sodium: 140 mEq/L (ref 136–145)
TOTAL PROTEIN: 7.8 g/dL (ref 6.4–8.3)

## 2015-10-15 LAB — CBC WITH DIFFERENTIAL/PLATELET
BASO%: 1.2 % (ref 0.0–2.0)
BASOS ABS: 0 10*3/uL (ref 0.0–0.1)
EOS ABS: 0.2 10*3/uL (ref 0.0–0.5)
EOS%: 6.7 % (ref 0.0–7.0)
HEMATOCRIT: 42.1 % (ref 34.8–46.6)
HEMOGLOBIN: 14 g/dL (ref 11.6–15.9)
LYMPH#: 1 10*3/uL (ref 0.9–3.3)
LYMPH%: 26.9 % (ref 14.0–49.7)
MCH: 30.7 pg (ref 25.1–34.0)
MCHC: 33.3 g/dL (ref 31.5–36.0)
MCV: 92.1 fL (ref 79.5–101.0)
MONO#: 0.4 10*3/uL (ref 0.1–0.9)
MONO%: 11.6 % (ref 0.0–14.0)
NEUT#: 1.9 10*3/uL (ref 1.5–6.5)
NEUT%: 53.6 % (ref 38.4–76.8)
Platelets: 258 10*3/uL (ref 145–400)
RBC: 4.57 10*6/uL (ref 3.70–5.45)
RDW: 12.4 % (ref 11.2–14.5)
WBC: 3.6 10*3/uL — ABNORMAL LOW (ref 3.9–10.3)

## 2015-10-15 NOTE — Assessment & Plan Note (Signed)
Left breast invasive ductal carcinoma T1 cN0 M0 stage IA grade 1, ER 100%, PR 39%, HER-2 negative, Ki-67 8%, Oncotype DX recurrence score 13, it was not a lot, status post radiation therapy and currently on anastrozole started November 2013  Anastrozole toxicities: 1. Musculoskeletal aches and pains: recently since July 2016, patient had marked swelling of the right side of her body. I instructed her that if the symptoms persist, she might need to see rheumatology. 2. Occasional hot flashes  Patient might choose to stay on anastrozole beyond 5 years. We may consider sending breast cancer index next yr.  Breast cancer surveillance: 1. Mammograms 10/22/2014 normal Breast density category B 2. Breast exam 10/15/2015  No palpable lumps or nodules in the breast or axilla.  Lung nodule: Resolved based on CT scan done in July 2015  Return to clinic in 6 months for follow-up

## 2015-10-15 NOTE — Progress Notes (Signed)
Patient Care Team: Shon Baton, MD as PCP - General (Internal Medicine) Harle Battiest, MD as Consulting Physician (Obstetrics and Gynecology) Shon Baton, MD as Consulting Physician (Internal Medicine) Thea Silversmith, MD as Consulting Physician (Radiation Oncology) Eston Esters, MD as Consulting Physician (Hematology and Oncology)  DIAGNOSIS: Primary cancer of upper outer quadrant of left female breast [facility])   Staging form: Breast, AJCC 7th Edition     Clinical: Stage IA (T1c, N0, cM0) - Unsigned       Staging comments: Staged in breast conference 7.17.13      Pathologic: No stage assigned - Unsigned   SUMMARY OF ONCOLOGIC HISTORY:   Primary cancer of upper outer quadrant of left female breast ([place])   11/01/2011 Surgery Left lumpectomy T1 cN0 M0 stage IA invasive ductal carcinoma grade 1 ER 100%, PR 39%, HER-2 negative, Ki-67 8%, Oncotype DX score 13, 8% ROR   01/11/2012 - 01/27/2012 Radiation Therapy Adjuvant radiation therapy   02/03/2012 -  Anti-estrogen oral therapy Anastrozole 1 mg daily    CHIEF COMPLIANT: Follow-up on anastrozole  INTERVAL HISTORY: Jennifer Walker is a 60 year old with above-mentioned history left breast cancer treated with lumpectomy radiation on his current anastrozole. Last year she complained of diffuse body aches and pains as well as right-sided body swelling. These symptoms gradually improved over time. She is not having too much of these symptoms she does continue to have arthritis in the knees and hips. She had lost about 9 pounds since last year. She has 2 sons ages 3 and 75. One of the sense of an [place] and the other son needs a lot of help from her.  REVIEW OF SYSTEMS:   Constitutional: Denies fevers, chills or abnormal weight loss Eyes: Denies blurriness of vision Ears, nose, mouth, throat, and face: Denies mucositis or sore throat Respiratory: Denies cough, dyspnea or wheezes Cardiovascular: Denies palpitation, chest  discomfort Gastrointestinal:  Denies nausea, heartburn or change in bowel habits Skin: Denies abnormal skin rashes Lymphatics: Denies new lymphadenopathy or easy bruising Neurological:Denies numbness, tingling or new weaknesses Behavioral/Psych: Mood is stable, no new changes  Extremities: Musculoskeletal aches and pains Breast:  denies any pain or lumps or nodules in either breasts All other systems were reviewed with the patient and are negative.  I have reviewed the past medical history, past surgical history, social history and family history with the patient and they are unchanged from previous note.  ALLERGIES:  is allergic to sulfa antibiotics; sulfa drugs cross reactors; and tape.  MEDICATIONS:  Current Outpatient Prescriptions  Medication Sig Dispense Refill  . anastrozole (ARIMIDEX) 1 MG tablet TAKE 1 TABLET DAILY 90 tablet 4  . calcipotriene-betamethasone (TACLONEX) ointment Apply 1 application topically daily as needed. For psoriasis    . clobetasol (TEMOVATE) 0.05 % external solution Apply 1 application topically daily as needed. For psoriasis    . Glucosamine-Chondroit-Vit C-Mn (GLUCOSAMINE 1500 COMPLEX PO) Take 2 tablets by mouth daily.    . Multiple Vitamins-Minerals (MULTIVITAMINS THER. W/MINERALS) TABS Take 1 tablet by mouth daily.    . nitrofurantoin, macrocrystal-monohydrate, (MACROBID) 100 MG capsule Take 100 mg by mouth daily as needed. Takes when pt has sex    . Nutritional Supplements (MELATONIN PO) Take 1-2 tablets by mouth at bedtime as needed. For sleep    . zolpidem (AMBIEN) 10 MG tablet Take 10 mg by mouth at bedtime as needed. For sleep     No current facility-administered medications for this visit.    PHYSICAL EXAMINATION: ECOG PERFORMANCE  STATUS: 1 - Symptomatic but completely ambulatory  Filed Vitals:   10/15/15 1004  BP: 109/70  Pulse: 58  Temp: 98 F (36.7 C)  Resp: 18   Filed Weights   10/15/15 1004  Weight: 214 lb 8 oz (97.297 kg)     GENERAL:alert, no distress and comfortable SKIN: skin color, texture, turgor are normal, no rashes or significant lesions EYES: normal, Conjunctiva are pink and non-injected, sclera clear OROPHARYNX:no exudate, no erythema and lips, buccal mucosa, and tongue normal  NECK: supple, thyroid normal size, non-tender, without nodularity LYMPH:  no palpable lymphadenopathy in the cervical, axillary or inguinal LUNGS: clear to auscultation and percussion with normal breathing effort HEART: regular rate & rhythm and no murmurs and no lower extremity edema ABDOMEN:abdomen soft, non-tender and normal bowel sounds MUSCULOSKELETAL:no cyanosis of digits and no clubbing  NEURO: alert & oriented x 3 with fluent speech, no focal motor/sensory deficits EXTREMITIES: No lower extremity edema BREAST: No palpable masses or nodules in either right or left breasts. No palpable axillary supraclavicular or infraclavicular adenopathy no breast tenderness or nipple discharge. (exam performed in the presence of a chaperone)  LABORATORY DATA:  I have reviewed the data as listed   Chemistry      Component Value Date/Time   NA 139 04/07/2015 1045   NA 139 10/13/2011 0816   K 4.4 04/07/2015 1045   K 4.2 10/13/2011 0816   CL 102 05/02/2012 1028   CL 101 10/13/2011 0816   CO2 27 04/07/2015 1045   CO2 30 10/13/2011 0816   BUN 20.3 04/07/2015 1045   BUN 20 10/13/2011 0816   CREATININE 0.9 04/07/2015 1045   CREATININE 0.89 10/13/2011 0816      Component Value Date/Time   CALCIUM 9.2 04/07/2015 1045   CALCIUM 9.6 10/13/2011 0816   ALKPHOS 52 04/07/2015 1045   ALKPHOS 45 10/13/2011 0816   AST 18 04/07/2015 1045   AST 18 10/13/2011 0816   ALT 14 04/07/2015 1045   ALT 12 10/13/2011 0816   BILITOT 0.44 04/07/2015 1045   BILITOT 0.4 10/13/2011 0816       Lab Results  Component Value Date   WBC 3.6* 10/15/2015   HGB 14.0 10/15/2015   HCT 42.1 10/15/2015   MCV 92.1 10/15/2015   PLT 258 10/15/2015    NEUTROABS 1.9 10/15/2015     ASSESSMENT & PLAN:  Primary cancer of upper outer quadrant of left female breast Left breast invasive ductal carcinoma T1 cN0 M0 stage IA grade 1, ER 100%, PR 39%, HER-2 negative, Ki-67 8%, Oncotype DX recurrence score 13, it was not a lot, status post radiation therapy and currently on anastrozole started November 2013  Anastrozole toxicities: 1. Musculoskeletal aches and pains: Patient had marked swelling of the right side of her body.These symptoms have improved gradually over time. She now continues to have some arthritis changes but there are not unreasonable. She still plays tennis and racquetball. 2. Occasional hot flashes  Patient might choose to stay on anastrozole beyond 5 years. We may consider sending breast cancer index next yr.  Breast cancer surveillance: 1. Mammograms 10/22/2014 normal Breast density category B 2. Breast exam 10/15/2015  No palpable lumps or nodules in the breast or axilla. Her husband is not very physically active but she is very active in tennis and racket ball and is also planning to buy a camper to go hiking and trails. Lung nodule: Resolved based on CT scan done in July 2015  Return to clinic in  6 months for follow-up   No orders of the defined types were placed in this encounter.   The patient has a good understanding of the overall plan. she agrees with it. she will call with any problems that may develop before the next visit here.   Rulon Eisenmenger, MD 10/15/2015

## 2015-10-15 NOTE — Telephone Encounter (Signed)
appt made and avs printed °

## 2015-11-05 ENCOUNTER — Ambulatory Visit
Admission: RE | Admit: 2015-11-05 | Discharge: 2015-11-05 | Disposition: A | Discharge status: Home / Self Care | Payer: 59 | Source: Ambulatory Visit | Attending: Hematology and Oncology | Admitting: Hematology and Oncology

## 2015-11-05 DIAGNOSIS — Z853 Personal history of malignant neoplasm of breast: Secondary | ICD-10-CM

## 2016-02-04 ENCOUNTER — Telehealth: Payer: Self-pay | Admitting: *Deleted

## 2016-02-04 ENCOUNTER — Telehealth: Payer: Self-pay

## 2016-02-04 NOTE — Telephone Encounter (Signed)
"  I need to know if the insurance policy was received and the status of this.  Form was faxed to Doctor on 01-30-2016 to [id number]."   No form notes located at this time.  Call transferred ext [phone] at 12:35 pm.

## 2016-02-04 NOTE — Telephone Encounter (Signed)
Called pt back regarding question on status of insurance policy that she had tried to fax over. Pt states that she received a letter from her insurance company that they will be getting a hold of Dr.Gudena to find out about more information. Asked what insurance company it was, and she states that she does not remember if it was health insurance or life insurance. Pt to call back tomorrow with more information. Will follow up with pt.

## 2016-02-05 ENCOUNTER — Telehealth: Payer: Self-pay

## 2016-02-05 NOTE — Telephone Encounter (Addendum)
FYI "My husband applied for life insurance for me.  The [employer] vendor is [employer].  They called and need my records.  They need pathology, TNM grade, Procedures and details of related treatment, (radiation).  I notified them of my breast cancer is why.  EMS phone number is [phone], or [phone] and  They're open from 8:0 am - 6:00 pm.".  Will notify staff.  Noted today a claim form was faxed to [employer] by staff responsible for Managed Care Coverage.

## 2016-02-05 NOTE — Telephone Encounter (Signed)
Faxed claim form to [employer]/ [employer]

## 2016-02-05 NOTE — Telephone Encounter (Signed)
Spoke with Jonelle Sidle, RN who stated forms had been completed and faxed as requested.  Called pt to let her know this has been done.  Pt verbalized appreciation and understanding and is without further questions or concerns at time of call.

## 2016-05-06 DIAGNOSIS — R000 Tachycardia, unspecified: Secondary | ICD-10-CM | POA: Diagnosis not present

## 2016-05-06 DIAGNOSIS — I493 Ventricular premature depolarization: Secondary | ICD-10-CM | POA: Diagnosis not present

## 2016-05-06 DIAGNOSIS — R002 Palpitations: Secondary | ICD-10-CM | POA: Diagnosis not present

## 2016-07-28 DIAGNOSIS — Z0000 Encounter for general adult medical examination without abnormal findings: Secondary | ICD-10-CM | POA: Diagnosis not present

## 2016-08-02 DIAGNOSIS — M1990 Unspecified osteoarthritis, unspecified site: Secondary | ICD-10-CM | POA: Diagnosis not present

## 2016-08-02 DIAGNOSIS — Z0000 Encounter for general adult medical examination without abnormal findings: Secondary | ICD-10-CM | POA: Diagnosis not present

## 2016-08-02 DIAGNOSIS — R7309 Other abnormal glucose: Secondary | ICD-10-CM | POA: Diagnosis not present

## 2016-08-02 DIAGNOSIS — Z1389 Encounter for screening for other disorder: Secondary | ICD-10-CM | POA: Diagnosis not present

## 2016-08-02 DIAGNOSIS — L408 Other psoriasis: Secondary | ICD-10-CM | POA: Diagnosis not present

## 2016-08-03 DIAGNOSIS — Z1212 Encounter for screening for malignant neoplasm of rectum: Secondary | ICD-10-CM | POA: Diagnosis not present

## 2016-09-03 DIAGNOSIS — L400 Psoriasis vulgaris: Secondary | ICD-10-CM | POA: Diagnosis not present

## 2016-09-03 DIAGNOSIS — B078 Other viral warts: Secondary | ICD-10-CM | POA: Diagnosis not present

## 2016-09-03 DIAGNOSIS — B351 Tinea unguium: Secondary | ICD-10-CM | POA: Diagnosis not present

## 2016-09-27 ENCOUNTER — Other Ambulatory Visit: Payer: Self-pay | Admitting: Hematology and Oncology

## 2016-09-27 DIAGNOSIS — Z853 Personal history of malignant neoplasm of breast: Secondary | ICD-10-CM

## 2016-10-08 DIAGNOSIS — B078 Other viral warts: Secondary | ICD-10-CM | POA: Diagnosis not present

## 2016-10-13 ENCOUNTER — Other Ambulatory Visit: Payer: Self-pay | Admitting: Emergency Medicine

## 2016-10-13 DIAGNOSIS — C50412 Malignant neoplasm of upper-outer quadrant of left female breast: Secondary | ICD-10-CM

## 2016-10-14 ENCOUNTER — Encounter: Payer: Self-pay | Admitting: Hematology and Oncology

## 2016-10-14 ENCOUNTER — Other Ambulatory Visit (HOSPITAL_BASED_OUTPATIENT_CLINIC_OR_DEPARTMENT_OTHER): Payer: 59

## 2016-10-14 ENCOUNTER — Ambulatory Visit (HOSPITAL_BASED_OUTPATIENT_CLINIC_OR_DEPARTMENT_OTHER): Payer: 59 | Admitting: Hematology and Oncology

## 2016-10-14 DIAGNOSIS — C50412 Malignant neoplasm of upper-outer quadrant of left female breast: Secondary | ICD-10-CM | POA: Diagnosis not present

## 2016-10-14 DIAGNOSIS — Z170 Estrogen receptor positive status [ER+]: Secondary | ICD-10-CM

## 2016-10-14 DIAGNOSIS — Z79811 Long term (current) use of aromatase inhibitors: Secondary | ICD-10-CM | POA: Diagnosis not present

## 2016-10-14 LAB — CBC WITH DIFFERENTIAL/PLATELET
BASO%: 1.3 % (ref 0.0–2.0)
Basophils Absolute: 0.1 10*3/uL (ref 0.0–0.1)
EOS ABS: 0.2 10*3/uL (ref 0.0–0.5)
EOS%: 4 % (ref 0.0–7.0)
HEMATOCRIT: 39.2 % (ref 34.8–46.6)
HGB: 13.1 g/dL (ref 11.6–15.9)
LYMPH#: 1 10*3/uL (ref 0.9–3.3)
LYMPH%: 21.5 % (ref 14.0–49.7)
MCH: 31.2 pg (ref 25.1–34.0)
MCHC: 33.4 g/dL (ref 31.5–36.0)
MCV: 93.4 fL (ref 79.5–101.0)
MONO#: 0.4 10*3/uL (ref 0.1–0.9)
MONO%: 8.8 % (ref 0.0–14.0)
NEUT%: 64.4 % (ref 38.4–76.8)
NEUTROS ABS: 2.9 10*3/uL (ref 1.5–6.5)
PLATELETS: 247 10*3/uL (ref 145–400)
RBC: 4.19 10*6/uL (ref 3.70–5.45)
RDW: 12.5 % (ref 11.2–14.5)
WBC: 4.5 10*3/uL (ref 3.9–10.3)

## 2016-10-14 LAB — COMPREHENSIVE METABOLIC PANEL
ALK PHOS: 44 U/L (ref 40–150)
ALT: 14 U/L (ref 0–55)
ANION GAP: 8 meq/L (ref 3–11)
AST: 19 U/L (ref 5–34)
Albumin: 3.8 g/dL (ref 3.5–5.0)
BILIRUBIN TOTAL: 0.53 mg/dL (ref 0.20–1.20)
BUN: 16.9 mg/dL (ref 7.0–26.0)
CALCIUM: 9.4 mg/dL (ref 8.4–10.4)
CO2: 28 mEq/L (ref 22–29)
CREATININE: 0.9 mg/dL (ref 0.6–1.1)
Chloride: 106 mEq/L (ref 98–109)
EGFR: 72 mL/min/{1.73_m2} — AB (ref >90)
Glucose: 102 mg/dl (ref 70–140)
Potassium: 4.6 mEq/L (ref 3.5–5.1)
Sodium: 142 mEq/L (ref 136–145)
TOTAL PROTEIN: 6.9 g/dL (ref 6.4–8.3)

## 2016-10-14 MED ORDER — ANASTROZOLE 1 MG PO TABS: 1.0000 mg | ORAL_TABLET | Freq: Every day | ORAL | 3 refills | Status: DC

## 2016-10-14 NOTE — Assessment & Plan Note (Signed)
Left breast invasive ductal carcinoma T1 cN0 M0 stage IA grade 1, ER 100%, PR 39%, HER-2 negative, Ki-67 8%, Oncotype DX recurrence score 13, it was not a lot, status post radiation therapy and currently on anastrozole started November 2013  Anastrozole toxicities: 1. Musculoskeletal aches and pains:  She still plays tennis and racquetball. 2. Occasional hot flashes  I discussed the data showing that there was no difference between 7-10 years of antiestrogen therapy. Patient is interested in staying on extended adjuvant therapy.   Breast cancer surveillance: 1. Mammograms scheduled for 11/05/2016  2. Breast exam 10/14/2016  No palpable lumps or nodules in the breast or axilla.  Her husband is not very physically active but she is very active in tennis and racket ball and is also planning to buy a camper to go hiking and trails.  Lung nodule: Resolved based on CT scan done in July 2015  Return to clinic in 1 year for follow-up

## 2016-10-14 NOTE — Progress Notes (Signed)
Patient Care Team: Shon Baton, MD as PCP - General (Internal Medicine) Gus Height, MD as Consulting Physician (Obstetrics and Gynecology) Shon Baton, MD as Consulting Physician (Internal Medicine) Thea Silversmith, MD (Inactive) as Consulting Physician (Radiation Oncology) Eston Esters, MD (Inactive) as Consulting Physician (Hematology and Oncology)  DIAGNOSIS:  Encounter Diagnosis  Name Primary?  . Primary cancer of upper outer quadrant of left female breast ([place])     SUMMARY OF ONCOLOGIC HISTORY:   Primary cancer of upper outer quadrant of left female breast ([place])   11/01/2011 Surgery    Left lumpectomy T1 cN0 M0 stage IA invasive ductal carcinoma grade 1 ER 100%, PR 39%, HER-2 negative, Ki-67 8%, Oncotype DX score 13, 8% ROR      01/11/2012 - 01/27/2012 Radiation Therapy    Adjuvant radiation therapy      02/03/2012 -  Anti-estrogen oral therapy    Anastrozole 1 mg daily       CHIEF COMPLIANT: Follow-up on anastrozole therapy  INTERVAL HISTORY: Jennifer Walker is a 61 year old with above-mentioned history of left breast cancer treated with lumpectomy radiation is currently on anastrozole therapy for the past 4/2 years. She's been tolerating it extremely well. She stays extremely active by playing tennis. She is even gotten her husband and son to play tennis. She denies any lumps or nodules in the breasts.  REVIEW OF SYSTEMS:   Constitutional: Denies fevers, chills or abnormal weight loss Eyes: Denies blurriness of vision Ears, nose, mouth, throat, and face: Denies mucositis or sore throat Respiratory: Denies cough, dyspnea or wheezes Cardiovascular: Denies palpitation, chest discomfort Gastrointestinal:  Denies nausea, heartburn or change in bowel habits Skin: Denies abnormal skin rashes Lymphatics: Denies new lymphadenopathy or easy bruising Neurological:Denies numbness, tingling or new weaknesses Behavioral/Psych: Mood is stable, no new changes  Extremities:  No lower extremity edema Breast:  denies any pain or lumps or nodules in either breasts All other systems were reviewed with the patient and are negative.  I have reviewed the past medical history, past surgical history, social history and family history with the patient and they are unchanged from previous note.  ALLERGIES:  is allergic to sulfa antibiotics; sulfa drugs cross reactors; and tape.  MEDICATIONS:  Current Outpatient Prescriptions  Medication Sig Dispense Refill  . anastrozole (ARIMIDEX) 1 MG tablet TAKE 1 TABLET DAILY 90 tablet 4  . calcipotriene-betamethasone (TACLONEX) ointment Apply 1 application topically daily as needed. For psoriasis    . clobetasol (TEMOVATE) 0.05 % external solution Apply 1 application topically daily as needed. For psoriasis    . Glucosamine-Chondroit-Vit C-Mn (GLUCOSAMINE 1500 COMPLEX PO) Take 2 tablets by mouth daily.    . Multiple Vitamins-Minerals (MULTIVITAMINS THER. W/MINERALS) TABS Take 1 tablet by mouth daily.    . nitrofurantoin, macrocrystal-monohydrate, (MACROBID) 100 MG capsule Take 100 mg by mouth daily as needed. Takes when pt has sex    . Nutritional Supplements (MELATONIN PO) Take 1-2 tablets by mouth at bedtime as needed. For sleep    . zolpidem (AMBIEN) 10 MG tablet Take 10 mg by mouth at bedtime as needed. For sleep     No current facility-administered medications for this visit.     PHYSICAL EXAMINATION: ECOG PERFORMANCE STATUS: 0 - Asymptomatic  Vitals:   10/14/16 1036  BP: 119/73  Pulse: (!) 56  Resp: 18  Temp: 98 F (36.7 C)   Filed Weights   10/14/16 1036  Weight: 200 lb 4.8 oz (90.9 kg)    GENERAL:alert, no distress and comfortable  SKIN: skin color, texture, turgor are normal, no rashes or significant lesions EYES: normal, Conjunctiva are pink and non-injected, sclera clear OROPHARYNX:no exudate, no erythema and lips, buccal mucosa, and tongue normal  NECK: supple, thyroid normal size, non-tender, without  nodularity LYMPH:  no palpable lymphadenopathy in the cervical, axillary or inguinal LUNGS: clear to auscultation and percussion with normal breathing effort HEART: regular rate & rhythm and no murmurs and no lower extremity edema ABDOMEN:abdomen soft, non-tender and normal bowel sounds MUSCULOSKELETAL:no cyanosis of digits and no clubbing  NEURO: alert & oriented x 3 with fluent speech, no focal motor/sensory deficits EXTREMITIES: No lower extremity edema BREAST: No palpable masses or nodules in either right or left breasts. No palpable axillary supraclavicular or infraclavicular adenopathy no breast tenderness or nipple discharge. (exam performed in the presence of a chaperone)  LABORATORY DATA:  I have reviewed the data as listed   Chemistry      Component Value Date/Time   NA 140 10/15/2015 0953   K 4.7 10/15/2015 0953   CL 102 05/02/2012 1028   CO2 28 10/15/2015 0953   BUN 18.5 10/15/2015 0953   CREATININE 1.0 10/15/2015 0953      Component Value Date/Time   CALCIUM 9.7 10/15/2015 0953   ALKPHOS 57 10/15/2015 0953   AST 23 10/15/2015 0953   ALT 17 10/15/2015 0953   BILITOT 0.69 10/15/2015 0953       Lab Results  Component Value Date   WBC 4.5 10/14/2016   HGB 13.1 10/14/2016   HCT 39.2 10/14/2016   MCV 93.4 10/14/2016   PLT 247 10/14/2016   NEUTROABS 2.9 10/14/2016    ASSESSMENT & PLAN:  Primary cancer of upper outer quadrant of left female breast Left breast invasive ductal carcinoma T1 cN0 M0 stage IA grade 1, ER 100%, PR 39%, HER-2 negative, Ki-67 8%, Oncotype DX recurrence score 13, it was not a lot, status post radiation therapy and currently on anastrozole started November 2013  Anastrozole toxicities: 1. Musculoskeletal aches and pains:  She still plays tennis and racquetball. 2. Occasional hot flashes  I discussed the data showing that there was no difference between 7-10 years of antiestrogen therapy. Patient is interested in staying on extended  adjuvant therapy. We will obtain breast cancer index to determine if she would benefit from extended adjuvant therapy.  Breast cancer surveillance: 1. Mammograms scheduled for 11/05/2016  2. Breast exam 10/14/2016  No palpable lumps or nodules in the breast or axilla.  Her husband is not very physically active but she is very active in tennis and racket ball and is also planning to buy a camper to go hiking and trails.  Lung nodule: Resolved based on CT scan done in July 2015  Return to clinic in 1 year for follow-up   I spent 25 minutes talking to the patient of which more than half was spent in counseling and coordination of care.  No orders of the defined types were placed in this encounter.  The patient has a good understanding of the overall plan. she agrees with it. she will call with any problems that may develop before the next visit here.   Rulon Eisenmenger, MD 10/14/16

## 2016-10-15 ENCOUNTER — Other Ambulatory Visit: Payer: Self-pay | Admitting: Hematology and Oncology

## 2016-10-26 ENCOUNTER — Ambulatory Visit (INDEPENDENT_AMBULATORY_CARE_PROVIDER_SITE_OTHER): Payer: 59

## 2016-10-26 ENCOUNTER — Encounter: Payer: Self-pay | Admitting: Podiatry

## 2016-10-26 ENCOUNTER — Ambulatory Visit (INDEPENDENT_AMBULATORY_CARE_PROVIDER_SITE_OTHER): Payer: 59 | Admitting: Podiatry

## 2016-10-26 ENCOUNTER — Ambulatory Visit (INDEPENDENT_AMBULATORY_CARE_PROVIDER_SITE_OTHER): Payer: 59 | Admitting: Orthotics

## 2016-10-26 DIAGNOSIS — M7750 Other enthesopathy of unspecified foot: Secondary | ICD-10-CM

## 2016-10-26 DIAGNOSIS — M76821 Posterior tibial tendinitis, right leg: Secondary | ICD-10-CM

## 2016-10-26 DIAGNOSIS — M2140 Flat foot [pes planus] (acquired), unspecified foot: Secondary | ICD-10-CM

## 2016-10-26 DIAGNOSIS — M7751 Other enthesopathy of right foot: Secondary | ICD-10-CM | POA: Diagnosis not present

## 2016-10-26 DIAGNOSIS — M779 Enthesopathy, unspecified: Secondary | ICD-10-CM | POA: Diagnosis not present

## 2016-10-26 DIAGNOSIS — M6789 Other specified disorders of synovium and tendon, multiple sites: Secondary | ICD-10-CM

## 2016-10-26 DIAGNOSIS — M76829 Posterior tibial tendinitis, unspecified leg: Secondary | ICD-10-CM

## 2016-10-26 NOTE — Progress Notes (Signed)
Patient came in to be cast for F/O.Marland Kitchen Patient present with pes cavus foot, hx of arch pain, pf.  Patient want full length F/o to go in tennis playing shoes.Marland Kitchen

## 2016-10-27 DIAGNOSIS — C50412 Malignant neoplasm of upper-outer quadrant of left female breast: Secondary | ICD-10-CM | POA: Diagnosis not present

## 2016-10-27 NOTE — Progress Notes (Signed)
She presents today states that she feels that she may need new orthotics she started to develop some knee and ankle pain and feels that another orthotic may be the answer.  Objective: Vital signs are stable alert and oriented 3. Pulses are palpable. She has minimal pain on palpation of the posterior tibial tendon which was painful for her last time. She does have some pain around the ankle with some fluid collection. Radiographs do not demonstrate any type of abnormality in this area other than some early osteoarthritic change anteriorly.  Assessment: History of posterior tibial tendinitis mild pes planus.  Plan: She was casted for new set of orthotics.

## 2016-10-28 ENCOUNTER — Encounter (HOSPITAL_COMMUNITY): Payer: Self-pay

## 2016-10-28 ENCOUNTER — Telehealth: Payer: Self-pay | Admitting: Hematology and Oncology

## 2016-10-28 NOTE — Telephone Encounter (Signed)
Call the patient to tell her that breast cancer index came back as low risk at 3.1% late recurrence. She also has low likelihood of benefit from extended adjuvant therapy with antiestrogen treatments. He will stop  Antiestrogen therapy in November 2018

## 2016-11-05 ENCOUNTER — Ambulatory Visit
Admission: RE | Admit: 2016-11-05 | Discharge: 2016-11-05 | Disposition: A | Discharge status: Home / Self Care | Payer: 59 | Source: Ambulatory Visit | Attending: Hematology and Oncology | Admitting: Hematology and Oncology

## 2016-11-05 DIAGNOSIS — Z853 Personal history of malignant neoplasm of breast: Secondary | ICD-10-CM

## 2016-11-05 DIAGNOSIS — R928 Other abnormal and inconclusive findings on diagnostic imaging of breast: Secondary | ICD-10-CM | POA: Diagnosis not present

## 2016-11-05 HISTORY — DX: Personal history of irradiation: Z92.3

## 2016-11-16 ENCOUNTER — Ambulatory Visit: Payer: 59 | Admitting: Orthotics

## 2016-11-16 DIAGNOSIS — M76821 Posterior tibial tendinitis, right leg: Secondary | ICD-10-CM

## 2016-11-16 DIAGNOSIS — M76829 Posterior tibial tendinitis, unspecified leg: Secondary | ICD-10-CM

## 2016-11-16 DIAGNOSIS — M779 Enthesopathy, unspecified: Secondary | ICD-10-CM

## 2016-11-16 NOTE — Progress Notes (Signed)
Patient came in today to pick up custom made foot orthotics.  The goals were accomplished and the patient reported no dissatisfaction with said orthotics.  Patient was advised of breakin period and how to report any issues. 

## 2016-12-17 ENCOUNTER — Encounter: Payer: Self-pay | Admitting: Hematology and Oncology

## 2016-12-17 NOTE — Progress Notes (Signed)
[facility] requested medical records on 12/14/2016, faxed 12/14/2016, confirmation received

## 2017-07-11 ENCOUNTER — Encounter: Payer: Self-pay | Admitting: Internal Medicine

## 2017-08-08 DIAGNOSIS — R946 Abnormal results of thyroid function studies: Secondary | ICD-10-CM | POA: Diagnosis not present

## 2017-08-08 DIAGNOSIS — R7309 Other abnormal glucose: Secondary | ICD-10-CM | POA: Diagnosis not present

## 2017-08-08 DIAGNOSIS — Z0000 Encounter for general adult medical examination without abnormal findings: Secondary | ICD-10-CM | POA: Diagnosis not present

## 2017-08-08 DIAGNOSIS — R82998 Other abnormal findings in urine: Secondary | ICD-10-CM | POA: Diagnosis not present

## 2017-08-15 DIAGNOSIS — D72819 Decreased white blood cell count, unspecified: Secondary | ICD-10-CM | POA: Diagnosis not present

## 2017-08-15 DIAGNOSIS — Z1389 Encounter for screening for other disorder: Secondary | ICD-10-CM | POA: Diagnosis not present

## 2017-08-15 DIAGNOSIS — M1990 Unspecified osteoarthritis, unspecified site: Secondary | ICD-10-CM | POA: Diagnosis not present

## 2017-08-15 DIAGNOSIS — R7309 Other abnormal glucose: Secondary | ICD-10-CM | POA: Diagnosis not present

## 2017-08-16 ENCOUNTER — Encounter: Payer: Self-pay | Admitting: Internal Medicine

## 2017-08-30 ENCOUNTER — Telehealth: Payer: Self-pay | Admitting: Hematology and Oncology

## 2017-08-30 NOTE — Telephone Encounter (Signed)
Called pt, not able to leave message.  VG PAL, rescheduled appt from 7/22 to 7/30 @ 11 am.  Mailed calendar.

## 2017-09-07 ENCOUNTER — Other Ambulatory Visit: Payer: Self-pay

## 2017-09-07 ENCOUNTER — Ambulatory Visit (AMBULATORY_SURGERY_CENTER): Payer: Self-pay

## 2017-09-07 DIAGNOSIS — Z86010 Personal history of colonic polyps: Secondary | ICD-10-CM

## 2017-09-07 MED ORDER — NA SULFATE-K SULFATE-MG SULF 17.5-3.13-1.6 GM/177ML PO SOLN: 1.0000 | Freq: Once | ORAL | 0 refills | Status: AC

## 2017-09-07 NOTE — Progress Notes (Signed)
Denies allergies to eggs or soy products. Denies complication of anesthesia or sedation. Denies use of weight loss medication. Denies use of O2.   Emmi instructions declined.  

## 2017-09-08 ENCOUNTER — Encounter: Payer: Self-pay | Admitting: Internal Medicine

## 2017-09-14 ENCOUNTER — Encounter: Payer: 59 | Admitting: Internal Medicine

## 2017-09-21 ENCOUNTER — Ambulatory Visit (AMBULATORY_SURGERY_CENTER): Payer: 59 | Admitting: Internal Medicine

## 2017-09-21 ENCOUNTER — Other Ambulatory Visit: Payer: Self-pay

## 2017-09-21 ENCOUNTER — Encounter: Payer: Self-pay | Admitting: Internal Medicine

## 2017-09-21 VITALS — BP 123/56 | HR 78 | Temp 98.7°F | Resp 17 | Ht 71.0 in | Wt 200.0 lb

## 2017-09-21 DIAGNOSIS — Z86010 Personal history of colonic polyps: Secondary | ICD-10-CM | POA: Diagnosis present

## 2017-09-21 MED ORDER — SODIUM CHLORIDE 0.9 % IV SOLN: 500.0000 mL | Freq: Once | INTRAVENOUS | Status: DC

## 2017-09-21 NOTE — Op Note (Signed)
[place] Patient Name: Jennifer Walker Procedure Date: 09/21/2017 2:05 PM MRN: [id number] Endoscopist: Jerene Bears , MD Age: 62 Referring MD:  Date of Birth: 1956/01/10 Gender: Female Account #: [id number] Procedure:                Colonoscopy Indications:              High risk colon cancer surveillance: Personal                            history of sessile serrated colon polyp (less than                            10 mm in size) with no dysplasia, Last colonoscopy                            5 years ago Medicines:                Monitored Anesthesia Care Procedure:                Pre-Anesthesia Assessment:                           - Prior to the procedure, a History and Physical                            was performed, and patient medications and                            allergies were reviewed. The patient's tolerance of                            previous anesthesia was also reviewed. The risks                            and benefits of the procedure and the sedation                            options and risks were discussed with the patient.                            All questions were answered, and informed consent                            was obtained. Prior Anticoagulants: The patient has                            taken no previous anticoagulant or antiplatelet                            agents. ASA Grade Assessment: II - A patient with                            mild systemic disease. After reviewing the risks  and benefits, the patient was deemed in                            satisfactory condition to undergo the procedure.                           After obtaining informed consent, the colonoscope                            was passed under direct vision. Throughout the                            procedure, the patient's blood pressure, pulse, and                            oxygen saturations were monitored continuously.  The                            Model PCF-[id number]) scope was introduced                            through the anus and advanced to the cecum,                            identified by appendiceal orifice and ileocecal                            valve. The colonoscopy was performed without                            difficulty. The patient tolerated the procedure                            well. The quality of the bowel preparation was                            good. The ileocecal valve, appendiceal orifice, and                            rectum were photographed. Scope In: 2:09:56 PM Scope Out: 2:27:48 PM Scope Withdrawal Time: 0 hours 11 minutes 24 seconds  Total Procedure Duration: 0 hours 17 minutes 52 seconds  Findings:                 The perianal and digital rectal examinations were                            normal.                           The entire examined colon appeared normal on direct                            and retroflexion views. Complications:            No immediate complications. Estimated  Blood Loss:     Estimated blood loss: none. Impression:               - The entire examined colon is normal on direct and                            retroflexion views.                           - No specimens collected. Recommendation:           - Patient has a contact number available for                            emergencies. The signs and symptoms of potential                            delayed complications were discussed with the                            patient. Return to normal activities tomorrow.                            Written discharge instructions were provided to the                            patient.                           - Resume previous diet.                           - Continue present medications.                           - Repeat colonoscopy in 10 years for screening                            purposes. Jerene Bears, MD 09/21/2017  2:30:02 PM This report has been signed electronically.

## 2017-09-21 NOTE — Progress Notes (Signed)
Report given to PACU, vss 

## 2017-09-21 NOTE — Patient Instructions (Signed)
  Repeat colonoscopy in 10 years  YOU HAD AN ENDOSCOPIC PROCEDURE TODAY AT [facility]:   Refer to the procedure report that was given to you for any specific questions about what was found during the examination.  If the procedure report does not answer your questions, please call your gastroenterologist to clarify.  If you requested that your care partner not be given the details of your procedure findings, then the procedure report has been included in a sealed envelope for you to review at your convenience later.  YOU SHOULD EXPECT: Some feelings of bloating in the abdomen. Passage of more gas than usual.  Walking can help get rid of the air that was put into your GI tract during the procedure and reduce the bloating. If you had a lower endoscopy (such as a colonoscopy or flexible sigmoidoscopy) you may notice spotting of blood in your stool or on the toilet paper. If you underwent a bowel prep for your procedure, you may not have a normal bowel movement for a few days.  Please Note:  You might notice some irritation and congestion in your nose or some drainage.  This is from the oxygen used during your procedure.  There is no need for concern and it should clear up in a day or so.  SYMPTOMS TO REPORT IMMEDIATELY:   Following lower endoscopy (colonoscopy or flexible sigmoidoscopy):  Excessive amounts of blood in the stool  Significant tenderness or worsening of abdominal pains  Swelling of the abdomen that is new, acute  Fever of 100F or higher    For urgent or emergent issues, a gastroenterologist can be reached at any hour by calling [phone].   DIET:  We do recommend a small meal at first, but then you may proceed to your regular diet.  Drink plenty of fluids but you should avoid alcoholic beverages for 24 hours.  ACTIVITY:  You should plan to take it easy for the rest of today and you should NOT DRIVE or use heavy machinery until tomorrow (because of the  sedation medicines used during the test).    FOLLOW UP: Our staff will call the number listed on your records the next business day following your procedure to check on you and address any questions or concerns that you may have regarding the information given to you following your procedure. If we do not reach you, we will leave a message.  However, if you are feeling well and you are not experiencing any problems, there is no need to return our call.  We will assume that you have returned to your regular daily activities without incident.  If any biopsies were taken you will be contacted by phone or by letter within the next 1-3 weeks.  Please call us at [phone] if you have not heard about the biopsies in 3 weeks.    SIGNATURES/CONFIDENTIALITY: You and/or your care partner have signed paperwork which will be entered into your electronic medical record.  These signatures attest to the fact that that the information above on your After Visit Summary has been reviewed and is understood.  Full responsibility of the confidentiality of this discharge information lies with you and/or your care-partner.

## 2017-09-22 ENCOUNTER — Telehealth: Payer: Self-pay | Admitting: *Deleted

## 2017-09-22 NOTE — Telephone Encounter (Signed)
  Follow up Call-  Call back number 09/21/2017  Post procedure Call Back phone  # [phone]  Permission to leave phone message Yes  Some recent data might be hidden     Patient questions:  Do you have a fever, pain , or abdominal swelling? No. Pain Score  0 *  Have you tolerated food without any problems? Yes.    Have you been able to return to your normal activities? Yes.    Do you have any questions about your discharge instructions: Diet   No. Medications  No. Follow up visit  No.  Do you have questions or concerns about your Care? No.  Actions: * If pain score is 4 or above: No action needed, pain <4.

## 2017-09-23 ENCOUNTER — Other Ambulatory Visit: Payer: Self-pay | Admitting: Hematology and Oncology

## 2017-09-23 DIAGNOSIS — Z1231 Encounter for screening mammogram for malignant neoplasm of breast: Secondary | ICD-10-CM

## 2017-09-27 ENCOUNTER — Other Ambulatory Visit (HOSPITAL_COMMUNITY)
Admission: RE | Admit: 2017-09-27 | Discharge: 2017-09-27 | Disposition: A | Discharge status: Home / Self Care | Payer: 59 | Source: Ambulatory Visit | Attending: Obstetrics & Gynecology | Admitting: Obstetrics & Gynecology

## 2017-09-27 ENCOUNTER — Other Ambulatory Visit: Payer: Self-pay

## 2017-09-27 ENCOUNTER — Ambulatory Visit: Payer: 59 | Admitting: Certified Nurse Midwife

## 2017-09-27 ENCOUNTER — Encounter: Payer: Self-pay | Admitting: Certified Nurse Midwife

## 2017-09-27 ENCOUNTER — Encounter: Payer: 59 | Admitting: Certified Nurse Midwife

## 2017-09-27 VITALS — BP 118/70 | HR 70 | Resp 16 | Ht 69.5 in | Wt 215.0 lb

## 2017-09-27 DIAGNOSIS — N390 Urinary tract infection, site not specified: Secondary | ICD-10-CM | POA: Insufficient documentation

## 2017-09-27 DIAGNOSIS — Z124 Encounter for screening for malignant neoplasm of cervix: Secondary | ICD-10-CM

## 2017-09-27 DIAGNOSIS — Z01419 Encounter for gynecological examination (general) (routine) without abnormal findings: Secondary | ICD-10-CM

## 2017-09-27 DIAGNOSIS — Z780 Asymptomatic menopausal state: Secondary | ICD-10-CM

## 2017-09-27 DIAGNOSIS — C50919 Malignant neoplasm of unspecified site of unspecified female breast: Secondary | ICD-10-CM | POA: Insufficient documentation

## 2017-09-27 DIAGNOSIS — N952 Postmenopausal atrophic vaginitis: Secondary | ICD-10-CM

## 2017-09-27 DIAGNOSIS — R10A1 Flank pain, right side: Secondary | ICD-10-CM | POA: Insufficient documentation

## 2017-09-27 DIAGNOSIS — Z853 Personal history of malignant neoplasm of breast: Secondary | ICD-10-CM

## 2017-09-27 DIAGNOSIS — R109 Unspecified abdominal pain: Secondary | ICD-10-CM | POA: Insufficient documentation

## 2017-09-27 NOTE — Patient Instructions (Addendum)
EXERCISE AND DIET:  We recommended that you start or continue a regular exercise program for good health. Regular exercise means any activity that makes your heart beat faster and makes you sweat.  We recommend exercising at least 30 minutes per day at least 3 days a week, preferably 4 or 5.  We also recommend a diet low in fat and sugar.  Inactivity, poor dietary choices and obesity can cause diabetes, heart attack, stroke, and kidney damage, among others.    ALCOHOL AND SMOKING:  Women should limit their alcohol intake to no more than 7 drinks/beers/glasses of wine (combined, not each!) per week. Moderation of alcohol intake to this level decreases your risk of breast cancer and liver damage. And of course, no recreational drugs are part of a healthy lifestyle.  And absolutely no smoking or even second hand smoke. Most people know smoking can cause heart and lung diseases, but did you know it also contributes to weakening of your bones? Aging of your skin?  Yellowing of your teeth and nails?  CALCIUM AND VITAMIN D:  Adequate intake of calcium and Vitamin D are recommended.  The recommendations for exact amounts of these supplements seem to change often, but generally speaking 600 mg of calcium (either carbonate or citrate) and 800 units of Vitamin D per day seems prudent. Certain women may benefit from higher intake of Vitamin D.  If you are among these women, your doctor will have told you during your visit.    PAP SMEARS:  Pap smears, to check for cervical cancer or precancers,  have traditionally been done yearly, although recent scientific advances have shown that most women can have pap smears less often.  However, every woman still should have a physical exam from her gynecologist every year. It will include a breast check, inspection of the vulva and vagina to check for abnormal growths or skin changes, a visual exam of the cervix, and then an exam to evaluate the size and shape of the uterus and  ovaries.  And after 60 years of age, a rectal exam is indicated to check for rectal cancers. We will also provide age appropriate advice regarding health maintenance, like when you should have certain vaccines, screening for sexually transmitted diseases, bone density testing, colonoscopy, mammograms, etc.   MAMMOGRAMS:  All women over 40 years old should have a yearly mammogram. Many facilities now offer a "3D" mammogram, which may cost around $50 extra out of pocket. If possible,  we recommend you accept the option to have the 3D mammogram performed.  It both reduces the number of women who will be called back for extra views which then turn out to be normal, and it is better than the routine mammogram at detecting truly abnormal areas.    COLONOSCOPY:  Colonoscopy to screen for colon cancer is recommended for all women at age 50.  We know, you hate the idea of the prep.  We agree, BUT, having colon cancer and not knowing it is worse!!  Colon cancer so often starts as a polyp that can be seen and removed at colonscopy, which can quite literally save your life!  And if your first colonoscopy is normal and you have no family history of colon cancer, most women don't have to have it again for 10 years.  Once every ten years, you can do something that may end up saving your life, right?  We will be happy to help you get it scheduled when you are ready.    Be sure to check your insurance coverage so you understand how much it will cost.  It may be covered as a preventative service at no cost, but you should check your particular policy.      Atrophic Vaginitis Atrophic vaginitis is when the tissues that line the vagina become dry and thin. This is caused by a drop in estrogen. Estrogen helps:  To keep the vagina moist.  To make a clear fluid that helps: ? To lubricate the vagina for sex. ? To protect the vagina from infection.  If the lining of the vagina is dry and thin, it may:  Make sex painful. It  may also cause bleeding.  Cause a feeling of: ? Burning. ? Irritation. ? Itchiness.  Make an exam of your vagina painful. It may also cause bleeding.  Make you lose interest in sex.  Cause a burning feeling when you pee.  Make your vaginal fluid (discharge) brown or yellow.  For some women, there are no symptoms. This condition is most common in women who do not get their regular menstrual periods anymore (menopause). This often starts when a woman is 45-55 years old. Follow these instructions at home:  Take medicines only as told by your doctor. Do not use any herbal or alternative medicines unless your doctor says it is okay.  Use over-the-counter products for dryness only as told by your doctor. These include: ? Creams. ? Lubricants. ? Moisturizers.  Do not douche.  Do not use products that can make your vagina dry. These include: ? Scented feminine sprays. ? Scented tampons. ? Scented soaps.  If it hurts to have sex, tell your sexual partner. Contact a doctor if:  Your discharge looks different than normal.  Your vagina has an unusual smell.  You have new symptoms.  Your symptoms do not get better with treatment.  Your symptoms get worse. This information is not intended to replace advice given to you by your health care provider. Make sure you discuss any questions you have with your health care provider. Document Released: 09/01/2007 Document Revised: 08/21/2015 Document Reviewed: 03/06/2014 Elsevier Interactive Patient Education  2018 [employer]vier Inc.  

## 2017-09-27 NOTE — Progress Notes (Signed)
62 y.o. [id number] Married  Caucasian Fe here to  establish gyn care and  for annual exam. Last exam was 2013. Denies vaginal bleeding. Has vaginal dryness and has used different type of options and has not found anything that works for her. Menopausal no HRT. Previous use Arimidex and stopped 10/18 with some hot flashes, and some weight gain. Adjusting now and working on weight loss. Insomnia much better with decrease night sweats.  She is not on any medication. Occasional stress incontinence, not any issue, "waits to long to go". History of psoriasis, no flares at this point. Has height loss with last BMD in 2013, aware another is due.Plays tennis several times a week and has noted some nodules on feet, has Podiatrist and will plan to see him for evaluation. Sees Dr Virgina Jock yearly for aex and labs. No other health issues today.  Patient's last menstrual period was 03/29/2009.          Sexually active: No.  The current method of family planning is vasectomy.    Exercising: Yes.    tennis, elliptical Smoker:  no  Health Maintenance: Pap:  18yr ago 2015 neg History of Abnormal Pap: yes, just a repeat yrs ago MMG:  11-05-16 category b density birads 2:neg Self Breast exams: yes Colonoscopy:  2019 f/u 153yrBMD:   2013 with pcp TDaP:  Will check with pcp Shingles: not done Pneumonia: not done Hep C and HIV: donates blood Labs: no   reports that she has never smoked. She has never used smokeless tobacco. She reports that she drinks about 3.0 oz of alcohol per week. She reports that she does not use drugs.  Past Medical History:  Diagnosis Date  . Arthritis   . [place]   Invasive ductal ca grade 1 er/pr positive, her2  negative  . Fatigue   . H/O colonoscopy 2003  . Hot flashes   . Insomnia   . Personal history of radiation therapy   . PONV (postoperative nausea and vomiting)   . Psoriasis   . Wears glasses     Past Surgical History:  Procedure Laterality Date  . BREAST  LUMPECTOMY Left 11/01/2011   left breast lumpectomy  . BREAST SURGERY  10/1976   right breast benign tumor  . DILATION AND CURETTAGE OF UTERUS    . KNEE SURGERY  2005, 2007   bilateral   . LYMPH GLAND EXCISION  1978   right axillary  . [place] POL[place]    . TUMOR REMOVAL  1975   right breast    Current Outpatient Medications  Medication Sig Dispense Refill  . calcipotriene-betamethasone (TACLONEX) ointment Apply 1 application topically daily as needed. For psoriasis    . Glucosamine-Chondroit-Vit C-Mn (GLUCOSAMINE 1500 COMPLEX PO) Take 2 tablets by mouth daily.    . Multiple Vitamins-Minerals (MULTIVITAMINS THER. W/MINERALS) TABS Take 1 tablet by mouth daily.    . Nutritional Supplements (MELATONIN PO) Take 1-2 tablets by mouth at bedtime. For sleep      Current Facility-Administered Medications  Medication Dose Route Frequency Provider Last Rate Last Dose  . 0.9 %  sodium chloride infusion  500 mL Intravenous Once Pyrtle, JaLajuan LinesMD        Family History  Problem Relation Age of Onset  . Breast cancer Mother [id number]     Inflammatory breast cancer  . Heart attack Father   . Tongue cancer Brother     ROS:  Pertinent  items are noted in HPI.  Otherwise, a comprehensive ROS was negative.  Exam:   BP 118/70   Pulse 70   Resp 16   Ht 5' 9.5" (1.765 m)   Wt 215 lb (97.5 kg)   LMP 03/29/2009   BMI 31.29 kg/m  Height: 5' 9.5" (176.5 cm) Ht Readings from Last 3 Encounters:  09/27/17 5' 9.5" (1.765 m)  09/21/17 5' 11"  (1.803 m)  10/14/16 5' 11"  (1.803 m)    General appearance: alert, cooperative and appears stated age Head: Normocephalic, without obvious abnormality, atraumatic Neck: no adenopathy, supple, symmetrical, trachea midline and thyroid normal to inspection and palpation Lungs: clear to auscultation bilaterally Breasts: normal appearance, no masses or tenderness, No nipple retraction or dimpling, No nipple discharge or bleeding, No axillary or  supraclavicular adenopathy, lumpectomy scarring noted on left breast Heart: regular rate and rhythm Abdomen: soft, non-tender; no masses,  no organomegaly Extremities: extremities normal, atraumatic, no cyanosis or edema Skin: Skin color, texture, turgor normal. No rashes or lesions Lymph nodes: Cervical, supraclavicular, and axillary nodes normal. No abnormal inguinal nodes palpated Neurologic: Grossly normal   Pelvic: External genitalia:  no lesions              Urethra:  normal appearing urethra with no masses, tenderness or lesions              Bartholin's and Skene's: normal                 Vagina: normal appearing vagina with normal color and discharge, no lesions              Cervix: multiparous appearance, no bleeding following Pap, no cervical motion tenderness, no lesions and normal appearance              Pap taken: Yes.   Bimanual Exam:  Uterus:  normal size, contour, position, consistency, mobility, non-tender and anteverted              Adnexa: normal adnexa and no mass, fullness, tenderness               Rectovaginal: Confirms               Anus:  normal sphincter tone, no lesions  Chaperone present: yes  A:  Well Woman with normal exam  Menopausal no HRT  History of left breast cancer  Atrophic vaginitis  Insomnia history Melatonin works well  BMD and mammogram due    P:   Reviewed health and wellness pertinent to exam  Discussed importance of notifying if vaginal bleeding.   Aware mammogram due and need to follow up due to history of breast cancer. Patient has number to schedule.  Discussed atrophic vaginitis and discomfort that this can cause. Discussed OTC option of coconut oil or Olive oil at hs to increase moisture and comfort level. Questions addressed.  Discussed BMD due and patient will schedule. Order placed  Pap smear: yes   counseled on breast self exam, mammography screening, feminine hygiene, adequate intake of calcium and vitamin D, diet and  exercise  return annually or prn  An After Visit Summary was printed and given to the patient.

## 2017-09-28 LAB — CYTOLOGY - PAP: Diagnosis: NEGATIVE

## 2017-09-28 NOTE — Progress Notes (Signed)
Error in system

## 2017-10-17 ENCOUNTER — Ambulatory Visit: Payer: 59 | Admitting: Hematology and Oncology

## 2017-10-18 ENCOUNTER — Ambulatory Visit (INDEPENDENT_AMBULATORY_CARE_PROVIDER_SITE_OTHER): Payer: 59

## 2017-10-18 ENCOUNTER — Ambulatory Visit: Payer: 59 | Admitting: Podiatry

## 2017-10-18 ENCOUNTER — Encounter: Payer: Self-pay | Admitting: Podiatry

## 2017-10-18 DIAGNOSIS — M67472 Ganglion, left ankle and foot: Secondary | ICD-10-CM

## 2017-10-18 NOTE — Progress Notes (Signed)
She presents today complaining of 2 small nodules on the lateral aspect of her left foot.  She states that started out with 1 and was quite tender and then it has spread to once that happened and is no longer tender.  Objective: Vital signs stable she is alert and oriented x3.  Pulses are palpable.  She has 2 nonpulsatile nodular masses one just distal to the sinus tarsi the other superior to the fifth metatarsal.  There appeared to be fluid-filled there is nonpulsatile they appear to be ganglia.  Radiographs today do not demonstrate any type of intralesional ossifications.  Assessment: Ganglion cyst left foot.  Plan: Offered her decompression or aspiration she declined follow-up with her as needed.

## 2017-10-25 ENCOUNTER — Inpatient Hospital Stay: Payer: 59 | Attending: Hematology and Oncology | Admitting: Hematology and Oncology

## 2017-10-25 ENCOUNTER — Telehealth: Payer: Self-pay | Admitting: Hematology and Oncology

## 2017-10-25 DIAGNOSIS — Z853 Personal history of malignant neoplasm of breast: Secondary | ICD-10-CM | POA: Insufficient documentation

## 2017-10-25 DIAGNOSIS — C50412 Malignant neoplasm of upper-outer quadrant of left female breast: Secondary | ICD-10-CM

## 2017-10-25 DIAGNOSIS — Z923 Personal history of irradiation: Secondary | ICD-10-CM | POA: Insufficient documentation

## 2017-10-25 NOTE — Telephone Encounter (Signed)
Per 7/30 los.  Gave patient avs and calendar.

## 2017-10-25 NOTE — Assessment & Plan Note (Signed)
Left breast invasive ductal carcinoma T1 cN0 M0 stage IA grade 1, ER 100%, PR 39%, HER-2 negative, Ki-67 8%, Oncotype DX recurrence score 13, it was not a lot, status post radiation therapy and currently on anastrozole started November 2013 completed November 2018  Breast cancer index: Low risk of late distant recurrence.  No benefit from extended adjuvant therapy  Breast cancer surveillance: 1. Mammograms scheduled for 11/05/2016  2. Breast exam 10/14/2016 No palpable lumps or nodules in the breast or axilla.  Her husband is not very physically active but she is very active in tennis and racket ball and is also planning to buy a camper to go hiking and trails.  Lung nodule: Resolved based on CT scan done in July 2015  Return to clinic in 1 year for follow-up with long-term survivorship     

## 2017-10-25 NOTE — Progress Notes (Signed)
Patient Care Team: Shon Baton, MD as PCP - General (Internal Medicine) Gus Height, MD as Consulting Physician (Obstetrics and Gynecology) Shon Baton, MD as Consulting Physician (Internal Medicine) Thea Silversmith, MD as Consulting Physician (Radiation Oncology) Eston Esters, MD (Inactive) as Consulting Physician (Hematology and Oncology)  DIAGNOSIS:  Encounter Diagnosis  Name Primary?  . Primary cancer of upper outer quadrant of left female breast ([place])     SUMMARY OF ONCOLOGIC HISTORY:   Primary cancer of upper outer quadrant of left female breast ([place])   11/01/2011 Surgery    Left lumpectomy T1 cN0 M0 stage IA invasive ductal carcinoma grade 1 ER 100%, PR 39%, HER-2 negative, Ki-67 8%, Oncotype DX score 13, 8% ROR      01/11/2012 - 01/27/2012 Radiation Therapy    Adjuvant radiation therapy      02/03/2012 - 02/22/2017 Anti-estrogen oral therapy    Anastrozole 1 mg daily       CHIEF COMPLIANT: Follow-up after completion of antiestrogen therapy  INTERVAL HISTORY: Jennifer Walker is a 62 year old with above-mentioned history of left breast cancer who underwent lumpectomy radiation and completed 5 years of antiestrogen therapy she had breast cancer index which was low risk and hence she stopped it after 5 years.  She reports that after she stopped the medication she had a lot of muscle aches and pains and weight gain issues.  She is currently trying to lose the weight again.  REVIEW OF SYSTEMS:   Constitutional: Denies fevers, chills or abnormal weight loss Eyes: Denies blurriness of vision Ears, nose, mouth, throat, and face: Denies mucositis or sore throat Respiratory: Denies cough, dyspnea or wheezes Cardiovascular: Denies palpitation, chest discomfort Gastrointestinal:  Denies nausea, heartburn or change in bowel habits Skin: Denies abnormal skin rashes Lymphatics: Denies new lymphadenopathy or easy bruising Neurological:Denies numbness, tingling or new  weaknesses Behavioral/Psych: Mood is stable, no new changes  Extremities: No lower extremity edema Breast:  denies any pain or lumps or nodules in either breasts All other systems were reviewed with the patient and are negative.  I have reviewed the past medical history, past surgical history, social history and family history with the patient and they are unchanged from previous note.  ALLERGIES:  is allergic to sulfa antibiotics; sulfa drugs cross reactors; and tape.  MEDICATIONS:  Current Outpatient Medications  Medication Sig Dispense Refill  . calcipotriene-betamethasone (TACLONEX) ointment Apply 1 application topically daily as needed. For psoriasis    . Glucosamine-Chondroit-Vit C-Mn (GLUCOSAMINE 1500 COMPLEX PO) Take 2 tablets by mouth daily.    . Multiple Vitamins-Minerals (MULTIVITAMINS THER. W/MINERALS) TABS Take 1 tablet by mouth daily.    . Nutritional Supplements (MELATONIN PO) Take 1-2 tablets by mouth at bedtime. For sleep      Current Facility-Administered Medications  Medication Dose Route Frequency Provider Last Rate Last Dose  . 0.9 %  sodium chloride infusion  500 mL Intravenous Once Pyrtle, Lajuan Lines, MD        PHYSICAL EXAMINATION: ECOG PERFORMANCE STATUS: 0 - Asymptomatic  Vitals:   10/25/17 1123  BP: 115/68  Pulse: 62  Resp: 18  Temp: 98.3 F (36.8 C)  SpO2: 98%   Filed Weights   10/25/17 1123  Weight: 215 lb 9.6 oz (97.8 kg)    GENERAL:alert, no distress and comfortable SKIN: skin color, texture, turgor are normal, no rashes or significant lesions EYES: normal, Conjunctiva are pink and non-injected, sclera clear OROPHARYNX:no exudate, no erythema and lips, buccal mucosa, and tongue normal  NECK: supple,  thyroid normal size, non-tender, without nodularity LYMPH:  no palpable lymphadenopathy in the cervical, axillary or inguinal LUNGS: clear to auscultation and percussion with normal breathing effort HEART: regular rate & rhythm and no murmurs and no  lower extremity edema ABDOMEN:abdomen soft, non-tender and normal bowel sounds MUSCULOSKELETAL:no cyanosis of digits and no clubbing  NEURO: alert & oriented x 3 with fluent speech, no focal motor/sensory deficits EXTREMITIES: No lower extremity edema BREAST: No palpable masses or nodules in either right or left breasts. No palpable axillary supraclavicular or infraclavicular adenopathy no breast tenderness or nipple discharge. (exam performed in the presence of a chaperone)  LABORATORY DATA:  I have reviewed the data as listed CMP Latest Ref Rng & Units 10/14/2016 10/15/2015 04/07/2015  Glucose 70 - 140 mg/dl 102 109 106  BUN 7.0 - 26.0 mg/dL 16.9 18.5 20.3  Creatinine 0.6 - 1.1 mg/dL 0.9 1.0 0.9  Sodium 136 - 145 mEq/L 142 140 139  Potassium 3.5 - 5.1 mEq/L 4.6 4.7 4.4  Chloride 98 - 107 mEq/L - - -  CO2 22 - 29 mEq/L 28 28 27   Calcium 8.4 - 10.4 mg/dL 9.4 9.7 9.2  Total Protein 6.4 - 8.3 g/dL 6.9 7.8 7.3  Total Bilirubin 0.20 - 1.20 mg/dL 0.53 0.69 0.44  Alkaline Phos 40 - 150 U/L 44 57 52  AST 5 - 34 U/L 19 23 18   ALT 0 - 55 U/L 14 17 14     Lab Results  Component Value Date   WBC 4.5 10/14/2016   HGB 13.1 10/14/2016   HCT 39.2 10/14/2016   MCV 93.4 10/14/2016   PLT 247 10/14/2016   NEUTROABS 2.9 10/14/2016    ASSESSMENT & PLAN:  Primary cancer of upper outer quadrant of left female breast Left breast invasive ductal carcinoma T1 cN0 M0 stage IA grade 1, ER 100%, PR 39%, HER-2 negative, Ki-67 8%, Oncotype DX recurrence score 13, it was not a lot, status post radiation therapy and currently on anastrozole started November 2013 completed November 2018  Breast cancer index: Low risk of late distant recurrence.  No benefit from extended adjuvant therapy  Breast cancer surveillance: 1. Mammograms scheduled for 11/05/2016  2. Breast exam 10/14/2016 No palpable lumps or nodules in the breast or axilla.  Her husband is not very physically active but she is very active in tennis  and racket ball and is also planning to buy a camper to go hiking and trails.  Lung nodule: Resolved based on CT scan done in July 2015  Return to clinic in 1 year for follow-up with long-term survivorship  No orders of the defined types were placed in this encounter.  The patient has a good understanding of the overall plan. she agrees with it. she will call with any problems that may develop before the next visit here.   Harriette Ohara, MD 10/25/17

## 2017-11-10 ENCOUNTER — Ambulatory Visit
Admission: RE | Admit: 2017-11-10 | Discharge: 2017-11-10 | Disposition: A | Discharge status: Home / Self Care | Payer: 59 | Source: Ambulatory Visit | Attending: Hematology and Oncology | Admitting: Hematology and Oncology

## 2017-11-10 DIAGNOSIS — Z1231 Encounter for screening mammogram for malignant neoplasm of breast: Secondary | ICD-10-CM | POA: Diagnosis not present

## 2017-12-21 ENCOUNTER — Ambulatory Visit
Admission: RE | Admit: 2017-12-21 | Discharge: 2017-12-21 | Disposition: A | Discharge status: Home / Self Care | Payer: 59 | Source: Ambulatory Visit | Attending: Certified Nurse Midwife | Admitting: Certified Nurse Midwife

## 2017-12-21 DIAGNOSIS — M85851 Other specified disorders of bone density and structure, right thigh: Secondary | ICD-10-CM | POA: Diagnosis not present

## 2017-12-21 DIAGNOSIS — Z780 Asymptomatic menopausal state: Secondary | ICD-10-CM

## 2018-08-15 DIAGNOSIS — Z0000 Encounter for general adult medical examination without abnormal findings: Secondary | ICD-10-CM | POA: Diagnosis not present

## 2018-10-03 ENCOUNTER — Other Ambulatory Visit: Payer: Self-pay

## 2018-10-04 ENCOUNTER — Other Ambulatory Visit: Payer: Self-pay

## 2018-10-04 ENCOUNTER — Encounter: Payer: Self-pay | Admitting: Certified Nurse Midwife

## 2018-10-04 ENCOUNTER — Ambulatory Visit: Payer: 59 | Admitting: Certified Nurse Midwife

## 2018-10-04 ENCOUNTER — Other Ambulatory Visit (HOSPITAL_COMMUNITY)
Admission: RE | Admit: 2018-10-04 | Discharge: 2018-10-04 | Disposition: A | Discharge status: Home / Self Care | Payer: 59 | Source: Ambulatory Visit | Attending: Certified Nurse Midwife | Admitting: Certified Nurse Midwife

## 2018-10-04 VITALS — BP 102/72 | HR 68 | Temp 97.2°F | Resp 16 | Ht 69.75 in | Wt 176.0 lb

## 2018-10-04 DIAGNOSIS — Z01419 Encounter for gynecological examination (general) (routine) without abnormal findings: Secondary | ICD-10-CM | POA: Diagnosis not present

## 2018-10-04 DIAGNOSIS — Z124 Encounter for screening for malignant neoplasm of cervix: Secondary | ICD-10-CM | POA: Insufficient documentation

## 2018-10-04 DIAGNOSIS — N952 Postmenopausal atrophic vaginitis: Secondary | ICD-10-CM | POA: Diagnosis not present

## 2018-10-04 NOTE — Progress Notes (Signed)
63 y.o. [id number] Married  Caucasian Fe here for annual exam. Menopausal no HRT.Still having vaginal dryness and have decided to not be sexually active vaginal, but can be together in other ways. Has tried all options. Sees PCP for cholesterol management and labs. Staying active with tennis!  Patient's last menstrual period was 03/29/2009.          Sexually active: No.  The current method of family planning is vasectomy.    Exercising: Yes.    walking & tennis Smoker:  no  Review of Systems  Constitutional:       Joint pain  HENT: Negative.   Eyes: Negative.   Respiratory: Negative.   Cardiovascular: Negative.   Gastrointestinal: Negative.   Genitourinary: Negative.   Musculoskeletal: Negative.   Skin: Negative.   Neurological: Negative.   Endo/Heme/Allergies: Negative.   Psychiatric/Behavioral: Negative.     Health Maintenance: Pap:  09-27-17 neg History of Abnormal Pap: yes MMG:  11-10-17 category b density birads 1:neg Self Breast exams: occ Colonoscopy:  2019 f/u 89yr BMD:   2019 TDaP: maybe done with pcp Shingles: not done Pneumonia: not done Hep C and HIV: donates blood Labs: PCP   reports that she has never smoked. She has never used smokeless tobacco. She reports current alcohol use of about 5.0 standard drinks of alcohol per week. She reports that she does not use drugs.  Past Medical History:  Diagnosis Date  . Abnormal Pap smear of cervix    yrs ago, just a repeat done  . Arthritis   . Breast cancer ([place]    Invasive ductal ca grade 1 er/pr positive, her2  negative  . Fatigue   . H/O colonoscopy 2003  . Hot flashes   . Insomnia   . Personal history of radiation therapy   . PONV (postoperative nausea and vomiting)   . Psoriasis   . Wears glasses     Past Surgical History:  Procedure Laterality Date  . BREAST LUMPECTOMY Left 11/01/2011   left breast lumpectomy  . BREAST SURGERY  10/1976   right breast benign tumor  . DILATION AND CURETTAGE OF UTERUS     . KNEE SURGERY  2005, 2007   bilateral   . LYMPH GLAND EXCISION  1978   right axillary  . [place] POLYPECTOMY    . TUMOR REMOVAL  1975   right breast    Current Outpatient Medications  Medication Sig Dispense Refill  . calcipotriene-betamethasone (TACLONEX) ointment Apply 1 application topically daily as needed. For psoriasis    . Glucosamine-Chondroit-Vit C-Mn (GLUCOSAMINE 1500 COMPLEX PO) Take 2 tablets by mouth daily.    . Multiple Vitamins-Minerals (MULTIVITAMINS THER. W/MINERALS) TABS Take 1 tablet by mouth daily.    . Nutritional Supplements (MELATONIN PO) Take 1-2 tablets by mouth at bedtime. For sleep     . UNABLE TO FIND cbd oil     No current facility-administered medications for this visit.     Family History  Problem Relation Age of Onset  . Breast cancer Mother 569      Inflammatory breast cancer  . Heart attack Father   . Tongue cancer Brother     ROS:  Pertinent items are noted in HPI.  Otherwise, a comprehensive ROS was negative.  Exam:   BP 102/72   Pulse 68   Temp (!) 97.2 F (36.2 C) (Skin)   Resp 16   Ht 5' 9.75" (1.772 m)   Wt 176 lb (79.8  kg)   LMP 03/29/2009   BMI 25.43 kg/m  Height: 5' 9.75" (177.2 cm) Ht Readings from Last 3 Encounters:  10/04/18 5' 9.75" (1.772 m)  10/25/17 5' 9.5" (1.765 m)  09/27/17 5' 9.5" (1.765 m)    General appearance: alert, cooperative and appears stated age Head: Normocephalic, without obvious abnormality, atraumatic Neck: no adenopathy, supple, symmetrical, trachea midline and thyroid normal to inspection and palpation Lungs: clear to auscultation bilaterally Breasts: normal appearance, no masses or tenderness, No nipple retraction or dimpling, No nipple discharge or bleeding, No axillary or supraclavicular adenopathy Heart: regular rate and rhythm Abdomen: soft, non-tender; no masses,  no organomegaly Extremities: extremities normal, atraumatic, no cyanosis or edema Skin: Skin color,  texture, turgor normal. No rashes or lesions Lymph nodes: Cervical, supraclavicular, and axillary nodes normal. No abnormal inguinal nodes palpated Neurologic: Grossly normal   Pelvic: External genitalia:  no lesions              Urethra:  normal appearing urethra with no masses, tenderness or lesions              Bartholin's and Skene's: normal                 Vagina: normal appearing vagina with normal color and discharge, no lesions              Cervix: no cervical motion tenderness, no lesions and normal appearance              Pap taken: Yes.   Bimanual Exam:  Uterus:  normal size, contour, position, consistency, mobility, non-tender and anteverted              Adnexa: normal adnexa and no mass, fullness, tenderness               Rectovaginal: Confirms               Anus:  normal sphincter tone, no lesions  Chaperone present: yes  A:  Well Woman with normal exam  Post menopausal  No HRT  History of left breast cancer, no treatment now  Atrophic vaginitis prefers no other treatment options    P:   Reviewed health and wellness pertinent to exam  Aware of need to advise if vaginal bleeding  Continue follow up as needed  Will advise if need assistance  Pap smear: yes   counseled on breast self exam, mammography screening, adequate intake of calcium and vitamin D, diet and exercise, Kegel's exercises  return annually or prn  An After Visit Summary was printed and given to the patient.

## 2018-10-06 LAB — CYTOLOGY - PAP
Diagnosis: NEGATIVE
HPV: NOT DETECTED

## 2018-10-10 ENCOUNTER — Other Ambulatory Visit: Payer: Self-pay | Admitting: Hematology and Oncology

## 2018-10-10 DIAGNOSIS — Z1231 Encounter for screening mammogram for malignant neoplasm of breast: Secondary | ICD-10-CM

## 2018-10-31 ENCOUNTER — Telehealth: Payer: Self-pay | Admitting: Adult Health

## 2018-10-31 ENCOUNTER — Inpatient Hospital Stay: Payer: 59 | Attending: Adult Health | Admitting: Adult Health

## 2018-10-31 ENCOUNTER — Encounter: Payer: Self-pay | Admitting: Adult Health

## 2018-10-31 ENCOUNTER — Other Ambulatory Visit: Payer: Self-pay

## 2018-10-31 ENCOUNTER — Telehealth: Payer: Self-pay

## 2018-10-31 VITALS — BP 106/70 | HR 57 | Temp 98.7°F | Resp 18 | Ht 69.75 in | Wt 175.9 lb

## 2018-10-31 DIAGNOSIS — Z8249 Family history of ischemic heart disease and other diseases of the circulatory system: Secondary | ICD-10-CM | POA: Diagnosis not present

## 2018-10-31 DIAGNOSIS — Z923 Personal history of irradiation: Secondary | ICD-10-CM | POA: Diagnosis not present

## 2018-10-31 DIAGNOSIS — Z803 Family history of malignant neoplasm of breast: Secondary | ICD-10-CM | POA: Diagnosis not present

## 2018-10-31 DIAGNOSIS — Z79899 Other long term (current) drug therapy: Secondary | ICD-10-CM | POA: Diagnosis not present

## 2018-10-31 DIAGNOSIS — R634 Abnormal weight loss: Secondary | ICD-10-CM | POA: Insufficient documentation

## 2018-10-31 DIAGNOSIS — Z882 Allergy status to sulfonamides status: Secondary | ICD-10-CM | POA: Diagnosis not present

## 2018-10-31 DIAGNOSIS — C50412 Malignant neoplasm of upper-outer quadrant of left female breast: Secondary | ICD-10-CM

## 2018-10-31 DIAGNOSIS — N6310 Unspecified lump in the right breast, unspecified quadrant: Secondary | ICD-10-CM | POA: Insufficient documentation

## 2018-10-31 DIAGNOSIS — M1990 Unspecified osteoarthritis, unspecified site: Secondary | ICD-10-CM | POA: Insufficient documentation

## 2018-10-31 DIAGNOSIS — N63 Unspecified lump in unspecified breast: Secondary | ICD-10-CM | POA: Diagnosis not present

## 2018-10-31 DIAGNOSIS — Z808 Family history of malignant neoplasm of other organs or systems: Secondary | ICD-10-CM | POA: Insufficient documentation

## 2018-10-31 DIAGNOSIS — Z79811 Long term (current) use of aromatase inhibitors: Secondary | ICD-10-CM | POA: Diagnosis not present

## 2018-10-31 DIAGNOSIS — Z7289 Other problems related to lifestyle: Secondary | ICD-10-CM | POA: Diagnosis not present

## 2018-10-31 DIAGNOSIS — Z170 Estrogen receptor positive status [ER+]: Secondary | ICD-10-CM | POA: Insufficient documentation

## 2018-10-31 NOTE — Progress Notes (Signed)
CLINIC:  Survivorship   REASON FOR VISIT:  Routine follow-up for history of breast cancer.   BRIEF ONCOLOGIC HISTORY:  Oncology History  Primary cancer of upper outer quadrant of left female breast ([place])  11/01/2011 Surgery   Left lumpectomy T1 cN0 M0 stage IA invasive ductal carcinoma grade 1 ER 100%, PR 39%, HER-2 negative, Ki-67 8%, Oncotype DX score 13, 8% ROR   01/11/2012 - 01/27/2012 Radiation Therapy   Adjuvant radiation therapy   02/03/2012 - 02/22/2017 Anti-estrogen oral therapy   Anastrozole 1 mg daily      INTERVAL HISTORY:  Ms. Okelly presents to the [facility] today for routine follow-up for her history of breast cancer.  Overall, she reports feeling quite well. She has lost 40 pounds intentionally this year.  She did this due to increased arthritis.  This is much improved with her weight loss and CBD oil.  She is following a keto diet and walks her dog about 2 miles a day.  She is also dancing and playing tennis.  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  She is practicing appropriate precautions.    She has been following up with her PCP regularly.  She is up to date with her cancer screenings.  She is planning on following up with dermatology soon.   REVIEW OF SYSTEMS:  Review of Systems  Constitutional: Negative for appetite change, chills, fatigue, fever and unexpected weight change.  HENT:   Negative for hearing loss, lump/mass, sore throat and trouble swallowing.   Eyes: Negative for eye problems and icterus.  Respiratory: Negative for chest tightness, cough and shortness of breath.   Cardiovascular: Negative for chest pain, leg swelling and palpitations.  Gastrointestinal: Negative for abdominal distention, abdominal pain, constipation, diarrhea, nausea and vomiting.  Endocrine: Negative for hot flashes.  Genitourinary: Negative for difficulty urinating.   Musculoskeletal: Negative for arthralgias.  Skin: Negative for itching and rash.   Neurological: Negative for dizziness, extremity weakness, headaches and numbness.  Hematological: Negative for adenopathy. Does not bruise/bleed easily.  Psychiatric/Behavioral: Negative for depression. The patient is not nervous/anxious.    Breast: Denies any new nodularity, masses, tenderness, nipple changes, or nipple discharge.      PAST MEDICAL/SURGICAL HISTORY:  Past Medical History:  Diagnosis Date  . Abnormal Pap smear of cervix    yrs ago, just a repeat done  . Arthritis   . Breast cancer ([place])    Invasive ductal ca grade 1 er/pr positive, her2  negative  . Fatigue   . H/O colonoscopy 2003  . Hot flashes   . Insomnia   . Personal history of radiation therapy   . PONV (postoperative nausea and vomiting)   . Psoriasis   . Wears glasses    Past Surgical History:  Procedure Laterality Date  . BREAST LUMPECTOMY Left 11/01/2011   left breast lumpectomy  . BREAST SURGERY  10/1976   right breast benign tumor  . DILATION AND CURETTAGE OF UTERUS    . KNEE SURGERY  2005, 2007   bilateral   . LYMPH GLAND EXCISION  1978   right axillary  . [place]  . POLYPECTOMY    . TUMOR REMOVAL  1975   right breast     ALLERGIES:  Allergies  Allergen Reactions  . Sulfa Antibiotics Other (See Comments)    Yeast infections and ulcers  . Sulfa Drugs Cross Reactors   . Tape Rash     CURRENT MEDICATIONS:  Outpatient Encounter Medications as of  10/31/2018  Medication Sig  . calcipotriene-betamethasone (TACLONEX) ointment Apply 1 application topically daily as needed. For psoriasis  . Glucosamine-Chondroit-Vit C-Mn (GLUCOSAMINE 1500 COMPLEX PO) Take 2 tablets by mouth daily.  . Multiple Vitamins-Minerals (MULTIVITAMINS THER. W/MINERALS) TABS Take 1 tablet by mouth daily.  . Nutritional Supplements (MELATONIN PO) Take 1-2 tablets by mouth at bedtime. For sleep   . UNABLE TO FIND cbd oil   No facility-administered encounter medications on file as of 10/31/2018.       ONCOLOGIC FAMILY HISTORY:  Family History  Problem Relation Age of Onset  . Breast cancer Mother 22       Inflammatory breast cancer  . Heart attack Father   . Tongue cancer Brother     GENETIC COUNSELING/TESTING: Reviewed with Roma Kayser.    SOCIAL HISTORY:  Social History   Socioeconomic History  . Marital status: Married    Spouse name: Not on file  . Number of children: Not on file  . Years of education: Not on file  . Highest education level: Not on file  Occupational History  . Not on file  Social Needs  . Financial resource strain: Not on file  . Food insecurity    Worry: Not on file    Inability: Not on file  . Transportation needs    Medical: Not on file    Non-medical: Not on file  Tobacco Use  . Smoking status: Never Smoker  . Smokeless tobacco: Never Used  Substance and Sexual Activity  . Alcohol use: Yes    Alcohol/week: 5.0 standard drinks    Types: 5 Standard drinks or equivalent per week  . Drug use: No  . Sexual activity: Not Currently    Partners: Male    Birth control/protection: Other-see comments    Comment: husband vasectomy  Lifestyle  . Physical activity    Days per week: Not on file    Minutes per session: Not on file  . Stress: Not on file  Relationships  . Social Herbalist on phone: Not on file    Gets together: Not on file    Attends religious service: Not on file    Active member of club or organization: Not on file    Attends meetings of clubs or organizations: Not on file    Relationship status: Not on file  . Intimate partner violence    Fear of current or ex partner: Not on file    Emotionally abused: Not on file    Physically abused: Not on file    Forced sexual activity: Not on file  Other Topics Concern  . Not on file  Social History Narrative  . Not on file      PHYSICAL EXAMINATION:  Vital Signs: Vitals:   10/31/18 1112  BP: 106/70  Pulse: (!) 57  Resp: 18  Temp: 98.7 F (37.1 C)  SpO2:  100%   Filed Weights   10/31/18 1112  Weight: 175 lb 14.4 oz (79.8 kg)   General: Well-nourished, well-appearing female in no acute distress.  Unaccompanied today.   HEENT: Head is normocephalic.  Pupils equal and reactive to light. Conjunctivae clear without exudate.  Sclerae anicteric. Oral mucosa is pink, moist.  Oropharynx is pink without lesions or erythema.  Lymph: No cervical, supraclavicular, or infraclavicular lymphadenopathy noted on palpation.  Cardiovascular: Regular rate and rhythm.Marland Kitchen Respiratory: Clear to auscultation bilaterally. Chest expansion symmetric; breathing non-labored.  Breast Exam:  -Left breast: No appreciable masses on palpation. No  skin redness, thickening, or peau d'orange appearance; no nipple retraction or nipple discharge; mild distortion in symmetry at previous lumpectomy site healed scar without erythema or nodularity.  -Right breast: Small pea sized nodule at 10 oclock 4cmfn. + skin indentation at areola -Axilla: No axillary adenopathy bilaterally.  GI: Abdomen soft and round; non-tender, non-distended. Bowel sounds normoactive. No hepatosplenomegaly.   GU: Deferred.  Neuro: No focal deficits. Steady gait.  Psych: Mood and affect normal and appropriate for situation.  MSK: No focal spinal tenderness to palpation, full range of motion in bilateral upper extremities Extremities: No edema. Skin: Warm and dry.  LABORATORY DATA:  None for this visit   DIAGNOSTIC IMAGING:  Most recent mammogram: due     ASSESSMENT AND PLAN:  Ms.. Sachse is a pleasant 63 y.o. female with history of Stage IA left breast invasive ductal carcinoma, ER+/PR+/HER2-, diagnosed in 2013, treated with lumpectomy, adjuvant radiation therapy, and anti-estrogen therapy with Anastrozole x 5 years completing therapy in 01/2017.  [facility] for [facility] and routine follow-up.   1. History of breast cancer:  Ms. Durango is currently clinically and  radiographically without evidence of disease or recurrence of breast cancer. She is due for mammogram.  Due to her right breast nodule, I have ordered diagnostic mammogram and ultrasound of this area.  I encouraged her to call me with any questions or concerns before her next visit at the cancer center, and I would be happy to see her sooner, if needed.    2. History of genetic testing:  She has had genetic testing in the past.  It is unclear if this was recommended or not, and what type of panel she had done.  I have reached out to [facility] to determine whether or not she is eligible for updated testing.    3. Bone health:  Given Ms. Knotts's age, history of breast cancer, and her previous anti-estrogen therapy with Anastrozole, she is at risk for bone demineralization. Her last DEXA scan was in 11/2017 and was normal.  She will be due for a repeat in 2-5 years.  In the meantime, she was encouraged to increase her consumption of foods rich in calcium, as well as increase her weight-bearing activities.  She was given education on specific food and activities to promote bone health.  4. Cancer screening:  Due to Ms. Kreiter's history and her age, she should receive screening for skin cancers, colon cancer, and gynecologic cancers. She was encouraged to follow-up with her PCP for appropriate cancer screenings.   5. Health maintenance and wellness promotion: Ms. Turlington was encouraged to consume 5-7 servings of fruits and vegetables per day. She was also encouraged to engage in moderate to vigorous exercise for 30 minutes per day most days of the week. She was instructed to limit her alcohol consumption and continue to abstain from tobacco use.      Dispo:  -Return to cancer center in one year for LTS follow up -Mammogram    A total of (30) minutes of face-to-face time was spent with this patient with greater than 50% of that time in counseling and care-coordination.   Gardenia Phlegm, [place] [phone]   Note: PRIMARY CARE PROVIDER Shon Baton, [place] [phone]

## 2018-10-31 NOTE — Telephone Encounter (Signed)
I talk with patient regarding schedule  

## 2018-10-31 NOTE — Telephone Encounter (Signed)
-----   Message from Gardenia Phlegm, NP sent at 10/31/2018  2:43 PM EDT ----- Please relay the option for retesting below, for $250 if she is interested.  We can always have her meet with Santiago Glad if she wants more information. ----- Message ----- From: Clarene Essex, Counselor Sent: 10/31/2018   2:12 PM EDT To: Gardenia Phlegm, NP  YEs, it is covered at better rates.  I am not sure that she would meet criteria yet, which is why it was not covered well back in 2013/2014 time frame.    We could get her tested for $250, if she wants to do that. ----- Message ----- From: Gardenia Phlegm, NP Sent: 10/31/2018  11:30 AM EDT To: Clarene Essex, Counselor  Is genetic testing any better covered for patient?  She saw you in 2013.  I cannot find any results in system, and unsure if she is eligible for any updated testing.  Thanks,  Jennifer Walker

## 2018-10-31 NOTE — Telephone Encounter (Signed)
TC to Pt. To inform her that it would cost her 250 dollars now to do genetic testing. She stated that for that amount she would be interested. and would like more information. Schedule message sent to [facility] to schedule visit.

## 2018-11-01 ENCOUNTER — Telehealth: Payer: Self-pay | Admitting: Adult Health

## 2018-11-01 NOTE — Telephone Encounter (Signed)
I left a message regarding genectics

## 2018-11-08 ENCOUNTER — Telehealth: Payer: Self-pay | Admitting: Genetic Counselor

## 2018-11-08 NOTE — Telephone Encounter (Signed)
Returned patient's phone call regarding voicemail that was left, patient would prefer this to be a webex visit. E-mail has been sent.

## 2018-11-08 NOTE — Telephone Encounter (Signed)
Called patient regarding upcoming Webex appointment, left a voicemail. This will be a walk-in visit due to no confirmation for this to be a virtual visit.

## 2018-11-09 ENCOUNTER — Telehealth: Payer: Self-pay | Admitting: Genetic Counselor

## 2018-11-09 ENCOUNTER — Encounter: Payer: Self-pay | Admitting: Genetic Counselor

## 2018-11-09 ENCOUNTER — Inpatient Hospital Stay (HOSPITAL_BASED_OUTPATIENT_CLINIC_OR_DEPARTMENT_OTHER): Payer: 59 | Admitting: Genetic Counselor

## 2018-11-09 ENCOUNTER — Inpatient Hospital Stay: Payer: 59

## 2018-11-09 DIAGNOSIS — Z800 Family history of malignant neoplasm of digestive organs: Secondary | ICD-10-CM | POA: Insufficient documentation

## 2018-11-09 DIAGNOSIS — Z803 Family history of malignant neoplasm of breast: Secondary | ICD-10-CM

## 2018-11-09 DIAGNOSIS — Z853 Personal history of malignant neoplasm of breast: Secondary | ICD-10-CM | POA: Diagnosis not present

## 2018-11-09 DIAGNOSIS — Z7289 Other problems related to lifestyle: Secondary | ICD-10-CM

## 2018-11-09 DIAGNOSIS — Z801 Family history of malignant neoplasm of trachea, bronchus and lung: Secondary | ICD-10-CM | POA: Diagnosis not present

## 2018-11-09 DIAGNOSIS — Z8042 Family history of malignant neoplasm of prostate: Secondary | ICD-10-CM | POA: Insufficient documentation

## 2018-11-09 DIAGNOSIS — Z809 Family history of malignant neoplasm, unspecified: Secondary | ICD-10-CM

## 2018-11-09 NOTE — Telephone Encounter (Signed)
Called patient regarding concerns about the  Webex appointment, left a patient a voicemail and number to call back if there was any concerns with 08/13 appointment. Labs have been cancelled due to this being a virtual visit.

## 2018-11-09 NOTE — Progress Notes (Signed)
REFERRING PROVIDER: Gardenia Phlegm, NP [address] [place],  [place] [address]  PRIMARY PROVIDER:  Shon Baton, MD  PRIMARY REASON FOR VISIT:  1. History of breast cancer   2. Family history of cancer of mouth   3. Family history of prostate cancer   4. Family history of lung cancer     I connected with Jennifer Walker on 11/09/2018 at 10:00 am EDT by Webex video conference and verified that I am speaking with the correct person using two identifiers.   Patient location: home Provider location: clinic   HISTORY OF PRESENT ILLNESS:   Jennifer Walker, a 63 y.o. female, was seen for a [place] cancer genetics consultation at the request of Dr. Delice Bison due to a personal history of breast cancer and a family history of breast, prostate, mouth, and lung cancer.  Jennifer Walker presents to clinic today to discuss the possibility of a hereditary predisposition to cancer, genetic testing, and to further clarify her future cancer risks, as well as potential cancer risks for family members.   In 2013, at the age of 43, Jennifer Walker was diagnosed with IDC, ER+/PR+/Her2-, of the left breast. The treatment plan included lumpectomy, adjuvant radiation therapy, and anti-estrogen therapy.   CANCER HISTORY:  Oncology History  Primary cancer of upper outer quadrant of left female breast ([place])  11/01/2011 Surgery   Left lumpectomy T1 cN0 M0 stage IA invasive ductal carcinoma grade 1 ER 100%, PR 39%, HER-2 negative, Ki-67 8%, Oncotype DX score 13, 8% ROR   01/11/2012 - 01/27/2012 Radiation Therapy   Adjuvant radiation therapy   02/03/2012 - 02/22/2017 Anti-estrogen oral therapy   Anastrozole 1 mg daily      RISK FACTORS:  Menarche was at age 6.  First live birth at age 11.  OCP use for approximately 11 years.  Ovaries intact: yes.  Hysterectomy: no.  Menopausal status: postmenopausal, 52.  HRT use: 0 years. Colonoscopy: yes; 1 polyp 10 years ago. Mammogram within the last year:  yes. Number of breast biopsies: 5-6.   Past Medical History:  Diagnosis Date  . Abnormal Pap smear of cervix    yrs ago, just a repeat done  . Arthritis   . Breast cancer ([place])    Invasive ductal ca grade 1 er/pr positive, her2  negative  . Family history of cancer of mouth   . Family history of lung cancer   . Family history of prostate cancer   . Fatigue   . H/O colonoscopy 2003  . Hot flashes   . Insomnia   . Personal history of radiation therapy   . PONV (postoperative nausea and vomiting)   . Psoriasis   . Wears glasses     Past Surgical History:  Procedure Laterality Date  . BREAST LUMPECTOMY Left 11/01/2011   left breast lumpectomy  . BREAST SURGERY  10/1976   right breast benign tumor  . DILATION AND CURETTAGE OF UTERUS    . KNEE SURGERY  2005, 2007   bilateral   . LYMPH GLAND EXCISION  1978   right axillary  . [place]  . POLYPECTOMY    . TUMOR REMOVAL  1975   right breast    Social History   Socioeconomic History  . Marital status: Married    Spouse name: Not on file  . Number of children: Not on file  . Years of education: Not on file  . Highest education level: Not on file  Occupational History  .  Not on file  Social Needs  . Financial resource strain: Not on file  . Food insecurity    Worry: Not on file    Inability: Not on file  . Transportation needs    Medical: Not on file    Non-medical: Not on file  Tobacco Use  . Smoking status: Never Smoker  . Smokeless tobacco: Never Used  Substance and Sexual Activity  . Alcohol use: Yes    Alcohol/week: 5.0 standard drinks    Types: 5 Standard drinks or equivalent per week  . Drug use: No  . Sexual activity: Not Currently    Partners: Male    Birth control/protection: Other-see comments    Comment: husband vasectomy  Lifestyle  . Physical activity    Days per week: Not on file    Minutes per session: Not on file  . Stress: Not on file  Relationships  . Social Product manager on phone: Not on file    Gets together: Not on file    Attends religious service: Not on file    Active member of club or organization: Not on file    Attends meetings of clubs or organizations: Not on file    Relationship status: Not on file  Other Topics Concern  . Not on file  Social History Narrative  . Not on file     FAMILY HISTORY:  We obtained a detailed, 4-generation family history.  Significant diagnoses are listed below: Family History  Problem Relation Age of Onset  . Breast cancer Mother 47       Inflammatory breast cancer  . Heart attack Father   . Tongue cancer Brother 93  . Lung cancer Brother 34  . Prostate cancer Brother 72       not metastatic, treated with radiation  . Cancer Maternal Uncle        unknown cancer, diagnosed late 66s   Jennifer Walker two sons, age 24 and 41. She also has a sister and a brother. Her brother had mouth cancer diagnosed when he was 52, lung cancer diagnosed shortly after, and non-metastatic prostate cancer diagnosed when he was 39. Of note, her brother was a heavy smoker. Jennifer Walker also has a niece who has had health issues with her female organs and is undergoing increased screening for cancer.  Jennifer Walker mother was diagnosed with inflammatory breast cancer when she was 45, and died at 57. She has 3 maternal uncles, one of whom had an unknown cancer that he died from in his late 34s or early 37s. She also has 2 maternal half-aunts in their mid 80s and 36s who have not had cancer. Jennifer Walker maternal grandmother died in her 27s during WWII, and her maternal grandfather died when he was older due to heart problems.  Jennifer Walker father died at 57 from a heart attack. She had one paternal aunt and at least two paternal uncles. Her paternal grandmother died when she was older, and her paternal grandfather died when he was 35 due to a motorcycle accident. Jennifer Walker does not know of any individuals on her paternal side with  cancer, but she has limited information about this side of the family.  Jennifer Walker is unaware of previous family history of genetic testing for hereditary cancer risks. Patient's maternal ancestors are of [place] descent, and paternal ancestors are of [place] and [place] descent. There is no reported Ashkenazi Jewish ancestry. There is no known consanguinity.  GENETIC  COUNSELING ASSESSMENT: Jennifer Walker is a 63 y.o. female with a personal and family history of breast cancer and a family history of mouth, lung, and prostate cancer, which is somewhat suggestive of a hereditary cancer syndrome and predisposition to cancer. We, therefore, discussed and recommended the following at today's visit.   DISCUSSION: We discussed that 5 - 10% of breast cancer is hereditary, with most cases associated with BRCA1/2.  There are other genes that can be associated with hereditary breast cancer syndromes.  These include ATM, CHEK2, PALB2, etc.  We discussed that testing is beneficial for several reasons including knowing about other cancer risks, identifying potential screening and risk-reduction options that may be appropriate, and to understand if other family members could be at risk for cancer and allow them to undergo genetic testing.  We reviewed the characteristics, features and inheritance patterns of hereditary cancer syndromes. We also discussed genetic testing, including the appropriate family members to test, the process of testing, insurance coverage and turn-around-time for results. We discussed the implications of a negative, positive and/or variant of uncertain significant result. We recommended Jennifer Walker pursue genetic testing for the [facility] Common Hereditary Cancers gene panel.   The Common Hereditary Gene Panel offered by [facility] includes sequencing and/or deletion duplication testing of the following 48 genes: APC, ATM, AXIN2, BARD1, BMPR1A, BRCA1, BRCA2, BRIP1, CDH1, CDK4, CDKN2A (p14ARF),  CDKN2A (p16INK4a), CHEK2, CTNNA1, DICER1, EPCAM (Deletion/duplication testing only), GREM1 (promoter region deletion/duplication testing only), KIT, MEN1, MLH1, MSH2, MSH3, MSH6, MUTYH, NBN, NF1, NHTL1, PALB2, PDGFRA, PMS2, POLD1, POLE, PTEN, RAD50, RAD51C, RAD51D, RNF43, SDHB, SDHC, SDHD, SMAD4, SMARCA4. STK11, TP53, TSC1, TSC2, and VHL.  The following genes were evaluated for sequence changes only: SDHA and HOXB13 c.251G>A variant only.   We discussed with Jennifer Walker that her personal and family history does not meet insurance or NCCN criteria for genetic testing and is not highly consistent with a familial hereditary cancer syndrome.  We feel she is at low risk to harbor a gene mutation associated with such a condition.  Jennifer Walker is still interested in moving forward with genetic testing and feels that she would benefit from having this knowledge.  Her out of pocket cost will be $250.    PLAN: After considering the risks, benefits, and limitations, Jennifer Walker provided informed consent to pursue genetic testing.  A saliva collection kit will be sent to her house, after which it will be sent to [facility] for analysis of the Common Hereditary Cancers Panel. Results should be available within approximately two-three weeks' time, at which point they will be disclosed by telephone to Jennifer Walker, as will any additional recommendations warranted by these results. Jennifer Walker will receive a summary of her genetic counseling visit and a copy of her results once available. This information will also be available in Epic.   Ms. Sicard questions were answered to her satisfaction today. Our contact information was provided should additional questions or concerns arise. Thank you for the referral and allowing [place] to share in the care of your patient.   Clint Guy, MS Genetic Counselor [email or web address] Phone: [phone]   The patient was seen for a total of 45 minutes in  face-to-face genetic counseling.  This patient was discussed with Drs. Magrinat, Lindi Adie and/or Burr Medico who agrees with the above.   _______________________________________________________________________ For Office Staff:  Number of people involved in session: 1 Was an Intern/ student involved with case: no

## 2018-11-14 ENCOUNTER — Ambulatory Visit
Admission: RE | Admit: 2018-11-14 | Discharge: 2018-11-14 | Disposition: A | Discharge status: Home / Self Care | Payer: 59 | Source: Ambulatory Visit | Attending: Adult Health | Admitting: Adult Health

## 2018-11-14 ENCOUNTER — Other Ambulatory Visit: Payer: Self-pay

## 2018-11-14 DIAGNOSIS — C50412 Malignant neoplasm of upper-outer quadrant of left female breast: Secondary | ICD-10-CM

## 2018-11-14 DIAGNOSIS — N63 Unspecified lump in unspecified breast: Secondary | ICD-10-CM

## 2018-11-22 ENCOUNTER — Ambulatory Visit: Payer: 59

## 2018-11-22 NOTE — Telephone Encounter (Signed)
Opened by accident

## 2018-11-24 ENCOUNTER — Telehealth: Payer: Self-pay | Admitting: Genetic Counselor

## 2018-11-24 ENCOUNTER — Encounter: Payer: Self-pay | Admitting: Genetic Counselor

## 2018-11-24 ENCOUNTER — Ambulatory Visit: Payer: Self-pay | Admitting: Genetic Counselor

## 2018-11-24 DIAGNOSIS — Z1379 Encounter for other screening for genetic and chromosomal anomalies: Secondary | ICD-10-CM

## 2018-11-24 NOTE — Telephone Encounter (Signed)
Revealed negative genetic testing.  Discussed that we do not know why she has breast cancer or why there is cancer in the family. It could be due to a different gene that we are not testing, or maybe our current technology may not be able to pick something up.  It will be important for her to keep in contact with genetics to keep up with whether additional testing may be needed. 

## 2018-11-24 NOTE — Progress Notes (Signed)
HPI:  Ms. Granholm was previously seen in the [facility] clinic due to a personal history of breast cancer and a family history of breast and prostate cancer, and concerns regarding a hereditary predisposition to cancer. Please refer to our prior cancer genetics clinic note for more information regarding our discussion, assessment and recommendations, at the time. Ms. Pincock recent genetic test results were disclosed to her, as were recommendations warranted by these results. These results and recommendations are discussed in more detail below.  CANCER HISTORY:  Oncology History  Primary cancer of upper outer quadrant of left female breast ([place])  11/01/2011 Surgery   Left lumpectomy T1 cN0 M0 stage IA invasive ductal carcinoma grade 1 ER 100%, PR 39%, HER-2 negative, Ki-67 8%, Oncotype DX score 13, 8% ROR   01/11/2012 - 01/27/2012 Radiation Therapy   Adjuvant radiation therapy   02/03/2012 - 02/22/2017 Anti-estrogen oral therapy   Anastrozole 1 mg daily   11/23/2018 Genetic Testing   Negative genetic testing on the [facility] Common Hereditary Cancers panel.  The Common Hereditary Cancers Panel offered by [facility] includes sequencing and/or deletion duplication testing of the following 48 genes: APC, ATM, AXIN2, BARD1, BMPR1A, BRCA1, BRCA2, BRIP1, CDH1, CDK4, CDKN2A (p14ARF), CDKN2A (p16INK4a), CHEK2, CTNNA1, DICER1, EPCAM (Deletion/duplication testing only), GREM1 (promoter region deletion/duplication testing only), KIT, MEN1, MLH1, MSH2, MSH3, MSH6, MUTYH, NBN, NF1, NHTL1, PALB2, PDGFRA, PMS2, POLD1, POLE, PTEN, RAD50, RAD51C, RAD51D, RNF43, SDHB, SDHC, SDHD, SMAD4, SMARCA4. STK11, TP53, TSC1, TSC2, and VHL.  The following genes were evaluated for sequence changes only: SDHA and HOXB13 c.251G>A variant only.     FAMILY HISTORY:  We obtained a detailed, 4-generation family history.  Significant diagnoses are listed below: Family History  Problem Relation Age of Onset  . Breast  cancer Mother 70       Inflammatory breast cancer  . Heart attack Father   . Tongue cancer Brother 86  . Lung cancer Brother 36  . Prostate cancer Brother 29       not metastatic, treated with radiation  . Cancer - Other Sister 6       vulvar cancer  . Cancer Maternal Uncle        unknown cancer, diagnosed late 58s    Ms. Pink has two sons, age 31 and 58. She also has a sister and a brother. Her brother had mouth cancer diagnosed when he was 80, lung cancer diagnosed shortly after, and non-metastatic prostate cancer diagnosed when he was 10. Of note, her brother was a heavy smoker. Ms. Boyde also has a niece who has had health issues with her female organs and is undergoing increased screening for cancer.  Since we last spoke, Ms. Wanless's sister has been diagnosed with vulvar cancer. She also reports that her sister's daughter decided to have genetic testing for hereditary cancer risks as well.  Ms. Pistole mother was diagnosed with inflammatory breast cancer when she was 56, and died at 50. She has 3 maternal uncles, one of whom had an unknown cancer that he died from in his late 47s or early 55s. She also has 2 maternal half-aunts in their mid 51s and 25s who have not had cancer. Ms. Aarons maternal grandmother died in her 49s during WWII, and her maternal grandfather died when he was older due to heart problems.  Ms. Dobosz father died at 31 from a heart attack. She had one paternal aunt and at least two paternal uncles. Her paternal grandmother died when she was  older, and her paternal grandfather died when he was 54 due to a motorcycle accident. Ms. Arizmendi does not know of any individuals on her paternal side with cancer, but she has limited information about this side of the family.  Ms. Middendorf is unaware of previous family history of genetic testing for hereditary cancer risks. Patient's maternal ancestors are of [place] descent, and paternal ancestors are of [place]  and [place] descent. There is no reported Ashkenazi Jewish ancestry. There is no known consanguinity.  GENETIC TEST RESULTS: Genetic testing reported out on 11/23/2018 through the [facility] Common Hereditary Cancers panel found no pathogenic variants. The Common Hereditary Gene Panel offered by [facility] includes sequencing and/or deletion duplication testing of the following 48 genes: APC, ATM, AXIN2, BARD1, BMPR1A, BRCA1, BRCA2, BRIP1, CDH1, CDK4, CDKN2A (p14ARF), CDKN2A (p16INK4a), CHEK2, CTNNA1, DICER1, EPCAM (Deletion/duplication testing only), GREM1 (promoter region deletion/duplication testing only), KIT, MEN1, MLH1, MSH2, MSH3, MSH6, MUTYH, NBN, NF1, NHTL1, PALB2, PDGFRA, PMS2, POLD1, POLE, PTEN, RAD50, RAD51C, RAD51D, RNF43, SDHB, SDHC, SDHD, SMAD4, SMARCA4. STK11, TP53, TSC1, TSC2, and VHL.  The following genes were evaluated for sequence changes only: SDHA and HOXB13 c.251G>A variant only. The test report will be scanned into EPIC and located under the Molecular Pathology section of the Results Review tab.  A portion of the result report is included below for reference.     We discussed with Ms. Kyllo that because current genetic testing is not perfect, it is possible there may be a gene mutation in one of these genes that current testing cannot detect, but that chance is small.  We also discussed, that there could be another gene that has not yet been discovered, or that we have not yet tested, that is responsible for the cancer diagnoses in the family. It is also possible there is a hereditary cause for the cancer in the family that Ms. Ticas did not inherit and therefore was not identified in her testing.  Therefore, it is important to remain in touch with cancer genetics in the future so that we can continue to offer Ms. Bohnet the most up to date genetic testing.   ADDITIONAL GENETIC TESTING: We discussed with Ms. Quincy that her genetic testing was fairly extensive.  If there are  genes identified to increase cancer risk that can be analyzed in the future, we would be happy to discuss and coordinate this testing at that time.    CANCER SCREENING RECOMMENDATIONS: Ms. Galindez test result is considered negative (normal).  This means that we have not identified a hereditary cause for her personal and family history of cancer at this time. Most cancers happen by chance and this negative test suggests that her cancer may fall into this category.    While reassuring, this does not definitively rule out a hereditary predisposition to cancer. It is still possible that there could be genetic mutations that are undetectable by current technology. There could be genetic mutations in genes that have not been tested or identified to increase cancer risk.  Therefore, it is recommended she continue to follow the cancer management and screening guidelines provided by her oncology and primary healthcare providers.   An individual's cancer risk and medical management are not determined by genetic test results alone. Overall cancer risk assessment incorporates additional factors, including personal medical history, family history, and any available genetic information that may result in a personalized plan for cancer prevention and surveillance.  RECOMMENDATIONS FOR FAMILY MEMBERS:  Individuals in this family might be  at some increased risk of developing cancer, over the general population risk, simply due to the family history of cancer.  We recommended women in this family have a yearly mammogram beginning at age 47, or 57 years younger than the earliest onset of cancer, an annual clinical breast exam, and perform monthly breast self-exams. Women in this family should also have a gynecological exam as recommended by their primary provider. All family members should have a colonoscopy by age 47.  FOLLOW-UP: Lastly, we discussed with Ms. Highland that cancer genetics is a rapidly advancing field and  it is possible that new genetic tests will be appropriate for her and/or her family members in the future. We encouraged her to remain in contact with cancer genetics on an annual basis so we can update her personal and family histories and let her know of advances in cancer genetics that may benefit this family.   Our contact number was provided. Ms. Cihlar questions were answered to her satisfaction, and she knows she is welcome to call us at anytime with additional questions or concerns.   Clint Guy, MS Genetic Counselor [email or web address] Phone: [phone]

## 2019-02-10 ENCOUNTER — Other Ambulatory Visit: Payer: Self-pay

## 2019-02-10 ENCOUNTER — Emergency Department (HOSPITAL_COMMUNITY): Payer: 59

## 2019-02-10 ENCOUNTER — Encounter (HOSPITAL_COMMUNITY): Payer: Self-pay | Admitting: Radiology

## 2019-02-10 ENCOUNTER — Emergency Department (HOSPITAL_COMMUNITY)
Admission: EM | Admit: 2019-02-10 | Discharge: 2019-02-11 | Disposition: A | Discharge status: Home / Self Care | Payer: 59 | Attending: Emergency Medicine | Admitting: Emergency Medicine

## 2019-02-10 DIAGNOSIS — R1084 Generalized abdominal pain: Secondary | ICD-10-CM

## 2019-02-10 DIAGNOSIS — Z853 Personal history of malignant neoplasm of breast: Secondary | ICD-10-CM | POA: Diagnosis not present

## 2019-02-10 DIAGNOSIS — Z79899 Other long term (current) drug therapy: Secondary | ICD-10-CM | POA: Insufficient documentation

## 2019-02-10 DIAGNOSIS — R112 Nausea with vomiting, unspecified: Secondary | ICD-10-CM | POA: Diagnosis not present

## 2019-02-10 DIAGNOSIS — F419 Anxiety disorder, unspecified: Secondary | ICD-10-CM | POA: Insufficient documentation

## 2019-02-10 DIAGNOSIS — K529 Noninfective gastroenteritis and colitis, unspecified: Secondary | ICD-10-CM | POA: Diagnosis not present

## 2019-02-10 LAB — CBC
HCT: 39.5 % (ref 36.0–46.0)
Hemoglobin: 13.1 g/dL (ref 12.0–15.0)
MCH: 31.6 pg (ref 26.0–34.0)
MCHC: 33.2 g/dL (ref 30.0–36.0)
MCV: 95.4 fL (ref 80.0–100.0)
Platelets: 283 10*3/uL (ref 150–400)
RBC: 4.14 MIL/uL (ref 3.87–5.11)
RDW: 11.8 % (ref 11.5–15.5)
WBC: 10.3 10*3/uL (ref 4.0–10.5)
nRBC: 0 % (ref 0.0–0.2)

## 2019-02-10 LAB — COMPREHENSIVE METABOLIC PANEL
ALT: 19 U/L (ref 0–44)
AST: 24 U/L (ref 15–41)
Albumin: 4 g/dL (ref 3.5–5.0)
Alkaline Phosphatase: 36 U/L — ABNORMAL LOW (ref 38–126)
Anion gap: 14 (ref 5–15)
BUN: 13 mg/dL (ref 8–23)
CO2: 26 mmol/L (ref 22–32)
Calcium: 9.4 mg/dL (ref 8.9–10.3)
Chloride: 100 mmol/L (ref 98–111)
Creatinine, Ser: 0.8 mg/dL (ref 0.44–1.00)
GFR calc Af Amer: >60 mL/min (ref >60)
GFR calc non Af Amer: >60 mL/min (ref >60)
Glucose, Bld: 165 mg/dL — ABNORMAL HIGH (ref 70–99)
Potassium: 4 mmol/L (ref 3.5–5.1)
Sodium: 140 mmol/L (ref 135–145)
Total Bilirubin: 1.1 mg/dL (ref 0.3–1.2)
Total Protein: 7.5 g/dL (ref 6.5–8.1)

## 2019-02-10 LAB — LIPASE, BLOOD: Lipase: 17 U/L (ref 11–51)

## 2019-02-10 MED ORDER — SODIUM CHLORIDE 0.9% FLUSH: 3.0000 mL | Freq: Once | INTRAVENOUS | Status: DC

## 2019-02-10 MED ORDER — ONDANSETRON HCL 4 MG/2ML IJ SOLN
4.0000 mg | Freq: Once | INTRAMUSCULAR | Status: AC
  Administered 2019-02-10: 4 mg via INTRAVENOUS
  Filled 2019-02-10: qty 2

## 2019-02-10 MED ORDER — MORPHINE SULFATE (PF) 4 MG/ML IV SOLN
4.0000 mg | Freq: Once | INTRAVENOUS | Status: AC
  Administered 2019-02-10: 4 mg via INTRAVENOUS
  Filled 2019-02-10: qty 1

## 2019-02-10 MED ORDER — IOHEXOL 350 MG/ML SOLN
100.0000 mL | Freq: Once | INTRAVENOUS | Status: AC | PRN
  Administered 2019-02-10: 100 mL via INTRAVENOUS

## 2019-02-10 NOTE — ED Provider Notes (Addendum)
[facility] EMERGENC[facility] DEPARTMENT Provider Note   CSN: [id number] Arrival date & time: 02/10/19  1848     History   Chief Complaint Chief Complaint  Patient presents with   Abdominal Pain    HPI Jennifer Walker is a 63 y.o. female.     63 year old female with history of breast cancer who presents with abdominal pain.  Patient states that earlier today, she had a sudden onset of severe, central abdominal pain that radiates to her right flank and has been associated with nausea and vomiting.  At one point it seemed to get better and she tried to avoid coming but the pain became severe again.  She has had normal bowel movement today, no blood.  No episodes of diarrhea.  No associated urinary symptoms, fevers, cough, chest pain, or shortness of breath.  She has never had symptoms like this before and denies any history of kidney stones.  The history is provided by the patient.  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, her2  negative   Family history of cancer of mouth    Family history of lung cancer    Family history of prostate cancer    Fatigue    H/O colonoscopy 2003   Hot flashes    Insomnia    Personal history of radiation therapy    PONV (postoperative nausea and vomiting)    Psoriasis    Wears glasses     Patient Active Problem List   Diagnosis Date Noted   Genetic testing 11/24/2018   Family history of cancer of mouth    Family history of prostate cancer    Family history of lung cancer    Malignant tumor of breast ([place]) 09/27/2017   Right flank pain 09/27/2017   Urinary tract infectious disease 09/27/2017   Insomnia due to stress 10/04/2013   Dyspareunia, female 04/23/2013   Pelvic floor dysfunction 04/23/2013   History of breast cancer  04/23/2013   Pulmonary nodule 04/23/2013   Primary cancer of upper outer quadrant of left female breast ([place]) 10/07/2011    Past Surgical History:  Procedure Laterality Date   BREAST LUMPECTOMY Left 11/01/2011   left breast lumpectomy   BREAST SURGERY  10/1976   right breast benign tumor   [place]  2005, 2007   bilateral    LYMPH GLAND EXCISION  1978   right axillary   [place]   right breast     OB History    Gravida  3   Para      Term      Preterm      AB  1   Living  2     SAB      TAB      Ectopic      Multiple      Live Births               Home Medications    Prior to Admission medications   Medication Sig Start Date End Date Taking? Authorizing Provider  calcipotriene-betamethasone (TACLONEX) ointment Apply 1 application topically daily as needed (psoriasis).    Yes [provider]  Glucosamine-Chondroit-Vit C-Mn (GLUCOSAMINE 1500 COMPLEX PO) Take 2 tablets by mouth daily.   Yes [provider]  Melatonin 5 MG TABS Take 5 mg by mouth at bedtime.   Yes [provider]  Multiple Vitamin (MULTIVITAMIN WITH MINERALS) TABS tablet Take 1 tablet by mouth daily.   Yes [provider]  naproxen sodium (ALEVE) 220 MG tablet Take 220 mg by mouth 2 (two) times daily as needed (pain).   Yes [provider]  OVER THE COUNTER MEDICATION Apply 1 application topically daily as needed (knee pain). CBD Cream   Yes [provider]  ondansetron (ZOFRAN ODT) 4 MG disintegrating tablet Take 1 tablet (4 mg total) by mouth every 8 (eight) hours as needed for nausea or vomiting. 02/11/19   Sheilia Reznick, Wenda Overland, MD  oxyCODONE-acetaminophen (PERCOCET) 5-325 MG tablet Take 1 tablet by mouth every 4 (four) hours as needed for severe pain. 02/11/19   Pixie Burgener, Wenda Overland, MD    Family History Family History    Problem Relation Age of Onset   Breast cancer Mother 61       Inflammatory breast cancer   Heart attack Father    Tongue cancer Brother 58   Lung cancer Brother 53   Prostate cancer Brother 43       not metastatic, treated with radiation   Cancer - Other Sister 58       vulvar cancer   Cancer Maternal Uncle        unknown cancer, diagnosed late 10s    Social History Social History   Tobacco Use   Smoking status: Never Smoker   Smokeless tobacco: Never Used  Substance Use Topics   Alcohol use: Yes    Alcohol/week: 5.0 standard drinks    Types: 5 Standard drinks or equivalent per week   Drug use: No     Allergies   Sulfa antibiotics, Sulfa drugs cross reactors, and Tape   Review of Systems Review of Systems  Gastrointestinal: Positive for abdominal pain.   All other systems reviewed and are negative except that which was mentioned in HPI   Physical Exam Updated Vital Signs BP 112/70 (BP Location: Left Arm)    Pulse 65    Temp 98.1 F (36.7 C) (Oral)    Resp 18    LMP 03/29/2009    SpO2 100%   Physical Exam Vitals signs and nursing note reviewed.  Constitutional:      General: She is in acute distress.     Appearance: She is well-developed.     Comments: Pacing room then rolling around in bed, in distress due to pain  HENT:     Head: Normocephalic and atraumatic.  Eyes:     Conjunctiva/sclera: Conjunctivae normal.  Neck:     Musculoskeletal: Neck supple.  Cardiovascular:     Rate and Rhythm: Normal rate and regular rhythm.     Heart sounds: Normal heart sounds. No murmur.  Pulmonary:     Effort: Pulmonary effort is normal.     Breath sounds: Normal breath sounds.  Abdominal:     General: Bowel sounds are normal. There is no distension.     Palpations: Abdomen is soft.     Tenderness: There is generalized abdominal tenderness. There is no guarding or rebound.  Skin:    General: Skin is warm and dry.  Neurological:     Mental Status: She is  alert and oriented to person, place, and time.     Comments:  Fluent speech  Psychiatric:        Mood and Affect: Mood is anxious.        Judgment: Judgment normal.      ED Treatments / Results  Labs (all labs ordered are listed, but only abnormal results are displayed) Labs Reviewed  COMPREHENSIVE METABOLIC PANEL - Abnormal; Notable for the following components:      Result Value   Glucose, Bld 165 (*)    Alkaline Phosphatase 36 (*)    All other components within normal limits  LIPASE, BLOOD  CBC  URINALYSIS, ROUTINE W REFLEX MICROSCOPIC    EKG None  Radiology Ct Renal Stone Study  Result Date: 02/10/2019 CLINICAL DATA:  Mid abdominal pain EXAM: CT ABDOMEN AND PELVIS WITHOUT CONTRAST TECHNIQUE: Multidetector CT imaging of the abdomen and pelvis was performed following the standard protocol without IV contrast. COMPARISON:  CT 10/04/2012 FINDINGS: Lower chest: No acute abnormality. Hepatobiliary: No focal liver abnormality is seen. No gallstones, gallbladder wall thickening, or biliary dilatation. Pancreas: Unremarkable. No pancreatic ductal dilatation or surrounding inflammatory changes. Spleen: Normal in size without focal abnormality. Adrenals/Urinary Tract: Adrenal glands are unremarkable. Kidneys are normal, without renal calculi, focal lesion, or hydronephrosis. Bladder is unremarkable. Stomach/Bowel: The stomach is nonenlarged. Negative appendix. No colon wall thickening. Abnormal appearing small bowel in the left lower quadrant and pelvis. Diffuse haziness and mesenteric stranding within the pelvis. Potential thickened edematous jejunal loops in the left upper quadrant. Vascular/Lymphatic: Mild aortic atherosclerosis. No aneurysm. Appearance of mesenteric vascular congestion in the left upper quadrant and within the pelvis. No significantly enlarged lymph nodes Reproductive: Uterus and bilateral adnexa are unremarkable. Other: No free air.  Small free fluid in the pelvis.  Musculoskeletal: Degenerative changes. No acute or suspicious osseous abnormality IMPRESSION: 1. Negative for hydronephrosis or ureteral stone. 2. Abnormal appearing small bowel in the pelvis and left lower quadrant with suspected wall thickening and associated mesenteric vascular congestion. There may be jejunal bowel wall thickening in the left upper quadrant with slight mesenteric vascular congestion as well. Findings could be secondary to enteritis from ischemia, infection, or inflammatory bowel disease. Contrast-enhanced examination would be helpful in confirming vascular patency. 3. Small free fluid in the pelvis Electronically Signed   By: Donavan Foil M.D.   On: 02/10/2019 21:20   Ct Angio Abd/pel W And/or Wo Contrast  Result Date: 02/10/2019 CLINICAL DATA:  Severe generalized abdominal pain radiating to the back EXAM: CTA ABDOMEN AND PELVIS WITHOUT AND WITH CONTRAST TECHNIQUE: Multidetector CT imaging of the abdomen and pelvis was performed using the standard protocol during bolus administration of intravenous contrast. Multiplanar reconstructed images and MIPs were obtained and reviewed to evaluate the vascular anatomy. CONTRAST:  158m OMNIPAQUE IOHEXOL 350 MG/ML SOLN COMPARISON:  CT 02/10/2019 FINDINGS: VASCULAR Aorta: Normal caliber aorta without aneurysm, dissection, vasculitis or significant stenosis. Celiac: Moderate J-shaped focal narrowing of the superior aspect of the proximal celiac trunk. Slight poststenotic dilatation. No occlusion. SMA: Patent without evidence of aneurysm, dissection, vasculitis or significant stenosis. Renals: Single left and 2 right renal arteries are patent and without significant disease IMA: Patent without evidence of aneurysm, dissection, vasculitis or significant stenosis. Inflow: Patent without evidence of aneurysm, dissection, vasculitis or significant stenosis. Proximal Outflow: Bilateral common femoral and visualized portions of the superficial and profunda  femoral arteries are patent without evidence of aneurysm, dissection, vasculitis or significant stenosis. Veins: No obvious venous abnormality within the limitations of this arterial phase study. Review of the MIP images confirms the above  findings. NON-VASCULAR Lower chest: No acute abnormality. Hepatobiliary: No focal liver abnormality is seen. No gallstones, gallbladder wall thickening, or biliary dilatation. Pancreas: Unremarkable. No pancreatic ductal dilatation or surrounding inflammatory changes. Spleen: Normal in size without focal abnormality. Adrenals/Urinary Tract: Adrenal glands are unremarkable. Kidneys are normal, without renal calculi, focal lesion, or hydronephrosis. Bladder is unremarkable. Stomach/Bowel: The stomach is nonenlarged. Indistinct appearing left upper quadrant and mid abdominal small bowel with hazy appearance of the associated mesentery. Mild intraluminal hyperdensity appears to be present on non contrasted images and is doubtful for blood product. Negative appendix. Lymphatic: No significantly enlarged lymph nodes Reproductive: Uterus and bilateral adnexa are unremarkable. Other: No free air.  Moderate free fluid in the pelvis Musculoskeletal: Degenerative changes. No acute or suspicious abnormality. IMPRESSION: VASCULAR 1. Negative for aortic dissection or acute aortic or mesenteric arterial occlusive disease. 2. Focal J-shaped narrowing of the proximal celiac trunk, with mild poststenotic dilatation. This could be normal variant, though can be seen in association with median arcuate ligament syndrome in the appropriate clinical setting. NON-VASCULAR 1. Similar appearance of indistinct slightly edematous appearing small bowel in the left upper quadrant and mid abdomen with associated hazy edema in the associated mesentery, possibly indicating enteritis. 2. Moderate free fluid in the pelvis. Electronically Signed   By: Donavan Foil M.D.   On: 02/10/2019 23:51     Procedures Procedures (including critical care time)  Medications Ordered in ED Medications  sodium chloride flush (NS) 0.9 % injection 3 mL (3 mLs Intravenous Not Given 02/10/19 1931)  ondansetron (ZOFRAN) injection 4 mg (4 mg Intravenous Given 02/10/19 2022)  morphine 4 MG/ML injection 4 mg (4 mg Intravenous Given 02/10/19 2022)  iohexol (OMNIPAQUE) 350 MG/ML injection 100 mL (100 mLs Intravenous Contrast Given 02/10/19 2257)     Initial Impression / Assessment and Plan / ED Course  I have reviewed the triage vital signs and the nursing notes.  Pertinent labs & imaging results that were available during my care of the patient were reviewed by me and considered in my medical decision making (see chart for details).        In distress due to pain on exam, afebrile with reassuring vital signs.  Differential is broad and includes kidney stone, bowel obstruction, gallbladder pathology, pancreatitis. Less likely appendicitis or ovarian pathology given no focal lower abd pain.  Given sudden onset of pain radiating to flank, obtain CT renal study which was negative for stone but showed some small bowel wall thickening and mesenteric vascular congestion.  I discussed these findings with the radiologist and we agreed that although it was unlikely to represent mesenteric ischemia, it would be prudent to obtain CTA abdomen/pelvis to rule out vascular pathology.  CTA showed no vascular problems, similar edema of small bowel consistent with enteritis.  Patient remains well-appearing on reassessment, drinking water and stating she feels much better.  I discussed supportive measures including bland diet, Zofran as needed, and PCP follow-up.  I have extensively reviewed return precautions and she voiced understanding. Final Clinical Impressions(s) / ED Diagnoses   Final diagnoses:  Enteritis  Generalized abdominal pain  Non-intractable vomiting with nausea, unspecified vomiting type    ED  Discharge Orders         Ordered    ondansetron (ZOFRAN ODT) 4 MG disintegrating tablet  Every 8 hours PRN     02/11/19 0010    oxyCODONE-acetaminophen (PERCOCET) 5-325 MG tablet  Every 4 hours PRN     02/11/19 0010  Rashonda Warrior, Wenda Overland, MD 02/10/19 2353    Rex Kras Wenda Overland, MD 02/11/19 [phone]

## 2019-02-10 NOTE — ED Triage Notes (Signed)
Pt to ED c/o generalized abdominal pain that radiates to her lower back. Pt denies blood in urine, reports emesis that started two hours ago.

## 2019-02-11 MED ORDER — OXYCODONE-ACETAMINOPHEN 5-325 MG PO TABS: 1.0000 | ORAL_TABLET | ORAL | 0 refills | Status: DC | PRN

## 2019-02-11 MED ORDER — ONDANSETRON 4 MG PO TBDP: 4.0000 mg | ORAL_TABLET | Freq: Three times a day (TID) | ORAL | 0 refills | Status: DC | PRN

## 2019-06-19 ENCOUNTER — Encounter: Payer: Self-pay | Admitting: Certified Nurse Midwife

## 2019-10-05 ENCOUNTER — Ambulatory Visit: Payer: 59 | Admitting: Certified Nurse Midwife

## 2019-10-29 ENCOUNTER — Other Ambulatory Visit: Payer: Self-pay | Admitting: Adult Health

## 2019-10-29 DIAGNOSIS — Z1231 Encounter for screening mammogram for malignant neoplasm of breast: Secondary | ICD-10-CM

## 2019-11-01 ENCOUNTER — Encounter: Payer: Self-pay | Admitting: Adult Health

## 2019-11-01 ENCOUNTER — Other Ambulatory Visit: Payer: Self-pay

## 2019-11-01 ENCOUNTER — Inpatient Hospital Stay: Payer: 59 | Attending: Adult Health | Admitting: Adult Health

## 2019-11-01 VITALS — BP 107/55 | HR 65 | Temp 98.1°F | Resp 16 | Ht 69.5 in | Wt 163.0 lb

## 2019-11-01 DIAGNOSIS — M2550 Pain in unspecified joint: Secondary | ICD-10-CM | POA: Diagnosis not present

## 2019-11-01 DIAGNOSIS — M1990 Unspecified osteoarthritis, unspecified site: Secondary | ICD-10-CM | POA: Diagnosis not present

## 2019-11-01 DIAGNOSIS — Z7289 Other problems related to lifestyle: Secondary | ICD-10-CM | POA: Insufficient documentation

## 2019-11-01 DIAGNOSIS — Z808 Family history of malignant neoplasm of other organs or systems: Secondary | ICD-10-CM | POA: Insufficient documentation

## 2019-11-01 DIAGNOSIS — Z8049 Family history of malignant neoplasm of other genital organs: Secondary | ICD-10-CM | POA: Diagnosis not present

## 2019-11-01 DIAGNOSIS — Z882 Allergy status to sulfonamides status: Secondary | ICD-10-CM | POA: Diagnosis not present

## 2019-11-01 DIAGNOSIS — C50412 Malignant neoplasm of upper-outer quadrant of left female breast: Secondary | ICD-10-CM

## 2019-11-01 DIAGNOSIS — Z79899 Other long term (current) drug therapy: Secondary | ICD-10-CM | POA: Diagnosis not present

## 2019-11-01 DIAGNOSIS — Z803 Family history of malignant neoplasm of breast: Secondary | ICD-10-CM | POA: Insufficient documentation

## 2019-11-01 DIAGNOSIS — Z79811 Long term (current) use of aromatase inhibitors: Secondary | ICD-10-CM | POA: Insufficient documentation

## 2019-11-01 DIAGNOSIS — Z801 Family history of malignant neoplasm of trachea, bronchus and lung: Secondary | ICD-10-CM | POA: Diagnosis not present

## 2019-11-01 DIAGNOSIS — Z888 Allergy status to other drugs, medicaments and biological substances status: Secondary | ICD-10-CM | POA: Diagnosis not present

## 2019-11-01 DIAGNOSIS — Z923 Personal history of irradiation: Secondary | ICD-10-CM | POA: Insufficient documentation

## 2019-11-01 DIAGNOSIS — Z8249 Family history of ischemic heart disease and other diseases of the circulatory system: Secondary | ICD-10-CM | POA: Diagnosis not present

## 2019-11-01 DIAGNOSIS — Z8042 Family history of malignant neoplasm of prostate: Secondary | ICD-10-CM | POA: Diagnosis not present

## 2019-11-01 DIAGNOSIS — Z170 Estrogen receptor positive status [ER+]: Secondary | ICD-10-CM | POA: Diagnosis not present

## 2019-11-01 NOTE — Progress Notes (Signed)
CLINIC:  Survivorship   REASON FOR VISIT:  Routine follow-up for history of breast cancer.   BRIEF ONCOLOGIC HISTORY:  Oncology History  Primary cancer of upper outer quadrant of left female breast ([place])  11/01/2011 Surgery   Left lumpectomy T1 cN0 M0 stage IA invasive ductal carcinoma grade 1 ER 100%, PR 39%, HER-2 negative, Ki-67 8%, Oncotype DX score 13, 8% ROR   01/11/2012 - 01/27/2012 Radiation Therapy   Adjuvant radiation therapy   02/03/2012 - 02/22/2017 Anti-estrogen oral therapy   Anastrozole 1 mg daily   11/23/2018 Genetic Testing   Negative genetic testing on the [facility] Common Hereditary Cancers panel.  The Common Hereditary Cancers Panel offered by [facility] includes sequencing and/or deletion duplication testing of the following 48 genes: APC, ATM, AXIN2, BARD1, BMPR1A, BRCA1, BRCA2, BRIP1, CDH1, CDK4, CDKN2A (p14ARF), CDKN2A (p16INK4a), CHEK2, CTNNA1, DICER1, EPCAM (Deletion/duplication testing only), GREM1 (promoter region deletion/duplication testing only), KIT, MEN1, MLH1, MSH2, MSH3, MSH6, MUTYH, NBN, NF1, NHTL1, PALB2, PDGFRA, PMS2, POLD1, POLE, PTEN, RAD50, RAD51C, RAD51D, RNF43, SDHB, SDHC, SDHD, SMAD4, SMARCA4. STK11, TP53, TSC1, TSC2, and VHL.  The following genes were evaluated for sequence changes only: SDHA and HOXB13 c.251G>A variant only.      INTERVAL HISTORY:  Ms. Mcdonnell presents to the Survivorship Clinic today for routine follow-up for her history of breast cancer.  Overall, she reports feeling quite well.   She is scheduled for a 3d screening mammogram on 11/15/2019.  She underwent diagnostic mammogram in 10/2018 that showed a stable intramammary lymph node in right upper outer quadrant and no other area of concern in her breast.  Her breast density was category B.    She continues to exercise and is following a keto diet.  She has some joint issues, but notes that she is working through these with CBD oil and aleve.  She is otherwise feeling well and  without any questions or concerns.     REVIEW OF SYSTEMS:  Review of Systems  Constitutional: Negative for appetite change, chills, fatigue, fever and unexpected weight change.  HENT:   Negative for hearing loss, lump/mass, sore throat and trouble swallowing.   Eyes: Negative for eye problems and icterus.  Respiratory: Negative for chest tightness, cough and shortness of breath.   Cardiovascular: Negative for chest pain, leg swelling and palpitations.  Gastrointestinal: Negative for abdominal distention, abdominal pain, constipation, diarrhea, nausea and vomiting.  Endocrine: Negative for hot flashes.  Genitourinary: Negative for difficulty urinating.   Musculoskeletal: Positive for arthralgias.  Skin: Negative for itching and rash.  Neurological: Negative for dizziness, extremity weakness, headaches and numbness.  Hematological: Negative for adenopathy. Does not bruise/bleed easily.  Psychiatric/Behavioral: Negative for depression. The patient is not nervous/anxious.   Breast: Denies any new nodularity, masses, tenderness, nipple changes, or nipple discharge.      PAST MEDICAL/SURGICAL HISTORY:  Past Medical History:  Diagnosis Date  . Abnormal Pap smear of cervix    yrs ago, just a repeat done  . Arthritis   . Breast cancer ([place])    Invasive ductal ca grade 1 er/pr positive, her2  negative  . Family history of cancer of mouth   . Family history of lung cancer   . Family history of prostate cancer   . Fatigue   . H/O colonoscopy 2003  . Hot flashes   . Insomnia   . Personal history of radiation therapy   . PONV (postoperative nausea and vomiting)   . Psoriasis   . Wears glasses  Past Surgical History:  Procedure Laterality Date  . BREAST LUMPECTOMY Left 11/01/2011   left breast lumpectomy  . BREAST SURGERY  10/1976   right breast benign tumor  . DILATION AND CURETTAGE OF UTERUS    . KNEE SURGERY  2005, 2007   bilateral   . LYMPH GLAND EXCISION  1978   right  axillary  . [place]  . POLYPECTOMY    . TUMOR REMOVAL  1975   right breast     ALLERGIES:  Allergies  Allergen Reactions  . Sulfa Antibiotics Other (See Comments)    Yeast infections and ulcers  . Sulfa Drugs Cross Reactors Other (See Comments)    Yeast infections and ulcers  . Tape Rash     CURRENT MEDICATIONS:  Outpatient Encounter Medications as of 11/01/2019  Medication Sig  . calcipotriene-betamethasone (TACLONEX) ointment Apply 1 application topically daily as needed (psoriasis).   . Glucosamine-Chondroit-Vit C-Mn (GLUCOSAMINE 1500 COMPLEX PO) Take 2 tablets by mouth daily.  . Melatonin 5 MG TABS Take 5 mg by mouth at bedtime.  . Multiple Vitamin (MULTIVITAMIN WITH MINERALS) TABS tablet Take 1 tablet by mouth daily.  . naproxen sodium (ALEVE) 220 MG tablet Take 220 mg by mouth 2 (two) times daily as needed (pain).  Marland Kitchen OVER THE COUNTER MEDICATION Apply 1 application topically daily as needed (knee pain). CBD Cream  . [DISCONTINUED] ondansetron (ZOFRAN ODT) 4 MG disintegrating tablet Take 1 tablet (4 mg total) by mouth every 8 (eight) hours as needed for nausea or vomiting. (Patient not taking: Reported on 11/01/2019)  . [DISCONTINUED] oxyCODONE-acetaminophen (PERCOCET) 5-325 MG tablet Take 1 tablet by mouth every 4 (four) hours as needed for severe pain. (Patient not taking: Reported on 11/01/2019)   No facility-administered encounter medications on file as of 11/01/2019.     ONCOLOGIC FAMILY HISTORY:  Family History  Problem Relation Age of Onset  . Breast cancer Mother 20       Inflammatory breast cancer  . Heart attack Father   . Tongue cancer Brother 4  . Lung cancer Brother 36  . Prostate cancer Brother 18       not metastatic, treated with radiation  . Cancer - Other Sister 61       vulvar cancer  . Cancer Maternal Uncle        unknown cancer, diagnosed late 49s    GENETIC COUNSELING/TESTING: Reviewed with Roma Kayser.    SOCIAL HISTORY:    Social History   Socioeconomic History  . Marital status: Married    Spouse name: Not on file  . Number of children: Not on file  . Years of education: Not on file  . Highest education level: Not on file  Occupational History  . Not on file  Tobacco Use  . Smoking status: Never Smoker  . Smokeless tobacco: Never Used  Substance and Sexual Activity  . Alcohol use: Yes    Alcohol/week: 5.0 standard drinks    Types: 5 Standard drinks or equivalent per week  . Drug use: No  . Sexual activity: Not Currently    Partners: Male    Birth control/protection: Other-see comments    Comment: husband vasectomy  Other Topics Concern  . Not on file  Social History Narrative  . Not on file   Social Determinants of Health   Financial Resource Strain:   . Difficulty of Paying Living Expenses:   Food Insecurity:   . Worried About Charity fundraiser in the Last  Year:   . Ran Out of Food in the Last Year:   Transportation Needs:   . Film/video editor (Medical):   Marland Kitchen Lack of Transportation (Non-Medical):   Physical Activity:   . Days of Exercise per Week:   . Minutes of Exercise per Session:   Stress:   . Feeling of Stress :   Social Connections:   . Frequency of Communication with Friends and Family:   . Frequency of Social Gatherings with Friends and Family:   . Attends Religious Services:   . Active Member of Clubs or Organizations:   . Attends Archivist Meetings:   Marland Kitchen Marital Status:   Intimate Partner Violence:   . Fear of Current or Ex-Partner:   . Emotionally Abused:   Marland Kitchen Physically Abused:   . Sexually Abused:       PHYSICAL EXAMINATION:  Vital Signs: Vitals:   11/01/19 1112  BP: (!) 107/55  Pulse: 65  Resp: 16  Temp: 98.1 F (36.7 C)  SpO2: 100%   Filed Weights   11/01/19 1112  Weight: 163 lb (73.9 kg)   General: Well-nourished, well-appearing female in no acute distress.  Unaccompanied today.   HEENT: Head is normocephalic.  Pupils equal  and reactive to light. Conjunctivae clear without exudate.  Sclerae anicteric. Oral mucosa is pink, moist.  Oropharynx is pink without lesions or erythema.  Lymph: No cervical, supraclavicular, or infraclavicular lymphadenopathy noted on palpation.  Cardiovascular: Regular rate and rhythm.Marland Kitchen Respiratory: Clear to auscultation bilaterally. Chest expansion symmetric; breathing non-labored.  Breast Exam:  -Left breast: No appreciable masses on palpation. No skin redness, thickening, or peau d'orange appearance; no nipple retraction or nipple discharge; mild distortion in symmetry at previous lumpectomy site healed scar without erythema or nodularity.  -Right breast: No appreciable masses on palpation. No skin redness, thickening, or peau d'orange appearance; no nipple retraction or nipple discharge -Axilla: No axillary adenopathy bilaterally.  GI: Abdomen soft and round; non-tender, non-distended. Bowel sounds normoactive. No hepatosplenomegaly.   GU: Deferred.  Neuro: No focal deficits. Steady gait.  Psych: Mood and affect normal and appropriate for situation.  MSK: No focal spinal tenderness to palpation, full range of motion in bilateral upper extremities Extremities: No edema. Skin: Warm and dry.  LABORATORY DATA:  None for this visit   DIAGNOSTIC IMAGING:  Most recent mammogram: due in 2 weeks,    ASSESSMENT AND PLAN:  Ms.. Roedl is a pleasant 64 y.o. female with history of Stage IA left breast invasive ductal carcinoma, ER+/PR+/HER2-, diagnosed in 2013, treated with lumpectomy, adjuvant radiation therapy, and anti-estrogen therapy with Anastrozole x 5 years completing therapy in 01/2017.  She presents to the Survivorship Clinic for surveillance and routine follow-up.   1. History of breast cancer:  Ms. Krisher is currently clinically and radiographically without evidence of disease or recurrence of breast cancer.  She has continued to intentionally lose weight and is doing great with  this.  After discussion, she will f/u with her PCP and GYN for future breast cancer surveillance.  She will see [place] on an as needed basis.  She knows she can call for any questions or concerns.  2. Bone health:    She was given education on specific food and activities to promote bone health.  3. Cancer screening:  Due to Ms. Ladd's history and her age, she should receive screening for skin cancers, colon cancer, and gynecologic cancers. She was encouraged to follow-up with her PCP for appropriate cancer screenings.  4. Health maintenance and wellness promotion: Ms. Greenwalt was encouraged to consume 5-7 servings of fruits and vegetables per day. She was also encouraged to engage in moderate to vigorous exercise for 30 minutes per day most days of the week. She was instructed to limit her alcohol consumption and continue to abstain from tobacco use.      Dispo:  -[facility] PRN -Mammogram 10/2019  Total encounter time: 20 minutes*   Gardenia Phlegm, NP [place] [phone]  *Total Encounter Time as defined by the Centers for Medicare and Medicaid Services includes, in addition to the face-to-face time of a patient visit (documented in the note above) non-face-to-face time: obtaining and reviewing outside history, ordering and reviewing medications, tests or procedures, care coordination (communications with other health care professionals or caregivers) and documentation in the medical record.   Note: PRIMARY CARE PROVIDER Shon Baton, [place] [phone]

## 2019-11-02 ENCOUNTER — Telehealth: Payer: Self-pay | Admitting: Adult Health

## 2019-11-02 NOTE — Telephone Encounter (Signed)
No 8/5 los. No changes made to pt's schedule.  °

## 2019-11-07 ENCOUNTER — Other Ambulatory Visit: Payer: Self-pay

## 2019-11-07 ENCOUNTER — Encounter: Payer: Self-pay | Admitting: Obstetrics and Gynecology

## 2019-11-07 ENCOUNTER — Ambulatory Visit: Payer: 59 | Admitting: Obstetrics and Gynecology

## 2019-11-07 VITALS — BP 108/62 | HR 67 | Ht 69.5 in | Wt 162.2 lb

## 2019-11-07 DIAGNOSIS — Z853 Personal history of malignant neoplasm of breast: Secondary | ICD-10-CM | POA: Diagnosis not present

## 2019-11-07 DIAGNOSIS — N952 Postmenopausal atrophic vaginitis: Secondary | ICD-10-CM

## 2019-11-07 DIAGNOSIS — Z01419 Encounter for gynecological examination (general) (routine) without abnormal findings: Secondary | ICD-10-CM

## 2019-11-07 NOTE — Progress Notes (Signed)
65 y.o. [name] Married White or Caucasian Not Hispanic or Latino female here for annual exam. She had a face biopsy and is awaiting results.  She has vaginal dryness, not sexually active. No baseline pain.  H/O breast cancer.  No vaginal bleeding. No bowel or bladder c/o    Patient's last menstrual period was 03/29/2009.          Sexually active: No.  The current method of family planning is post menopausal status.    Exercising: Yes.    walking dance tennis Smoker:  no  Health Maintenance: Pap:  10/04/2018 WNL HPV Neg 09-27-17 neg History of abnormal Pap:  no MMG:  11/14/18 density B Bi-rads 2 benign, scheduled  BMD:  2019 normal.  Colonoscopy:  2019 WNL f/u 10 years  TDaP: thinks it was done a PCP   * reports that she has never smoked. She has never used smokeless tobacco. She reports current alcohol use of about 5.0 standard drinks of alcohol per week. She reports that she does not use drugs. Sons are 77 and 23, a senior in Technical brewer. She is a Training and development officer.   Past Medical History:  Diagnosis Date  . Abnormal Pap smear of cervix    yrs ago, just a repeat done  . Arthritis   . Breast cancer ([place])    Invasive ductal ca grade 1 er/pr positive, her2  negative  . Family history of cancer of mouth   . Family history of lung cancer   . Family history of prostate cancer   . Fatigue   . H/O colonoscopy 2003  . Hot flashes   . Insomnia   . Personal history of radiation therapy   . PONV (postoperative nausea and vomiting)   . Psoriasis   . Wears glasses     Past Surgical History:  Procedure Laterality Date  . BREAST LUMPECTOMY Left 11/01/2011   left breast lumpectomy  . BREAST SURGERY  10/1976   right breast benign tumor  . DILATION AND CURETTAGE OF UTERUS    . KNEE SURGERY  2005, 2007   bilateral   . LYMPH GLAND EXCISION  1978   right axillary  . [place]  . POLYPECTOMY    . TUMOR REMOVAL  1975   right breast    Current  Outpatient Medications  Medication Sig Dispense Refill  . calcipotriene-betamethasone (TACLONEX) ointment Apply 1 application topically daily as needed (psoriasis).     . Glucosamine-Chondroit-Vit C-Mn (GLUCOSAMINE 1500 COMPLEX PO) Take 2 tablets by mouth daily.    . JUBLIA 10 % SOLN Apply 1 application topically daily.    Marland Kitchen KERYDIN 5 % SOLN SMARTSIG:Sparingly Topical Daily    . Melatonin 5 MG TABS Take 5 mg by mouth at bedtime.    . Multiple Vitamin (MULTIVITAMIN WITH MINERALS) TABS tablet Take 1 tablet by mouth daily.    . naproxen sodium (ALEVE) 220 MG tablet Take 220 mg by mouth 2 (two) times daily as needed (pain).    Marland Kitchen OVER THE COUNTER MEDICATION Apply 1 application topically daily as needed (knee pain). CBD Cream     No current facility-administered medications for this visit.    Family History  Problem Relation Age of Onset  . Breast cancer Mother 27       Inflammatory breast cancer  . Heart attack Father   . Tongue cancer Brother 66  . Lung cancer Brother 4  . Prostate cancer Brother 33  not metastatic, treated with radiation  . Cancer - Other Sister 31       vulvar cancer  . Cancer Maternal Uncle        unknown cancer, diagnosed late 22s    Review of Systems  All other systems reviewed and are negative.   Exam:   BP 108/62   Pulse 67   Ht 5' 9.5" (1.765 m)   Wt 162 lb 3.2 oz (73.6 kg)   LMP 03/29/2009   SpO2 98%   BMI 23.61 kg/m   Weight change: @WEIGHTCHANGE @ Height:   Height: 5' 9.5" (176.5 cm)  Ht Readings from Last 3 Encounters:  11/07/19 5' 9.5" (1.765 m)  11/01/19 5' 9.5" (1.765 m)  10/31/18 5' 9.75" (1.772 m)    General appearance: alert, cooperative and appears stated age Head: Normocephalic, without obvious abnormality, atraumatic Neck: no adenopathy, supple, symmetrical, trachea midline and thyroid normal to inspection and palpation Lungs: clear to auscultation bilaterally Cardiovascular: regular rate and rhythm Breasts: normal  appearance, no masses or tenderness, evidence of left lumpectomy Abdomen: soft, non-tender; non distended,  no masses,  no organomegaly Extremities: extremities normal, atraumatic, no cyanosis or edema Skin: Skin color, texture, turgor normal. No rashes or lesions Lymph nodes: Cervical, supraclavicular, and axillary nodes normal. No abnormal inguinal nodes palpated Neurologic: Grossly normal   Pelvic: External genitalia:  no lesions              Urethra:  normal appearing urethra with no masses, tenderness or lesions              Bartholins and Skenes: normal                 Vagina: atrophic appearing vagina with normal color and discharge, no lesions              Cervix: no lesions               Bimanual Exam:  Uterus:  normal size, contour, position, consistency, mobility, non-tender and anteverted              Adnexa: no mass, fullness, tenderness               Rectovaginal: Confirms               Anus:  normal sphincter tone, no lesions  [name] chaperoned for the exam.  A:  Well Woman with normal exam  H/O breast cancer  Vaginal atrophy  P:   No pap this year  Labs with primary  Mammogram scheduled  DEXA UTD and normal  Colonoscopy UTD

## 2019-11-07 NOTE — Patient Instructions (Addendum)
EXERCISE AND DIET:  We recommended that you start or continue a regular exercise program for good health. Regular exercise means any activity that makes your heart beat faster and makes you sweat.  We recommend exercising at least 30 minutes per day at least 3 days a week, preferably 4 or 5.  We also recommend a diet low in fat and sugar.  Inactivity, poor dietary choices and obesity can cause diabetes, heart attack, stroke, and kidney damage, among others.    ALCOHOL AND SMOKING:  Women should limit their alcohol intake to no more than 7 drinks/beers/glasses of wine (combined, not each!) per week. Moderation of alcohol intake to this level decreases your risk of breast cancer and liver damage. And of course, no recreational drugs are part of a healthy lifestyle.  And absolutely no smoking or even second hand smoke. Most people know smoking can cause heart and lung diseases, but did you know it also contributes to weakening of your bones? Aging of your skin?  Yellowing of your teeth and nails?  CALCIUM AND VITAMIN D:  Adequate intake of calcium and Vitamin D are recommended.  The recommendations for exact amounts of these supplements seem to change often, but generally speaking 1,200 mg of calcium (between diet and supplement) and 800 units of Vitamin D per day seems prudent. Certain women may benefit from higher intake of Vitamin D.  If you are among these women, your doctor will have told you during your visit.    PAP SMEARS:  Pap smears, to check for cervical cancer or precancers,  have traditionally been done yearly, although recent scientific advances have shown that most women can have pap smears less often.  However, every woman still should have a physical exam from her gynecologist every year. It will include a breast check, inspection of the vulva and vagina to check for abnormal growths or skin changes, a visual exam of the cervix, and then an exam to evaluate the size and shape of the uterus and  ovaries.  And after 62 years of age, a rectal exam is indicated to check for rectal cancers. We will also provide age appropriate advice regarding health maintenance, like when you should have certain vaccines, screening for sexually transmitted diseases, bone density testing, colonoscopy, mammograms, etc.   MAMMOGRAMS:  All women over 40 years old should have a yearly mammogram. Many facilities now offer a "3D" mammogram, which may cost around $50 extra out of pocket. If possible,  we recommend you accept the option to have the 3D mammogram performed.  It both reduces the number of women who will be called back for extra views which then turn out to be normal, and it is better than the routine mammogram at detecting truly abnormal areas.    COLON CANCER SCREENING: Now recommend starting at age 45. At this time colonoscopy is not covered for routine screening until 50. There are take home tests that can be done between 45-49.   COLONOSCOPY:  Colonoscopy to screen for colon cancer is recommended for all women at age 50.  We know, you hate the idea of the prep.  We agree, BUT, having colon cancer and not knowing it is worse!!  Colon cancer so often starts as a polyp that can be seen and removed at colonscopy, which can quite literally save your life!  And if your first colonoscopy is normal and you have no family history of colon cancer, most women don't have to have it again for   10 years.  Once every ten years, you can do something that may end up saving your life, right?  We will be happy to help you get it scheduled when you are ready.  Be sure to check your insurance coverage so you understand how much it will cost.  It may be covered as a preventative service at no cost, but you should check your particular policy.      Breast Self-Awareness Breast self-awareness means being familiar with how your breasts look and feel. It involves checking your breasts regularly and reporting any changes to your  health care provider. Practicing breast self-awareness is important. A change in your breasts can be a sign of a serious medical problem. Being familiar with how your breasts look and feel allows you to find any problems early, when treatment is more likely to be successful. All women should practice breast self-awareness, including women who have had breast implants. How to do a breast self-exam One way to learn what is normal for your breasts and whether your breasts are changing is to do a breast self-exam. To do a breast self-exam: Look for Changes  1. Remove all the clothing above your waist. 2. Stand in front of a mirror in a room with good lighting. 3. Put your hands on your hips. 4. Push your hands firmly downward. 5. Compare your breasts in the mirror. Look for differences between them (asymmetry), such as: ? Differences in shape. ? Differences in size. ? Puckers, dips, and bumps in one breast and not the other. 6. Look at each breast for changes in your skin, such as: ? Redness. ? Scaly areas. 7. Look for changes in your nipples, such as: ? Discharge. ? Bleeding. ? Dimpling. ? Redness. ? A change in position. Feel for Changes Carefully feel your breasts for lumps and changes. It is best to do this while lying on your back on the floor and again while sitting or standing in the shower or tub with soapy water on your skin. Feel each breast in the following way:  Place the arm on the side of the breast you are examining above your head.  Feel your breast with the other hand.  Start in the nipple area and make  inch (2 cm) overlapping circles to feel your breast. Use the pads of your three middle fingers to do this. Apply light pressure, then medium pressure, then firm pressure. The light pressure will allow you to feel the tissue closest to the skin. The medium pressure will allow you to feel the tissue that is a little deeper. The firm pressure will allow you to feel the tissue  close to the ribs.  Continue the overlapping circles, moving downward over the breast until you feel your ribs below your breast.  Move one finger-width toward the center of the body. Continue to use the  inch (2 cm) overlapping circles to feel your breast as you move slowly up toward your collarbone.  Continue the up and down exam using all three pressures until you reach your armpit.  Write Down What You Find  Write down what is normal for each breast and any changes that you find. Keep a written record with breast changes or normal findings for each breast. By writing this information down, you do not need to depend only on memory for size, tenderness, or location. Write down where you are in your menstrual cycle, if you are still menstruating. If you are having trouble noticing differences   in your breasts, do not get discouraged. With time you will become more familiar with the variations in your breasts and more comfortable with the exam. How often should I examine my breasts? Examine your breasts every month. If you are breastfeeding, the best time to examine your breasts is after a feeding or after using a breast pump. If you menstruate, the best time to examine your breasts is 5-7 days after your period is over. During your period, your breasts are lumpier, and it may be more difficult to notice changes. When should I see my health care provider? See your health care provider if you notice:  A change in shape or size of your breasts or nipples.  A change in the skin of your breast or nipples, such as a reddened or scaly area.  Unusual discharge from your nipples.  A lump or thick area that was not there before.  Pain in your breasts.  Anything that concerns you.  Atrophic Vaginitis  Atrophic vaginitis is a condition in which the tissues that line the vagina become dry and thin. This condition is most common in women who have stopped having regular menstrual periods (are in  menopause). This usually starts when a woman is 45-55 years old. That is the time when a woman's estrogen levels begin to drop (decrease). Estrogen is a female hormone. It helps to keep the tissues of the vagina moist. It stimulates the vagina to produce a clear fluid that lubricates the vagina for sexual intercourse. This fluid also protects the vagina from infection. Lack of estrogen can cause the lining of the vagina to get thinner and dryer. The vagina may also shrink in size. It may become less elastic. Atrophic vaginitis tends to get worse over time as a woman's estrogen level drops. What are the causes? This condition is caused by the normal drop in estrogen that happens around the time of menopause. What increases the risk? Certain conditions or situations may lower a woman's estrogen level, leading to a higher risk for atrophic vaginitis. You are more likely to develop this condition if:  You are taking medicines that block estrogen.  You have had your ovaries removed.  You are being treated for cancer with X-ray (radiation) or medicines (chemotherapy).  You have given birth or are breastfeeding.  You are older than age 50.  You smoke. What are the signs or symptoms? Symptoms of this condition include:  Pain, soreness, or bleeding during sexual intercourse (dyspareunia).  Vaginal burning, irritation, or itching.  Pain or bleeding when a speculum is used in a vaginal exam (pelvic exam).  Having burning pain when passing urine.  Vaginal discharge that is brown or yellow. In some cases, there are no symptoms. How is this diagnosed? This condition is diagnosed by taking a medical history and doing a physical exam. This will include a pelvic exam that checks the vaginal tissues. Though rare, you may also have other tests, including:  A urine test.  A test that checks the acid balance in your vagina (acid balance test). How is this treated? Treatment for this condition  depends on how severe your symptoms are. Treatment may include:  Using an over-the-counter vaginal lubricant before sex.  Using a long-acting vaginal moisturizer.  Using low-dose vaginal estrogen for moderate to severe symptoms that do not respond to other treatments. Options include creams, tablets, and inserts (vaginal rings). Before you use a vaginal estrogen, tell your health care provider if you have a history of: ?   Breast cancer. ? Endometrial cancer. ? Blood clots. If you are not sexually active and your symptoms are very mild, you may not need treatment. Follow these instructions at home: Medicines  Take over-the-counter and prescription medicines only as told by your health care provider. Do not use herbal or alternative medicines unless your health care provider says that you can.  Use over-the-counter creams, lubricants, or moisturizers for dryness only as directed by your health care provider. General instructions  If your atrophic vaginitis is caused by menopause, discuss all of your menopause symptoms and treatment options with your health care provider.  Do not douche.  Do not use products that can make your vagina dry. These include: ? Scented feminine sprays. ? Scented tampons. ? Scented soaps.  Vaginal intercourse can help to improve blood flow and elasticity of vaginal tissue. If it hurts to have sex, try using a lubricant or moisturizer just before having intercourse. Contact a health care provider if:  Your discharge looks different than normal.  Your vagina has an unusual smell.  You have new symptoms.  Your symptoms do not improve with treatment.  Your symptoms get worse. Summary  Atrophic vaginitis is a condition in which the tissues that line the vagina become dry and thin. It is most common in women who have stopped having regular menstrual periods (are in menopause).  Treatment options include using vaginal lubricants and low-dose vaginal  estrogen.  Contact a health care provider if your vagina has an unusual smell, or if your symptoms get worse or do not improve after treatment. This information is not intended to replace advice given to you by your health care provider. Make sure you discuss any questions you have with your health care provider. Document Revised: 02/25/2017 Document Reviewed: 12/09/2016 Elsevier Patient Education  2020 [employer] Inc[employer]  

## 2019-11-15 ENCOUNTER — Other Ambulatory Visit: Payer: Self-pay

## 2019-11-15 ENCOUNTER — Ambulatory Visit
Admission: RE | Admit: 2019-11-15 | Discharge: 2019-11-15 | Disposition: A | Discharge status: Home / Self Care | Payer: 59 | Source: Ambulatory Visit | Attending: Adult Health | Admitting: Adult Health

## 2019-11-15 DIAGNOSIS — Z1231 Encounter for screening mammogram for malignant neoplasm of breast: Secondary | ICD-10-CM

## 2019-12-10 ENCOUNTER — Encounter: Payer: Self-pay | Admitting: Plastic Surgery

## 2019-12-10 ENCOUNTER — Ambulatory Visit: Payer: 59 | Admitting: Plastic Surgery

## 2019-12-10 ENCOUNTER — Other Ambulatory Visit: Payer: Self-pay

## 2019-12-10 VITALS — BP 109/63 | HR 74 | Temp 98.2°F

## 2019-12-10 DIAGNOSIS — C44212 Basal cell carcinoma of skin of right ear and external auricular canal: Secondary | ICD-10-CM

## 2019-12-10 NOTE — Progress Notes (Signed)
Referring Provider Shon Baton, MD [address] [place],  [place] [address]   CC: No chief complaint on file.     Jennifer Walker is an 64 y.o. female.  HPI: Patient presents with nonmelanoma skin cancer in the right preauricular area. She has been present for over a year. She initially thought it was psoriasis. It was biopsied and revealing her diagnosis. She has a planned Mohs excision about 6 weeks from now. She is here discuss reconstruction thereafter.  Allergies  Allergen Reactions  . Sulfa Antibiotics Other (See Comments)    Yeast infections and ulcers  . Sulfa Drugs Cross Reactors Other (See Comments)    Yeast infections and ulcers  . Tape Rash    Outpatient Encounter Medications as of 12/10/2019  Medication Sig  . calcipotriene-betamethasone (TACLONEX) ointment Apply 1 application topically daily as needed (psoriasis).   . Glucosamine-Chondroit-Vit C-Mn (GLUCOSAMINE 1500 COMPLEX PO) Take 2 tablets by mouth daily.  . JUBLIA 10 % SOLN Apply 1 application topically daily.  Marland Kitchen KERYDIN 5 % SOLN SMARTSIG:Sparingly Topical Daily  . Melatonin 5 MG TABS Take 5 mg by mouth at bedtime.  . Multiple Vitamin (MULTIVITAMIN WITH MINERALS) TABS tablet Take 1 tablet by mouth daily.  . naproxen sodium (ALEVE) 220 MG tablet Take 220 mg by mouth 2 (two) times daily as needed (pain).  Marland Kitchen OVER THE COUNTER MEDICATION Apply 1 application topically daily as needed (knee pain). CBD Cream   No facility-administered encounter medications on file as of 12/10/2019.     Past Medical History:  Diagnosis Date  . Abnormal Pap smear of cervix    yrs ago, just a repeat done  . Arthritis   . Breast cancer ([place])    Invasive ductal ca grade 1 er/pr positive, her2  negative  . Family history of cancer of mouth   . Family history of lung cancer   . Family history of prostate cancer   . Fatigue   . H/O colonoscopy 2003  . Hot flashes   . Insomnia   . Personal history of radiation therapy     . PONV (postoperative nausea and vomiting)   . Psoriasis   . Wears glasses     Past Surgical History:  Procedure Laterality Date  . BREAST LUMPECTOMY Left 11/01/2011   left breast lumpectomy  . BREAST SURGERY  10/1976   right breast benign tumor  . DILATION AND CURETTAGE OF UTERUS    . KNEE SURGERY  2005, 2007   bilateral   . LYMPH GLAND EXCISION  1978   right axillary  . [place]  . POLYPECTOMY    . TUMOR REMOVAL  1975   right breast    Family History  Problem Relation Age of Onset  . Breast cancer Mother 52       Inflammatory breast cancer  . Heart attack Father   . Tongue cancer Brother 94  . Lung cancer Brother 63  . Prostate cancer Brother 84       not metastatic, treated with radiation  . Cancer - Other Sister 63       vulvar cancer  . Cancer Maternal Uncle        unknown cancer, diagnosed late 44s    Social History   Social History Narrative  . Not on file     Review of Systems General: Denies fevers, chills, weight loss CV: Denies chest pain, shortness of breath, palpitations  Physical Exam Vitals with BMI 12/10/2019 11/07/2019 11/01/2019  Height - 5' 9.5" 5' 9.5"  Weight - 162 lbs 3 oz 163 lbs  BMI - 32.20 25.42  Systolic 706 237 628  Diastolic 63 62 55  Pulse 74 67 65    General:  No acute distress,  Alert and oriented, Non-Toxic, Normal speech and affect She has a 3 to 4 cm diameter area of erythema in the right preauricular area. This does extend onto her earlobe a little bit. There is ample skin laxity in that area. I do not see any obvious surrounding scars. Cranial nerves appear grossly intact.  Assessment/Plan Patient presents with nonmelanoma skin cancer in the right preauricular area. I suspect this area would be able to be close by using local tissues. There is the potential for a significant portion of her earlobe to be involved and I explained that that could also be treated with adjacent tissue. We will plan to have time  set aside to close this in the office the day of her procedure. If it is more complex or she is unable to tolerate it then we would have to then plan for the operating room. I discussed the risks of the procedure with her that include bleeding, infection, damage surrounding structures and need for additional procedures. The patient was understanding we will plan to coordinate this with her Mohs surgeon.  Cindra Presume 12/10/2019, 3:01 PM

## 2020-02-06 ENCOUNTER — Other Ambulatory Visit: Payer: Self-pay

## 2020-02-06 ENCOUNTER — Ambulatory Visit: Payer: 59 | Admitting: Plastic Surgery

## 2020-02-06 ENCOUNTER — Encounter: Payer: Self-pay | Admitting: Plastic Surgery

## 2020-02-06 VITALS — BP 116/69 | HR 63 | Temp 98.4°F | Ht 69.5 in | Wt 160.0 lb

## 2020-02-06 DIAGNOSIS — C44212 Basal cell carcinoma of skin of right ear and external auricular canal: Secondary | ICD-10-CM

## 2020-02-06 NOTE — Progress Notes (Signed)
Operative Note   DATE OF OPERATION: 02/06/2020  SURGICAL DEPARTMENT: Plastic Surgery  PREOPERATIVE DIAGNOSES: Right cheek and earlobe Mohs surgery defect  POSTOPERATIVE DIAGNOSES:  same  PROCEDURE: Complex closure right cheek totaling 7 cm in length  SURGEON: Talmadge Coventry, MD  ASSISTANT: None  ANESTHESIA: Local  COMPLICATIONS: None.   INDICATIONS FOR PROCEDURE:  The patient, Jennifer Walker is a 64 y.o. female born on 05/26/1955, is here for treatment of right cheek Mohs surgery defect involving the earlobe as well MRN: [id number]  CONSENT:  Informed consent was obtained directly from the patient. Risks, benefits and alternatives were fully discussed. Specific risks including but not limited to bleeding, infection, hematoma, seroma, scarring, pain, contracture, asymmetry, wound healing problems, and need for further surgery were all discussed. The patient did have an ample opportunity to have questions answered to satisfaction.   DESCRIPTION OF PROCEDURE:  I started by evaluating the wound.  This was an elliptical defect in the right preauricular area.  It looked like it went down to but did not violate parotid fascia centrally.  The defect did extend onto the anterior aspect of the earlobe for a centimeter or so.  She did appear to be animating fairly symmetrically prior to my additional local injection.  I injected about 10 cc of lidocaine with epinephrine circumferentially and this was given time to work.  The area was then prepped and draped in standard fashion timeout was performed.  I performed an excision superiorly and inferiorly to take out the standing cutaneous deformity.  I then undermined the skin circumferentially and closed this with a combination of interrupted buried 4-0 Monocryl sutures and a running 5-0 fast gut.  I did need to tailor the earlobe a bit in a T fashion which was closed as well.  This was to avoid a pulling abnormality to the bottom of the  earlobe.  This gave her a nice on table result and she was covered with a soft dressing.

## 2020-02-20 ENCOUNTER — Encounter: Payer: Self-pay | Admitting: Plastic Surgery

## 2020-02-20 ENCOUNTER — Other Ambulatory Visit: Payer: Self-pay

## 2020-02-20 ENCOUNTER — Ambulatory Visit (INDEPENDENT_AMBULATORY_CARE_PROVIDER_SITE_OTHER): Payer: 59 | Admitting: Plastic Surgery

## 2020-02-20 DIAGNOSIS — C44212 Basal cell carcinoma of skin of right ear and external auricular canal: Secondary | ICD-10-CM

## 2020-02-20 NOTE — Progress Notes (Signed)
Patient is a 64 year old female here for follow-up after undergoing complex closure of right cheek and earlobe on 02/06/2020 with Dr. Claudia Desanctis.  She had Mohs surgery causing the defect of the right cheek and earlobe.  ~ 2 weeks PO Patient reports she doesn't like the way it looks.  She especially doesn't like the earlobe.  Reports earlobe had gotten red until she was put on Cipro for UTI and then it improved.  Denies fever, shills, nausea, vomiting, and pain. Incision is healing very nicely, C/D/I. No signs of infection or drainage.  Suture knots removed today.  She may apply Vaseline for moisture to the incisions.  She should wait 3 months before repiercing the ear.  Recommend giving incision a little more time to heal to see final appearance.  Follow-up in 4 weeks.  Call office with any questions/concerns.

## 2020-03-19 ENCOUNTER — Ambulatory Visit (INDEPENDENT_AMBULATORY_CARE_PROVIDER_SITE_OTHER): Payer: 59 | Admitting: Plastic Surgery

## 2020-03-19 ENCOUNTER — Other Ambulatory Visit: Payer: Self-pay

## 2020-03-19 ENCOUNTER — Encounter: Payer: Self-pay | Admitting: Plastic Surgery

## 2020-03-19 VITALS — BP 104/65 | HR 75 | Temp 99.0°F

## 2020-03-19 DIAGNOSIS — C44212 Basal cell carcinoma of skin of right ear and external auricular canal: Secondary | ICD-10-CM

## 2020-03-19 NOTE — Progress Notes (Signed)
Patient presents about 5 weeks out from complex closure of right cheek and earlobe defect after Mohs surgery.  She is not totally happy with the result and feels like her ear lobe shape is changed and pointer than the other side.  On my exam it looks like she has a great result for this point in time.  The preauricular auricular scar is fading nicely.  The earlobe shape is slightly different than the other side but it seems to have a smooth contour to me.  The Mohs defect did extend onto a significant portion of the earlobe which affected the shape a bit.  I discussed with the patient my view of the outcome and she is content with letting this continue to heal and mature.  I offered to see her again at any point if she would like to discuss the results with the scarring.  All of her questions were answered.  We will see her again on an as-needed basis.

## 2020-10-29 ENCOUNTER — Other Ambulatory Visit: Payer: Self-pay | Admitting: Adult Health

## 2020-10-30 ENCOUNTER — Other Ambulatory Visit: Payer: Self-pay | Admitting: Adult Health

## 2020-10-30 DIAGNOSIS — Z1231 Encounter for screening mammogram for malignant neoplasm of breast: Secondary | ICD-10-CM

## 2020-10-31 ENCOUNTER — Other Ambulatory Visit: Payer: Self-pay | Admitting: Internal Medicine

## 2020-10-31 ENCOUNTER — Other Ambulatory Visit: Payer: Self-pay | Admitting: Adult Health

## 2020-10-31 DIAGNOSIS — N644 Mastodynia: Secondary | ICD-10-CM

## 2020-10-31 DIAGNOSIS — Z853 Personal history of malignant neoplasm of breast: Secondary | ICD-10-CM

## 2020-11-10 ENCOUNTER — Other Ambulatory Visit: Payer: Self-pay

## 2020-11-10 ENCOUNTER — Encounter: Payer: Self-pay | Admitting: Obstetrics and Gynecology

## 2020-11-10 ENCOUNTER — Ambulatory Visit (INDEPENDENT_AMBULATORY_CARE_PROVIDER_SITE_OTHER): Payer: 59 | Admitting: Obstetrics and Gynecology

## 2020-11-10 VITALS — BP 110/64 | HR 65 | Ht 70.0 in | Wt 171.0 lb

## 2020-11-10 DIAGNOSIS — Z853 Personal history of malignant neoplasm of breast: Secondary | ICD-10-CM

## 2020-11-10 DIAGNOSIS — N952 Postmenopausal atrophic vaginitis: Secondary | ICD-10-CM | POA: Diagnosis not present

## 2020-11-10 DIAGNOSIS — Z01419 Encounter for gynecological examination (general) (routine) without abnormal findings: Secondary | ICD-10-CM | POA: Diagnosis not present

## 2020-11-10 NOTE — Patient Instructions (Signed)

## 2020-11-10 NOTE — Progress Notes (Signed)
65 y.o. [id number] Married White or Caucasian Not Hispanic or Latino female here for annual exam.  No vaginal bleeding. Not sexually active secondary vaginal atrophy.   Had had some left breast burning. The burning has been intermittent for he last few months. It is a mild tingling/burning. She is scheduled for diagnostic breast imaging in a few weeks. H/O left breast cancer, she had similar sensations prior to her breast cancer diagnosis.   No bowel or bladder c/o.  Patient's last menstrual period was 03/29/2009.          Sexually active: No.  The current method of family planning is post menopausal status.    Exercising: Yes.     Cardio and tennis  Smoker:  no  Health Maintenance: Pap:  10/04/2018 WNL HPV Neg 09-27-17 neg History of abnormal Pap:  no MMG:  11/15/19 density B Bi-rads 2 benign scheduled 12/05/2020 BMD:   2019 normal  Colonoscopy: 2019 WNL f/u 10 years  TDaP:  thinks she had it done with her pcp   reports that she has never smoked. She has never used smokeless tobacco. She reports current alcohol use of about 5.0 standard drinks per week. She reports that she does not use drugs. 1 drink a day in general. 2 grown sons (6 and 16). Oldest is starting his last year of law school. She is a Training and development officer.   Past Medical History:  Diagnosis Date   Abnormal Pap smear of cervix    yrs ago, just a repeat done   Arthritis    Breast cancer ([place])    Invasive ductal ca grade 1 er/pr positive, her2  negative   Family history of cancer of mouth    Family history of lung cancer    Family history of prostate cancer    Fatigue    H/O colonoscopy 2003   Hot flashes    Insomnia    Personal history of radiation therapy    PONV (postoperative nausea and vomiting)    Psoriasis    Wears glasses     Past Surgical History:  Procedure Laterality Date   BREAST LUMPECTOMY Left 11/01/2011   left breast lumpectomy   BREAST SURGERY  10/1976   right breast benign tumor   [place]  2005, 2007   bilateral    LYMPH GLAND EXCISION  1978   right axillary   MANDIBLE SURGERY  1982   POLYPECTOMY     TUMOR REMOVAL  1975   right breast    Current Outpatient Medications  Medication Sig Dispense Refill   calcipotriene-betamethasone (TACLONEX) ointment Apply 1 application topically daily as needed (psoriasis).      Glucosamine-Chondroit-Vit C-Mn (GLUCOSAMINE 1500 COMPLEX PO) Take 2 tablets by mouth daily.     JUBLIA 10 % SOLN Apply 1 application topically daily.     KERYDIN 5 % SOLN SMARTSIG:Sparingly Topical Daily     Melatonin 5 MG TABS Take 5 mg by mouth at bedtime.     Multiple Vitamin (MULTIVITAMIN WITH MINERALS) TABS tablet Take 1 tablet by mouth daily.     naproxen sodium (ALEVE) 220 MG tablet Take 220 mg by mouth 2 (two) times daily as needed (pain).     nitrofurantoin, macrocrystal-monohydrate, (MACROBID) 100 MG capsule Take 100 mg by mouth 2 (two) times daily.     OVER THE COUNTER MEDICATION Apply 1 application topically daily as needed (knee pain). CBD Cream     No  current facility-administered medications for this visit.    Family History  Problem Relation Age of Onset   Breast cancer Mother 8       Inflammatory breast cancer   Heart attack Father    Tongue cancer Brother 75   Lung cancer Brother 68   Prostate cancer Brother 18       not metastatic, treated with radiation   Cancer - Other Sister 43       vulvar cancer   Cancer Maternal Uncle        unknown cancer, diagnosed late 69s    Review of Systems  Genitourinary:        Breast pain  All other systems reviewed and are negative.  Exam:   BP 110/64   Pulse 65   Ht 5' 10"  (1.778 m)   Wt 171 lb (77.6 kg)   LMP 03/29/2009   SpO2 99%   BMI 24.54 kg/m   Weight change: @WEIGHTCHANGE @ Height:   Height: 5' 10"  (177.8 cm)  Ht Readings from Last 3 Encounters:  11/10/20 5' 10"  (1.778 m)  02/06/20 5' 9.5" (1.765 m)  11/07/19 5' 9.5" (1.765 m)     General appearance: alert, cooperative and appears stated age Head: Normocephalic, without obvious abnormality, atraumatic Neck: no adenopathy, supple, symmetrical, trachea midline and thyroid normal to inspection and palpation Lungs: clear to auscultation bilaterally Cardiovascular: regular rate and rhythm Breasts:  evidence of left lumpectomy, in the right breast at 9:30, near the periphery of the breast is a 4-5 mm, smooth round, mobile lump. This is a known lymph node from prior imaging (smaller today) Abdomen: soft, non-tender; non distended,  no masses,  no organomegaly Extremities: extremities normal, atraumatic, no cyanosis or edema Skin: Skin color, texture, turgor normal. No rashes or lesions Lymph nodes: Cervical, supraclavicular, and axillary nodes normal. No abnormal inguinal nodes palpated Neurologic: Grossly normal   Pelvic: External genitalia:  no lesions              Urethra:  normal appearing urethra with no masses, tenderness or lesions              Bartholins and Skenes: normal                 Vagina: atrophic appearing vagina with normal color and discharge, no lesions              Cervix: no lesions               Bimanual Exam:  Uterus:  normal size, contour, position, consistency, mobility, non-tender and anteverted              Adnexa: no mass, fullness, tenderness               Rectovaginal: Confirms               Anus:  normal sphincter tone, no lesions   1. Well woman exam No pap this year Discussed breast self exam Discussed calcium and vit D intake Labs with primary Colonoscopy is UTD  2. History of breast cancer She has a diagnostic mammogram and ultrasound scheduled  3. Vaginal atrophy

## 2020-12-05 ENCOUNTER — Ambulatory Visit
Admission: RE | Admit: 2020-12-05 | Discharge: 2020-12-05 | Disposition: A | Discharge status: Home / Self Care | Payer: 59 | Source: Ambulatory Visit | Attending: Internal Medicine | Admitting: Internal Medicine

## 2020-12-05 ENCOUNTER — Other Ambulatory Visit: Payer: Self-pay

## 2020-12-05 DIAGNOSIS — Z853 Personal history of malignant neoplasm of breast: Secondary | ICD-10-CM

## 2020-12-05 DIAGNOSIS — N644 Mastodynia: Secondary | ICD-10-CM

## 2021-11-17 ENCOUNTER — Other Ambulatory Visit: Payer: Self-pay | Admitting: Internal Medicine

## 2021-11-17 DIAGNOSIS — Z1231 Encounter for screening mammogram for malignant neoplasm of breast: Secondary | ICD-10-CM

## 2021-12-09 ENCOUNTER — Ambulatory Visit
Admission: RE | Admit: 2021-12-09 | Discharge: 2021-12-09 | Disposition: A | Discharge status: Home / Self Care | Payer: No Typology Code available for payment source | Source: Ambulatory Visit | Attending: Internal Medicine | Admitting: Internal Medicine

## 2021-12-09 DIAGNOSIS — Z1231 Encounter for screening mammogram for malignant neoplasm of breast: Secondary | ICD-10-CM

## 2022-01-25 NOTE — Progress Notes (Signed)
66 y.o. [id number] Married White or Caucasian Not Hispanic or Latino female here for annual exam.  Not sexually active.   Dr Virgina Jock has prescribed macrobid for prn use. She has a h/o recurrent UTI's. She takes it if she feels a UTI coming on.    She c/o focal vulvar itching for several months. She is using her psoriasis medication (Zoryve-not steroid) on the vulva . Symptoms improved, not resolved.    H/O left breast cancer.   She is having knee pain, see's an Orthopedic Surgeon next week.   Patient's last menstrual period was 03/29/2009.          Sexually active: No.  The current method of family planning is post menopausal status.    Exercising: Yes.     Tennis  Smoker:  none   Health Maintenance: Pap: 10/04/2018 WNL HPV Neg 09-27-17 neg History of abnormal Pap:  no MMG:  12/10/21 density C Bi-rads 1 neg  BMD:  2019 normal  Colonoscopy: 2019 WNL f/u 10 years  TDaP:  thinks she had it done with her pcp Gardasil: n/a   reports that she has never smoked. She has never used smokeless tobacco. She reports current alcohol use of about 5.0 standard drinks of alcohol per week. She reports that she does not use drugs. Drinks one glass of wine a night. She is a Training and development officer.  She has 2 grown sons. Both are at home, one is an Radiographer, therapeutic, other is in IT.  Past Medical History:  Diagnosis Date   Abnormal Pap smear of cervix    yrs ago, just a repeat done   Arthritis    Breast cancer ([place])    Invasive ductal ca grade 1 er/pr positive, her2  negative   Family history of cancer of mouth    Family history of lung cancer    Family history of prostate cancer    Fatigue    H/O colonoscopy 2003   Hot flashes    Insomnia    Personal history of radiation therapy    PONV (postoperative nausea and vomiting)    Psoriasis    Wears glasses     Past Surgical History:  Procedure Laterality Date   BREAST LUMPECTOMY Left 11/01/2011   left breast lumpectomy   BREAST SURGERY   10/1976   right breast benign tumor   [place]  2005, 2007   bilateral    LYMPH GLAND EXCISION  1978   right axillary   MANDIBLE SURGERY  1982   POLYPECTOMY     TUMOR REMOVAL  1975   right breast    Current Outpatient Medications  Medication Sig Dispense Refill   calcipotriene-betamethasone (TACLONEX) ointment Apply 1 application topically daily as needed (psoriasis).      JUBLIA 10 % SOLN Apply 1 application topically daily.     KERYDIN 5 % SOLN SMARTSIG:Sparingly Topical Daily     Melatonin 5 MG TABS Take 5 mg by mouth at bedtime.     Multiple Vitamin (MULTIVITAMIN WITH MINERALS) TABS tablet Take 1 tablet by mouth daily.     naproxen sodium (ALEVE) 220 MG tablet Take 220 mg by mouth 2 (two) times daily as needed (pain).     nitrofurantoin, macrocrystal-monohydrate, (MACROBID) 100 MG capsule Take 100 mg by mouth 2 (two) times daily.     OVER THE COUNTER MEDICATION Apply 1 application topically daily as needed (knee pain). CBD Cream  Glucosamine-Chondroit-Vit C-Mn (GLUCOSAMINE 1500 COMPLEX PO) Take 2 tablets by mouth daily. (Patient not taking: Reported on 02/01/2022)     No current facility-administered medications for this visit.    Family History  Problem Relation Age of Onset   Breast cancer Mother 30       Inflammatory breast cancer   Heart attack Father    Tongue cancer Brother 58   Lung cancer Brother 84   Prostate cancer Brother 37       not metastatic, treated with radiation   Cancer - Other Sister 52       vulvar cancer   Cancer Maternal Uncle        unknown cancer, diagnosed late 59s    Review of Systems  All other systems reviewed and are negative.   Exam:   BP 122/68   Pulse 88   Ht 5' 9.69" (1.77 m)   Wt 175 lb (79.4 kg)   LMP 03/29/2009   SpO2 100%   BMI 25.34 kg/m   Weight change: _0 @ Height:   Height: 5' 9.69" (177 cm)  Ht Readings from Last 3 Encounters:  02/01/22 5' 9.69" (1.77 m)   11/10/20 _1  (1.778 m)  02/06/20 5' 9.5" (1.765 m)    General appearance: alert, cooperative and appears stated age Head: Normocephalic, without obvious abnormality, atraumatic Neck: no adenopathy, supple, symmetrical, trachea midline and thyroid normal to inspection and palpation Lungs: clear to auscultation bilaterally Cardiovascular: regular rate and rhythm Breasts:  evidence of left breast biopsy, stable pea sized lump at the periphery of the right breast at 9:30 (known lymph node). Abdomen: soft, non-tender; non distended,  no masses,  no organomegaly Extremities: extremities normal, atraumatic, no cyanosis or edema Skin: Skin color, texture, turgor normal. No rashes or lesions Lymph nodes: Cervical, supraclavicular, and axillary nodes normal. No abnormal inguinal nodes palpated Neurologic: Grossly normal   Pelvic: External genitalia:  slight irritation at the top of the labia majora, no lesions.               Urethra:  normal appearing urethra with no masses, tenderness or lesions              Bartholins and Skenes: normal                 Vagina: atrophic appearing vagina with normal color and discharge, no lesions              Cervix: no lesions               Bimanual Exam:  Uterus:  normal size, contour, position, consistency, mobility, non-tender              Adnexa: no mass, fullness, tenderness               Rectovaginal: Confirms               Anus:  normal sphincter tone, no lesions    1. Well woman exam No pap this year Mammogram UTD Colonoscopy UTD Labs and immunizations with prirmary Discussed breast self exam Discussed calcium and vit D intake  2. Vulvar irritation No concerning findings on exam. Focal area of irritation  - betamethasone valerate ointment (VALISONE) 0.1 %; Apply 1 Application topically 2 (two) times daily. Use for up to 2 weeks as needed.  Dispense: 30 g; Refill: 0

## 2022-01-27 ENCOUNTER — Ambulatory Visit: Payer: 59 | Admitting: Obstetrics and Gynecology

## 2022-02-01 ENCOUNTER — Ambulatory Visit (INDEPENDENT_AMBULATORY_CARE_PROVIDER_SITE_OTHER): Payer: No Typology Code available for payment source | Admitting: Obstetrics and Gynecology

## 2022-02-01 ENCOUNTER — Encounter: Payer: Self-pay | Admitting: Obstetrics and Gynecology

## 2022-02-01 VITALS — BP 122/68 | HR 88 | Ht 69.69 in | Wt 175.0 lb

## 2022-02-01 DIAGNOSIS — N9089 Other specified noninflammatory disorders of vulva and perineum: Secondary | ICD-10-CM | POA: Diagnosis not present

## 2022-02-01 DIAGNOSIS — Z01419 Encounter for gynecological examination (general) (routine) without abnormal findings: Secondary | ICD-10-CM

## 2022-02-01 MED ORDER — BETAMETHASONE VALERATE 0.1 % EX OINT: 1.0000 | TOPICAL_OINTMENT | Freq: Two times a day (BID) | CUTANEOUS | 0 refills | Status: DC

## 2022-02-01 NOTE — Patient Instructions (Signed)

## 2022-04-30 NOTE — Patient Instructions (Signed)
DUE TO COVID-19 ONLY TWO VISITORS  (aged 65 and older)  ARE ALLOWED TO COME WITH YOU AND STAY IN THE WAITING ROOM ONLY DURING PRE OP AND PROCEDURE.   **NO VISITORS ARE ALLOWED IN THE SHORT STAY AREA OR RECOVERY ROOM!!**  IF YOU WILL BE ADMITTED INTO THE HOSPITAL YOU ARE ALLOWED ONLY FOUR SUPPORT PEOPLE DURING VISITATION HOURS ONLY (7 AM -8PM)   The support person(s) must pass our screening, gel in and out, and wear a mask at all times, including in the patient's room. Patients must also wear a mask when staff or their support person are in the room. Visitors GUEST BADGE MUST BE WORN VISIBLY  One adult visitor may remain with you overnight and MUST be in the room by 8 P.M.     Your procedure is scheduled on: 05/17/22   Report to [facility] Main Entrance    Report to admitting at  9:05 AM   Call this number if you have problems the morning of surgery [phone]   Do not eat food :After Midnight.   After Midnight you may have the following liquids until _8:30_ AM/ DAY OF SURGERY  Water Black Coffee (sugar ok, NO MILK/CREAM OR CREAMERS)  Tea (sugar ok, NO MILK/CREAM OR CREAMERS) regular and decaf                             Plain Jell-O (NO RED)                                           Fruit ices (not with fruit pulp, NO RED)                                     Popsicles (NO RED)                                                                  Juice: apple, WHITE grape, WHITE cranberry Sports drinks like Gatorade (NO RED)     The day of surgery:  Drink ONE (1) Pre-Surgery Clear Ensure  at  8:15 AM the morning of surgery. Drink in one sitting. Do not sip.  This drink was given to you during your hospital  pre-op appointment visit. Nothing else to drink after completing the  Pre-Surgery Clear Ensure at 8:30 AM          If you have questions, please contact your surgeon's office.   Oral Hygiene is also important to reduce your risk of infection.                                     Remember - BRUSH YOUR TEETH THE MORNING OF SURGERY WITH YOUR REGULAR TOOTHPASTE  DENTURES WILL BE REMOVED PRIOR TO SURGERY PLEASE DO NOT APPLY "Poly grip" OR ADHESIVES!!!   Do NOT smoke after Midnight   Take these medicines the morning of surgery with A SIP OF WATER: none   Bring CPAP  mask and tubing day of surgery.                              You may not have any metal on your body including hair pins, jewelry, and body piercing             Do not wear make-up, lotions, powders, perfumes, or deodorant  Do not wear nail polish including gel and S&S, artificial/acrylic nails, or any other type of covering on natural nails including finger and toenails. If you have artificial nails, gel coating, etc. that needs to be removed by a nail salon please have this removed prior to surgery or surgery may need to be canceled/ delayed if the surgeon/ anesthesia feels like they are unable to be safely monitored.   Do not shave  48 hours prior to surgery.     Do not bring valuables to the hospital. [place].   Contacts, glasses, or bridgework may not be worn into surgery.   Bring small overnight bag day of surgery.   DO NOT [place]. PHARMACY WILL DISPENSE MEDICATIONS LISTED ON YOUR MEDICATION LIST TO YOU DURING YOUR ADMISSION [place]!       Special Instructions: Bring a copy of your healthcare power of attorney and living will documents  the day of surgery if you haven't scanned them before.              Please read over the following fact sheets you were given: IF YOU HAVE QUESTIONS ABOUT YOUR PRE-OP INSTRUCTIONS PLEASE CALL [phone]    [facility] - Preparing for Surgery Before surgery, you can play an important role.  Because skin is not sterile, your skin needs to be as free of germs as possible.  You can reduce the number of germs on your skin by washing with CHG (chlorahexidine  gluconate) soap before surgery.  CHG is an antiseptic cleaner which kills germs and bonds with the skin to continue killing germs even after washing. Please DO NOT use if you have an allergy to CHG or antibacterial soaps.  If your skin becomes reddened/irritated stop using the CHG and inform your nurse when you arrive at Short Stay. Do not shave (including legs and underarms) for at least 48 hours prior to the first CHG shower.   Please follow these instructions carefully:  1.  Shower with CHG Soap the night before surgery and the  morning of Surgery.  2.  If you choose to wash your hair, wash your hair first as usual with your  normal  shampoo.  3.  After you shampoo, rinse your hair and body thoroughly to remove the  shampoo.                            4.  Use CHG as you would any other liquid soap.  You can apply chg directly  to the skin and wash                       Gently with a scrungie or clean washcloth.  5.  Apply the CHG Soap to your body ONLY FROM THE NECK DOWN.   Do not use on face/ open  Wound or open sores. Avoid contact with eyes, ears mouth and genitals (private parts).                       Wash face,  Genitals (private parts) with your normal soap.             6.  Wash thoroughly, paying special attention to the area where your surgery  will be performed.  7.  Thoroughly rinse your body with warm water from the neck down.  8.  DO NOT shower/wash with your normal soap after using and rinsing off  the CHG Soap.             9.  Pat yourself dry with a clean towel.            10.  Wear clean pajamas.            11.  Place clean sheets on your bed the night of your first shower and do not  sleep with pets. Day of Surgery : Do not apply any lotions/deodorants the morning of surgery.  Please wear clean clothes to the hospital/surgery center.  FAILURE TO FOLLOW THESE INSTRUCTIONS MAY RESULT IN THE CANCELLATION OF YOUR  SURGERY     ________________________________________________________________________  Incentive Spirometer  An incentive spirometer is a tool that can help keep your lungs clear and active. This tool measures how well you are filling your lungs with each breath. Taking long deep breaths may help reverse or decrease the chance of developing breathing (pulmonary) problems (especially infection) following: A long period of time when you are unable to move or be active. BEFORE THE PROCEDURE  If the spirometer includes an indicator to show your best effort, your nurse or respiratory therapist will set it to a desired goal. If possible, sit up straight or lean slightly forward. Try not to slouch. Hold the incentive spirometer in an upright position. INSTRUCTIONS FOR USE  Sit on the edge of your bed if possible, or sit up as far as you can in bed or on a chair. Hold the incentive spirometer in an upright position. Breathe out normally. Place the mouthpiece in your mouth and seal your lips tightly around it. Breathe in slowly and as deeply as possible, raising the piston or the ball toward the top of the column. Hold your breath for 3-5 seconds or for as long as possible. Allow the piston or ball to fall to the bottom of the column. Remove the mouthpiece from your mouth and breathe out normally. Rest for a few seconds and repeat Steps 1 through 7 at least 10 times every 1-2 hours when you are awake. Take your time and take a few normal breaths between deep breaths. The spirometer may include an indicator to show your best effort. Use the indicator as a goal to work toward during each repetition. After each set of 10 deep breaths, practice coughing to be sure your lungs are clear. If you have an incision (the cut made at the time of surgery), support your incision when coughing by placing a pillow or rolled up towels firmly against it. Once you are able to get out of bed, walk around indoors and  cough well. You may stop using the incentive spirometer when instructed by your caregiver.  RISKS AND COMPLICATIONS Take your time so you do not get dizzy or light-headed. If you are in pain, you may need to take or ask for pain medication before doing  incentive spirometry. It is harder to take a deep breath if you are having pain. AFTER USE Rest and breathe slowly and easily. It can be helpful to keep track of a log of your progress. Your caregiver can provide you with a simple table to help with this. If you are using the spirometer at home, follow these instructions: [place] IF:  You are having difficultly using the spirometer. You have trouble using the spirometer as often as instructed. Your pain medication is not giving enough relief while using the spirometer. You develop fever of 100.5 F (38.1 C) or higher. SEEK IMMEDIATE MEDICAL CARE IF:  You cough up bloody sputum that had not been present before. You develop fever of 102 F (38.9 C) or greater. You develop worsening pain at or near the incision site. MAKE SURE YOU:  Understand these instructions. Will watch your condition. Will get help right away if you are not doing well or get worse. Document Released: 07/26/2006 Document Revised: 06/07/2011 Document Reviewed: 09/26/2006 [facility] Patient Information 2014 [place], [place].   ________________________________________________________________________

## 2022-05-05 ENCOUNTER — Other Ambulatory Visit: Payer: Self-pay

## 2022-05-05 ENCOUNTER — Encounter (HOSPITAL_COMMUNITY)
Admission: RE | Admit: 2022-05-05 | Discharge: 2022-05-05 | Disposition: A | Discharge status: Home / Self Care | Payer: No Typology Code available for payment source | Source: Ambulatory Visit | Attending: Orthopedic Surgery | Admitting: Orthopedic Surgery

## 2022-05-05 ENCOUNTER — Encounter (HOSPITAL_COMMUNITY): Payer: Self-pay

## 2022-05-05 DIAGNOSIS — Z01812 Encounter for preprocedural laboratory examination: Secondary | ICD-10-CM | POA: Diagnosis not present

## 2022-05-05 DIAGNOSIS — Z01818 Encounter for other preprocedural examination: Secondary | ICD-10-CM

## 2022-05-05 LAB — BASIC METABOLIC PANEL
Anion gap: 12 (ref 5–15)
BUN: 15 mg/dL (ref 8–23)
CO2: 26 mmol/L (ref 22–32)
Calcium: 9.2 mg/dL (ref 8.9–10.3)
Chloride: 102 mmol/L (ref 98–111)
Creatinine, Ser: 0.87 mg/dL (ref 0.44–1.00)
GFR, Estimated: >60 mL/min (ref >60)
Glucose, Bld: 107 mg/dL — ABNORMAL HIGH (ref 70–99)
Potassium: 4.3 mmol/L (ref 3.5–5.1)
Sodium: 140 mmol/L (ref 135–145)

## 2022-05-05 LAB — CBC
HCT: 41.1 % (ref 36.0–46.0)
Hemoglobin: 13.3 g/dL (ref 12.0–15.0)
MCH: 31.2 pg (ref 26.0–34.0)
MCHC: 32.4 g/dL (ref 30.0–36.0)
MCV: 96.5 fL (ref 80.0–100.0)
Platelets: 278 10*3/uL (ref 150–400)
RBC: 4.26 MIL/uL (ref 3.87–5.11)
RDW: 12.1 % (ref 11.5–15.5)
WBC: 4.4 10*3/uL (ref 4.0–10.5)
nRBC: 0 % (ref 0.0–0.2)

## 2022-05-05 LAB — SURGICAL PCR SCREEN
MRSA, PCR: NEGATIVE
Staphylococcus aureus: POSITIVE — AB

## 2022-05-05 NOTE — Progress Notes (Signed)
Anesthesia note:  Bowel prep reminder: NA   PCP - Dr. Aviva Signs Cardiologist -none Other-   Chest x-ray - no EKG - no Stress Test - no ECHO - no Cardiac Cath - no CABG-no Pacemaker/ICD device last checked:NA  Sleep Study - no CPAP -   Pt is pre diabetic-no CBG at PAT visit- Fasting Blood Sugar at home- Checks Blood Sugar _____  Blood Thinner:no Blood Thinner Instructions: Aspirin Instructions: Last Dose:  Anesthesia review: /No    Patient denies shortness of breath, fever, cough and chest pain at PAT appointment Pt has no SOB with any activities  Patient verbalized understanding of instructions that were given to them at the PAT appointment. Patient was also instructed that they will need to review over the PAT instructions again at home before surgery.yes

## 2022-05-10 NOTE — H&P (Signed)
TOTAL KNEE ADMISSION H&P  Patient is being admitted for right total knee arthroplasty.  Subjective:  Chief Complaint: Right knee pain.  HPI: Jennifer Walker, 67 y.o. female has a history of pain and functional disability in the right knee due to arthritis and has failed non-surgical conservative treatments for greater than 12 weeks to include NSAID's and/or analgesics, corticosteriod injections, flexibility and strengthening excercises, weight reduction as appropriate, and activity modification. Onset of symptoms was gradual, starting several years ago with gradually worsening course since that time. The patient noted no past surgery on the right knee.  Patient currently rates pain in the right knee at 7 out of 10 with activity. Patient has night pain, worsening of pain with activity and weight bearing, and pain that interferes with activities of daily living. Patient has evidence of  bone-on-bone changes in the medial compartment of the right knee with near bone-on-bone arthritis in the patellofemoral compartment and large osteophyte formation  by imaging studies. There is no active infection.  Patient Active Problem List   Diagnosis Date Noted   Genetic testing 11/24/2018   Family history of cancer of mouth    Family history of prostate cancer    Family history of lung cancer    Malignant tumor of breast ([place]) 09/27/2017   Right flank pain 09/27/2017   Urinary tract infectious disease 09/27/2017   Insomnia due to stress 10/04/2013   Dyspareunia, female 04/23/2013   Pelvic floor dysfunction 04/23/2013   History of breast cancer 04/23/2013   Pulmonary nodule 04/23/2013   Primary cancer of upper outer quadrant of left female breast ([place]) 10/07/2011    Past Medical History:  Diagnosis Date   Abnormal Pap smear of cervix    yrs ago, just a repeat done   Arthritis    Breast cancer ([place])    Invasive ductal ca grade 1 er/pr positive, her2  negative   Family history of cancer  of mouth    Family history of lung cancer    Family history of prostate cancer    Fatigue    H/O colonoscopy 2003   Hot flashes    Insomnia    Personal history of radiation therapy    PONV (postoperative nausea and vomiting)    Psoriasis    Wears glasses     Past Surgical History:  Procedure Laterality Date   BREAST LUMPECTOMY Left 11/01/2011   left breast lumpectomy   BREAST SURGERY  10/1976   right breast benign tumor   [place]  2005, 2007   bilateral    LYMPH GLAND EXCISION  1978   right axillary   MANDIBLE SURGERY  1982   POLYPECTOMY      Prior to Admission medications   Medication Sig Start Date End Date Taking? Authorizing Provider  acetaminophen (TYLENOL) 500 MG tablet Take 1,000 mg by mouth every 6 (six) hours as needed (PAIN.).   Yes [provider]  calcipotriene-betamethasone (TACLONEX) ointment Apply 1 application topically daily as needed (psoriasis).    Yes [provider]  Cannabidiol POWD Apply 1 Application topically as needed (pain.). TOPICAL CBD   Yes [provider]  ibuprofen (ADVIL) 200 MG tablet Take 400 mg by mouth every 8 (eight) hours as needed (PAIN.).   Yes [provider]  Melatonin 5 MG TABS Take 5 mg by mouth at bedtime.   Yes [provider]  Multiple Vitamin (MULTIVITAMIN WITH MINERALS) TABS tablet Take 1  tablet by mouth 3 (three) times a week. Centrum Ultra Women's Multivitamin   Yes [provider]  naproxen sodium (ALEVE) 220 MG tablet Take 220 mg by mouth 2 (two) times daily as needed (pain).   Yes [provider]  Roflumilast (ZORYVE) 0.3 % CREA Apply 1 Application topically daily as needed (psoriasis).   Yes [provider]  betamethasone valerate ointment (VALISONE) 0.1 % Apply 1 Application topically 2 (two) times daily. Use for up to 2 weeks as needed. Patient not taking: Reported on 05/04/2022 02/01/22   Salvadore Dom,  MD    Allergies  Allergen Reactions   Sulfa Antibiotics Other (See Comments)    Yeast infections and ulcers   Sulfa Drugs Cross Reactors Other (See Comments)    Yeast infections and ulcers   Tape Rash   Turmeric Rash    Social History   Socioeconomic History   Marital status: Married    Spouse name: Not on file   Number of children: Not on file   Years of education: Not on file   Highest education level: Not on file  Occupational History   Not on file  Tobacco Use   Smoking status: Never   Smokeless tobacco: Never  Vaping Use   Vaping Use: Never used  Substance and Sexual Activity   Alcohol use: Yes    Alcohol/week: 5.0 standard drinks of alcohol    Types: 5 Standard drinks or equivalent per week   Drug use: No   Sexual activity: Not Currently    Partners: Male    Birth control/protection: Other-see comments    Comment: husband vasectomy  Other Topics Concern   Not on file  Social History Narrative   Not on file   Social Determinants of Health   Financial Resource Strain: Not on file  Food Insecurity: Not on file  Transportation Needs: Not on file  Physical Activity: Not on file  Stress: Not on file  Social Connections: Not on file  Intimate Partner Violence: Not on file    Tobacco Use: Low Risk  (05/05/2022)   Patient History    Smoking Tobacco Use: Never    Smokeless Tobacco Use: Never    Passive Exposure: Not on file   Social History   Substance and Sexual Activity  Alcohol Use Yes   Alcohol/week: 5.0 standard drinks of alcohol   Types: 5 Standard drinks or equivalent per week    Family History  Problem Relation Age of Onset   Breast cancer Mother 75       Inflammatory breast cancer   Heart attack Father    Tongue cancer Brother 8   Lung cancer Brother 26   Prostate cancer Brother 67       not metastatic, treated with radiation   Cancer - Other Sister 70       vulvar cancer   Cancer Maternal Uncle        unknown cancer, diagnosed late  49s    Review of Systems  Constitutional:  Negative for chills and fever.  HENT: Negative.    Eyes: Negative.   Respiratory:  Negative for cough and shortness of breath.   Cardiovascular:  Negative for chest pain and palpitations.  Gastrointestinal:  Negative for abdominal pain, constipation, diarrhea, nausea and vomiting.  Genitourinary:  Negative for dysuria, frequency and urgency.  Musculoskeletal:  Positive for joint pain.  Skin:  Negative for rash.   Objective:  Physical Exam: Well nourished and well developed.  General: Alert  and oriented x3, cooperative and pleasant, no acute distress.  Head: normocephalic, atraumatic, neck supple.  Eyes: EOMI. Abdomen: non-tender to palpation and soft, normoactive bowel sounds. Musculoskeletal: The patient has a minimally antalgic gait pattern favoring the right side.   Right Hip Exam:  The range of motion: normal without discomfort.  There is no tenderness over the greater trochanter bursa.   Right Knee Exam:  No effusion present. No swelling present.  The range of motion is: 5 to 120 degrees.  Moderate crepitus on range of motion of the knee.  Positive medial joint line tenderness.  No lateral joint line tenderness.  The knee is stable.   Left Knee Exam:  No effusion present. No swelling present.  The Range of motion is: 0 to 125 degrees.  No crepitus on range of motion of the knee.  No medial joint line tenderness.  No lateral joint line tenderness.  The knee is stable.  Calves soft and nontender. Motor function intact in LE. Strength 5/5 LE bilaterally. Neuro: Distal pulses 2+. Sensation to light touch intact in LE.  Vital signs in last 24 hours:    Imaging Review Plain radiographs demonstrate moderate degenerative joint disease of the right knee. The overall alignment is mild valgus. The bone quality appears to be adequate for age and reported activity level.  Assessment/Plan:  End stage arthritis, right knee    The patient history, physical examination, clinical judgment of the provider and imaging studies are consistent with end stage degenerative joint disease of the right knee and total knee arthroplasty is deemed medically necessary. The treatment options including medical management, injection therapy arthroscopy and arthroplasty were discussed at length. The risks and benefits of total knee arthroplasty were presented and reviewed. The risks due to aseptic loosening, infection, stiffness, patella tracking problems, thromboembolic complications and other imponderables were discussed. The patient acknowledged the explanation, agreed to proceed with the plan and consent was signed. Patient is being admitted for inpatient treatment for surgery, pain control, PT, OT, prophylactic antibiotics, VTE prophylaxis, progressive ambulation and ADLs and discharge planning. The patient is planning to be discharged  home .   Patient's anticipated LOS is less than 2 midnights, meeting these requirements: - Lives within 1 hour of care - Has a competent adult at home to recover with post-op - NO history of  - Chronic pain requiring opiods  - Diabetes  - Coronary Artery Disease  - Heart failure  - Heart attack  - Stroke  - DVT/VTE  - Cardiac arrhythmia  - Respiratory Failure/COPD  - Renal failure  - Anemia  - Advanced Liver disease  Therapy Plans: EO Disposition: Home with Husband Planned DVT Prophylaxis: Xarelto 10 mg (hx of breast cancer) DME Needed: RW PCP: Shon Baton, MD (clearance received) TXA: IV Allergies: sulfa antibiotics, tumeric, adhesive tapes (skin irritation) Anesthesia Concerns: N/V BMI: 25.8 Last HgbA1c: not diabetic  Pharmacy: [facility]  - Patient was instructed on what medications to stop prior to surgery. - Follow-up visit in 2 weeks with Dr. Wynelle Link - Begin physical therapy following surgery - Pre-operative lab work as pre-surgical testing - Prescriptions will be provided  in hospital at time of discharge  R. Jaynie Bream, PA-C Orthopedic Surgery [facility] [place]

## 2022-05-16 NOTE — Anesthesia Preprocedure Evaluation (Signed)
Anesthesia Evaluation  Patient identified by MRN, date of birth, ID band Patient awake    Reviewed: Allergy & Precautions, NPO status , Unable to perform ROS - Chart review only  History of Anesthesia Complications (+) PONV and history of anesthetic complications  Airway Mallampati: II  TM Distance: >3 FB Neck ROM: Full    Dental no notable dental hx. (+) Teeth Intact, Dental Advisory Given   Pulmonary    Pulmonary exam normal breath sounds clear to auscultation       Cardiovascular negative cardio ROS Normal cardiovascular exam Rhythm:Regular Rate:Normal     Neuro/Psych    GI/Hepatic negative GI ROS, Neg liver ROS,,,  Endo/Other  negative endocrine ROS    Renal/GU Lab Results      Component                Value               Date                      CREATININE               0.87                05/05/2022                BUN                      15                  05/05/2022                NA                       140                 05/05/2022                K                        4.3                 05/05/2022                CL                       102                 05/05/2022                CO2                      26                  05/05/2022                Musculoskeletal  (+) Arthritis ,    Abdominal   Peds  Hematology Lab Results      Component                Value               Date                      WBC  4.4                 05/05/2022                HGB                      13.3                05/05/2022                HCT                      41.1                05/05/2022                MCV                      96.5                05/05/2022                PLT                      278                 05/05/2022              Anesthesia Other Findings All: Sulfa  Hx of Breast CA  Reproductive/Obstetrics                               Anesthesia Physical Anesthesia Plan  ASA: 2  Anesthesia Plan: Spinal   Post-op Pain Management: Regional block* and Minimal or no pain anticipated   Induction:   PONV Risk Score and Plan: 4 or greater and Propofol infusion and Treatment may vary due to age or medical condition  Airway Management Planned: Natural Airway and Nasal Cannula  Additional Equipment: None  Intra-op Plan:   Post-operative Plan:   Informed Consent: I have reviewed the patients History and Physical, chart, labs and discussed the procedure including the risks, benefits and alternatives for the proposed anesthesia with the patient or authorized representative who has indicated his/her understanding and acceptance.     Dental advisory given  Plan Discussed with: CRNA, Anesthesiologist and Surgeon  Anesthesia Plan Comments: (Spinal W R adductor)        Anesthesia Quick Evaluation

## 2022-05-17 ENCOUNTER — Other Ambulatory Visit: Payer: Self-pay

## 2022-05-17 ENCOUNTER — Ambulatory Visit (HOSPITAL_COMMUNITY): Payer: No Typology Code available for payment source | Admitting: Anesthesiology

## 2022-05-17 ENCOUNTER — Encounter (HOSPITAL_COMMUNITY): Payer: Self-pay | Admitting: Orthopedic Surgery

## 2022-05-17 ENCOUNTER — Observation Stay (HOSPITAL_COMMUNITY)
Admission: RE | Admit: 2022-05-17 | Discharge: 2022-05-18 | Disposition: A | Discharge status: Home / Self Care | Payer: No Typology Code available for payment source | Source: Ambulatory Visit | Attending: Orthopedic Surgery | Admitting: Orthopedic Surgery

## 2022-05-17 ENCOUNTER — Encounter (HOSPITAL_COMMUNITY)
Admission: RE | Disposition: A | Discharge status: Home / Self Care | Payer: Self-pay | Source: Ambulatory Visit | Attending: Orthopedic Surgery

## 2022-05-17 ENCOUNTER — Ambulatory Visit (HOSPITAL_BASED_OUTPATIENT_CLINIC_OR_DEPARTMENT_OTHER): Payer: No Typology Code available for payment source | Admitting: Anesthesiology

## 2022-05-17 DIAGNOSIS — M179 Osteoarthritis of knee, unspecified: Secondary | ICD-10-CM | POA: Diagnosis present

## 2022-05-17 DIAGNOSIS — Z853 Personal history of malignant neoplasm of breast: Secondary | ICD-10-CM | POA: Insufficient documentation

## 2022-05-17 DIAGNOSIS — M1711 Unilateral primary osteoarthritis, right knee: Principal | ICD-10-CM | POA: Insufficient documentation

## 2022-05-17 DIAGNOSIS — Z01818 Encounter for other preprocedural examination: Secondary | ICD-10-CM

## 2022-05-17 HISTORY — PX: TOTAL KNEE ARTHROPLASTY: SHX125

## 2022-05-17 SURGERY — ARTHROPLASTY, KNEE, TOTAL
Anesthesia: Spinal | Site: Knee | Laterality: Right

## 2022-05-17 MED ORDER — BUPIVACAINE IN DEXTROSE 0.75-8.25 % IT SOLN
INTRATHECAL | Status: DC | PRN
  Administered 2022-05-17: 1.7 mL via INTRATHECAL

## 2022-05-17 MED ORDER — LACTATED RINGERS IV SOLN: INTRAVENOUS | Status: DC

## 2022-05-17 MED ORDER — METOCLOPRAMIDE HCL 5 MG/ML IJ SOLN: 5.0000 mg | Freq: Three times a day (TID) | INTRAMUSCULAR | Status: DC | PRN

## 2022-05-17 MED ORDER — POLYETHYLENE GLYCOL 3350 17 G PO PACK: 17.0000 g | PACK | Freq: Every day | ORAL | Status: DC | PRN

## 2022-05-17 MED ORDER — DEXAMETHASONE SODIUM PHOSPHATE 10 MG/ML IJ SOLN
10.0000 mg | Freq: Once | INTRAMUSCULAR | Status: AC
  Administered 2022-05-18: 10 mg via INTRAVENOUS
  Filled 2022-05-17: qty 1

## 2022-05-17 MED ORDER — ONDANSETRON HCL 4 MG PO TABS: 4.0000 mg | ORAL_TABLET | Freq: Four times a day (QID) | ORAL | Status: DC | PRN

## 2022-05-17 MED ORDER — POVIDONE-IODINE 10 % EX SWAB
2.0000 | Freq: Once | CUTANEOUS | Status: AC
  Administered 2022-05-17: 2 via TOPICAL

## 2022-05-17 MED ORDER — ARTIFICIAL TEARS OPHTHALMIC OINT
TOPICAL_OINTMENT | OPHTHALMIC | Status: AC
  Filled 2022-05-17: qty 3.5

## 2022-05-17 MED ORDER — OXYCODONE HCL 5 MG/5ML PO SOLN: 5.0000 mg | Freq: Once | ORAL | Status: DC | PRN

## 2022-05-17 MED ORDER — ACETAMINOPHEN 10 MG/ML IV SOLN
1000.0000 mg | Freq: Four times a day (QID) | INTRAVENOUS | Status: DC
  Administered 2022-05-17: 1000 mg via INTRAVENOUS
  Filled 2022-05-17: qty 100

## 2022-05-17 MED ORDER — FENTANYL CITRATE (PF) 100 MCG/2ML IJ SOLN: INTRAMUSCULAR | Status: DC | PRN

## 2022-05-17 MED ORDER — ONDANSETRON HCL 4 MG/2ML IJ SOLN: 4.0000 mg | Freq: Four times a day (QID) | INTRAMUSCULAR | Status: DC | PRN

## 2022-05-17 MED ORDER — PHENYLEPHRINE HCL (PRESSORS) 10 MG/ML IV SOLN
INTRAVENOUS | Status: AC
  Filled 2022-05-17: qty 1

## 2022-05-17 MED ORDER — TRAMADOL HCL 50 MG PO TABS
50.0000 mg | ORAL_TABLET | Freq: Four times a day (QID) | ORAL | Status: DC | PRN
  Administered 2022-05-17: 100 mg via ORAL
  Filled 2022-05-17: qty 2

## 2022-05-17 MED ORDER — FENTANYL CITRATE PF 50 MCG/ML IJ SOSY
50.0000 ug | PREFILLED_SYRINGE | Freq: Once | INTRAMUSCULAR | Status: AC
  Administered 2022-05-17: 50 ug via INTRAVENOUS
  Filled 2022-05-17: qty 2

## 2022-05-17 MED ORDER — ROPIVACAINE HCL 5 MG/ML IJ SOLN
INTRAMUSCULAR | Status: DC | PRN
  Administered 2022-05-17: 30 mL via PERINEURAL

## 2022-05-17 MED ORDER — OXYCODONE HCL 5 MG PO TABS: 5.0000 mg | ORAL_TABLET | Freq: Once | ORAL | Status: DC | PRN

## 2022-05-17 MED ORDER — SODIUM CHLORIDE 0.9 % IR SOLN
Status: DC | PRN
  Administered 2022-05-17: 3000 mL

## 2022-05-17 MED ORDER — ACETAMINOPHEN 500 MG PO TABS
1000.0000 mg | ORAL_TABLET | Freq: Four times a day (QID) | ORAL | Status: DC
  Administered 2022-05-17 – 2022-05-18 (×3): 1000 mg via ORAL
  Filled 2022-05-17 (×3): qty 2

## 2022-05-17 MED ORDER — MENTHOL 3 MG MT LOZG: 1.0000 | LOZENGE | OROMUCOSAL | Status: DC | PRN

## 2022-05-17 MED ORDER — STERILE WATER FOR IRRIGATION IR SOLN
Status: DC | PRN
  Administered 2022-05-17: 2000 mL

## 2022-05-17 MED ORDER — METOCLOPRAMIDE HCL 5 MG PO TABS: 5.0000 mg | ORAL_TABLET | Freq: Three times a day (TID) | ORAL | Status: DC | PRN

## 2022-05-17 MED ORDER — CEFAZOLIN SODIUM-DEXTROSE 2-4 GM/100ML-% IV SOLN
2.0000 g | Freq: Four times a day (QID) | INTRAVENOUS | Status: AC
  Administered 2022-05-17 – 2022-05-18 (×2): 2 g via INTRAVENOUS
  Filled 2022-05-17 (×2): qty 100

## 2022-05-17 MED ORDER — SODIUM CHLORIDE (PF) 0.9 % IJ SOLN
INTRAMUSCULAR | Status: DC | PRN
  Administered 2022-05-17: 60 mL

## 2022-05-17 MED ORDER — AMISULPRIDE (ANTIEMETIC) 5 MG/2ML IV SOLN: 10.0000 mg | Freq: Once | INTRAVENOUS | Status: DC | PRN

## 2022-05-17 MED ORDER — METHOCARBAMOL 500 MG IVPB - SIMPLE MED: 500.0000 mg | Freq: Four times a day (QID) | INTRAVENOUS | Status: DC | PRN

## 2022-05-17 MED ORDER — GABAPENTIN 300 MG PO CAPS
300.0000 mg | ORAL_CAPSULE | Freq: Three times a day (TID) | ORAL | Status: DC
  Administered 2022-05-17 – 2022-05-18 (×3): 300 mg via ORAL
  Filled 2022-05-17 (×3): qty 1

## 2022-05-17 MED ORDER — MIDAZOLAM HCL 2 MG/2ML IJ SOLN
1.0000 mg | Freq: Once | INTRAMUSCULAR | Status: AC
  Administered 2022-05-17: 2 mg via INTRAVENOUS
  Filled 2022-05-17: qty 2

## 2022-05-17 MED ORDER — PROPOFOL 500 MG/50ML IV EMUL
INTRAVENOUS | Status: DC | PRN
  Administered 2022-05-17: 100 ug/kg/min via INTRAVENOUS

## 2022-05-17 MED ORDER — BUPIVACAINE LIPOSOME 1.3 % IJ SUSP: 20.0000 mL | Freq: Once | INTRAMUSCULAR | Status: DC

## 2022-05-17 MED ORDER — SODIUM CHLORIDE (PF) 0.9 % IJ SOLN
INTRAMUSCULAR | Status: AC
  Filled 2022-05-17: qty 10

## 2022-05-17 MED ORDER — DIPHENHYDRAMINE HCL 12.5 MG/5ML PO ELIX: 12.5000 mg | ORAL_SOLUTION | ORAL | Status: DC | PRN

## 2022-05-17 MED ORDER — OXYCODONE HCL 5 MG PO TABS
5.0000 mg | ORAL_TABLET | ORAL | Status: DC | PRN
  Administered 2022-05-18: 5 mg via ORAL
  Filled 2022-05-17: qty 1

## 2022-05-17 MED ORDER — BUPIVACAINE LIPOSOME 1.3 % IJ SUSP
INTRAMUSCULAR | Status: AC
  Filled 2022-05-17: qty 20

## 2022-05-17 MED ORDER — ORAL CARE MOUTH RINSE: 15.0000 mL | Freq: Once | OROMUCOSAL | Status: AC

## 2022-05-17 MED ORDER — HYDROMORPHONE HCL 1 MG/ML IJ SOLN: 0.2500 mg | INTRAMUSCULAR | Status: DC | PRN

## 2022-05-17 MED ORDER — MORPHINE SULFATE (PF) 2 MG/ML IV SOLN: 1.0000 mg | INTRAVENOUS | Status: DC | PRN

## 2022-05-17 MED ORDER — ONDANSETRON HCL 4 MG/2ML IJ SOLN
INTRAMUSCULAR | Status: AC
  Filled 2022-05-17: qty 2

## 2022-05-17 MED ORDER — METHOCARBAMOL 500 MG PO TABS: 500.0000 mg | ORAL_TABLET | Freq: Four times a day (QID) | ORAL | Status: DC | PRN

## 2022-05-17 MED ORDER — CHLORHEXIDINE GLUCONATE 0.12 % MT SOLN
15.0000 mL | Freq: Once | OROMUCOSAL | Status: AC
  Administered 2022-05-17: 15 mL via OROMUCOSAL

## 2022-05-17 MED ORDER — SODIUM CHLORIDE 0.9 % IV SOLN: INTRAVENOUS | Status: DC

## 2022-05-17 MED ORDER — CEFAZOLIN SODIUM-DEXTROSE 2-4 GM/100ML-% IV SOLN
2.0000 g | INTRAVENOUS | Status: AC
  Administered 2022-05-17: 2 g via INTRAVENOUS
  Filled 2022-05-17: qty 100

## 2022-05-17 MED ORDER — BUPIVACAINE LIPOSOME 1.3 % IJ SUSP
INTRAMUSCULAR | Status: DC | PRN
  Administered 2022-05-17: 20 mL

## 2022-05-17 MED ORDER — CLONIDINE HCL (ANALGESIA) 100 MCG/ML EP SOLN
EPIDURAL | Status: DC | PRN
  Administered 2022-05-17: 100 ug

## 2022-05-17 MED ORDER — ONDANSETRON HCL 4 MG/2ML IJ SOLN: 4.0000 mg | Freq: Once | INTRAMUSCULAR | Status: DC | PRN

## 2022-05-17 MED ORDER — ACETAMINOPHEN 10 MG/ML IV SOLN: 1000.0000 mg | Freq: Once | INTRAVENOUS | Status: DC | PRN

## 2022-05-17 MED ORDER — EPHEDRINE SULFATE-NACL 50-0.9 MG/10ML-% IV SOSY
PREFILLED_SYRINGE | INTRAVENOUS | Status: DC | PRN
  Administered 2022-05-17 (×5): 5 mg via INTRAVENOUS

## 2022-05-17 MED ORDER — ROFLUMILAST 0.3 % EX CREA: 1.0000 | TOPICAL_CREAM | Freq: Every day | CUTANEOUS | Status: DC | PRN

## 2022-05-17 MED ORDER — DOCUSATE SODIUM 100 MG PO CAPS
100.0000 mg | ORAL_CAPSULE | Freq: Two times a day (BID) | ORAL | Status: DC
  Administered 2022-05-17 – 2022-05-18 (×2): 100 mg via ORAL
  Filled 2022-05-17 (×2): qty 1

## 2022-05-17 MED ORDER — DEXAMETHASONE SODIUM PHOSPHATE 10 MG/ML IJ SOLN
8.0000 mg | Freq: Once | INTRAMUSCULAR | Status: AC
  Administered 2022-05-17: 8 mg via INTRAVENOUS

## 2022-05-17 MED ORDER — SODIUM CHLORIDE (PF) 0.9 % IJ SOLN
INTRAMUSCULAR | Status: AC
  Filled 2022-05-17: qty 50

## 2022-05-17 MED ORDER — DEXAMETHASONE SODIUM PHOSPHATE 10 MG/ML IJ SOLN
INTRAMUSCULAR | Status: AC
  Filled 2022-05-17: qty 1

## 2022-05-17 MED ORDER — ONDANSETRON HCL 4 MG/2ML IJ SOLN
INTRAMUSCULAR | Status: DC | PRN
  Administered 2022-05-17: 4 mg via INTRAVENOUS

## 2022-05-17 MED ORDER — RIVAROXABAN 10 MG PO TABS
10.0000 mg | ORAL_TABLET | Freq: Every day | ORAL | Status: DC
  Administered 2022-05-18: 10 mg via ORAL
  Filled 2022-05-17: qty 1

## 2022-05-17 MED ORDER — PROPOFOL 10 MG/ML IV BOLUS
INTRAVENOUS | Status: DC | PRN
  Administered 2022-05-17: 20 mg via INTRAVENOUS
  Administered 2022-05-17: 30 mg via INTRAVENOUS

## 2022-05-17 MED ORDER — BISACODYL 10 MG RE SUPP: 10.0000 mg | Freq: Every day | RECTAL | Status: DC | PRN

## 2022-05-17 MED ORDER — PHENOL 1.4 % MT LIQD: 1.0000 | OROMUCOSAL | Status: DC | PRN

## 2022-05-17 MED ORDER — EPHEDRINE 5 MG/ML INJ
INTRAVENOUS | Status: AC
  Filled 2022-05-17: qty 5

## 2022-05-17 MED ORDER — TRANEXAMIC ACID-NACL 1000-0.7 MG/100ML-% IV SOLN
1000.0000 mg | INTRAVENOUS | Status: AC
  Administered 2022-05-17: 1000 mg via INTRAVENOUS
  Filled 2022-05-17: qty 100

## 2022-05-17 MED ORDER — FLEET ENEMA 7-19 GM/118ML RE ENEM: 1.0000 | ENEMA | Freq: Once | RECTAL | Status: DC | PRN

## 2022-05-17 SURGICAL SUPPLY — 58 items
ATTUNE MED DOME PAT 38 KNEE (Knees) IMPLANT
ATTUNE PS FEM RT SZ 7 CEM KNEE (Femur) IMPLANT
ATTUNE PSRP INSR SZ7 8 KNEE (Insert) IMPLANT
BAG COUNTER SPONGE SURGICOUNT (BAG) IMPLANT
BAG SPEC THK2 15X12 ZIP CLS (MISCELLANEOUS)
BAG ZIPLOCK 12X15 (MISCELLANEOUS) ×1 IMPLANT
BASE TIBIAL ROT PLAT SZ 7 KNEE (Knees) IMPLANT
BLADE SAG 18X100X1.27 (BLADE) ×1 IMPLANT
BLADE SAW SGTL 11.0X1.19X90.0M (BLADE) ×1 IMPLANT
BNDG ELASTIC 6X5.8 VLCR STR LF (GAUZE/BANDAGES/DRESSINGS) ×1 IMPLANT
BOWL SMART MIX CTS (DISPOSABLE) ×1 IMPLANT
BSPLAT TIB 7 CMNT ROT PLAT STR (Knees) ×1 IMPLANT
CEMENT HV SMART SET (Cement) ×2 IMPLANT
CLSR STERI-STRIP ANTIMIC 1/2X4 (GAUZE/BANDAGES/DRESSINGS) IMPLANT
COVER SURGICAL LIGHT HANDLE (MISCELLANEOUS) ×1 IMPLANT
CUFF TOURN SGL QUICK 34 (TOURNIQUET CUFF) ×1
CUFF TRNQT CYL 34X4.125X (TOURNIQUET CUFF) ×1 IMPLANT
DRAPE INCISE IOBAN 66X45 STRL (DRAPES) ×1 IMPLANT
DRAPE U-SHAPE 47X51 STRL (DRAPES) ×1 IMPLANT
DRESSING AQUACEL AG SP 3.5X10 (GAUZE/BANDAGES/DRESSINGS) IMPLANT
DRSG AQUACEL AG ADV 3.5X10 (GAUZE/BANDAGES/DRESSINGS) ×1 IMPLANT
DRSG AQUACEL AG SP 3.5X10 (GAUZE/BANDAGES/DRESSINGS) ×1
DURAPREP 26ML APPLICATOR (WOUND CARE) ×1 IMPLANT
ELECT REM PT RETURN 15FT ADLT (MISCELLANEOUS) ×1 IMPLANT
GLOVE BIO SURGEON STRL SZ 6.5 (GLOVE) IMPLANT
GLOVE BIO SURGEON STRL SZ7.5 (GLOVE) IMPLANT
GLOVE BIO SURGEON STRL SZ8 (GLOVE) ×1 IMPLANT
GLOVE BIOGEL PI IND STRL 6.5 (GLOVE) IMPLANT
GLOVE BIOGEL PI IND STRL 7.0 (GLOVE) IMPLANT
GLOVE BIOGEL PI IND STRL 8 (GLOVE) ×1 IMPLANT
GOWN STRL REUS W/ TWL LRG LVL3 (GOWN DISPOSABLE) ×1 IMPLANT
GOWN STRL REUS W/ TWL XL LVL3 (GOWN DISPOSABLE) IMPLANT
GOWN STRL REUS W/TWL LRG LVL3 (GOWN DISPOSABLE) ×1
GOWN STRL REUS W/TWL XL LVL3 (GOWN DISPOSABLE) ×1
HANDPIECE INTERPULSE COAX TIP (DISPOSABLE) ×1
HOLDER FOLEY CATH W/STRAP (MISCELLANEOUS) IMPLANT
IMMOBILIZER KNEE 20 (SOFTGOODS) ×1
IMMOBILIZER KNEE 20 THIGH 36 (SOFTGOODS) ×1 IMPLANT
KIT TURNOVER KIT A (KITS) IMPLANT
MANIFOLD NEPTUNE II (INSTRUMENTS) ×1 IMPLANT
NS IRRIG 1000ML POUR BTL (IV SOLUTION) ×1 IMPLANT
PACK TOTAL KNEE CUSTOM (KITS) ×1 IMPLANT
PADDING CAST COTTON 6X4 STRL (CAST SUPPLIES) ×2 IMPLANT
PIN STEINMAN FIXATION KNEE (PIN) IMPLANT
PROTECTOR NERVE ULNAR (MISCELLANEOUS) ×1 IMPLANT
SET HNDPC FAN SPRY TIP SCT (DISPOSABLE) ×1 IMPLANT
SPIKE FLUID TRANSFER (MISCELLANEOUS) ×1 IMPLANT
STRIP CLOSURE SKIN 1/2X4 (GAUZE/BANDAGES/DRESSINGS) ×2 IMPLANT
SUT MNCRL AB 4-0 PS2 18 (SUTURE) ×1 IMPLANT
SUT STRATAFIX 0 PDS 27 VIOLET (SUTURE) ×1
SUT VIC AB 2-0 CT1 27 (SUTURE) ×3
SUT VIC AB 2-0 CT1 TAPERPNT 27 (SUTURE) ×3 IMPLANT
SUTURE STRATFX 0 PDS 27 VIOLET (SUTURE) ×1 IMPLANT
TIBIAL BASE ROT PLAT SZ 7 KNEE (Knees) ×1 IMPLANT
TRAY FOLEY MTR SLVR 16FR STAT (SET/KITS/TRAYS/PACK) ×1 IMPLANT
TUBE SUCTION HIGH CAP CLEAR NV (SUCTIONS) ×1 IMPLANT
WATER STERILE IRR 1000ML POUR (IV SOLUTION) ×2 IMPLANT
WRAP KNEE MAXI GEL POST OP (GAUZE/BANDAGES/DRESSINGS) ×1 IMPLANT

## 2022-05-17 NOTE — Anesthesia Procedure Notes (Signed)
Anesthesia Regional Block: Adductor canal block   Pre-Anesthetic Checklist: , timeout performed,  Correct Patient, Correct Site, Correct Laterality,  Correct Procedure, Correct Position, site marked,  Risks and benefits discussed,  Surgical consent,  Pre-op evaluation,  At surgeon's request and post-op pain management  Laterality: Lower and Right  Prep: chloraprep       Needles:  Injection technique: Single-shot  Needle Type: Echogenic Needle     Needle Length: 9cm  Needle Gauge: 22     Additional Needles:   Procedures:,,,, ultrasound used (permanent image in chart),,    Narrative:  Start time: 05/17/2022 10:55 AM End time: 05/17/2022 11:00 AM Injection made incrementally with aspirations every 5 mL.  Performed by: Personally  Anesthesiologist: Barnet Glasgow, MD  Additional Notes: Block assessed prior to surgery. Pt tolerated procedure well.

## 2022-05-17 NOTE — Discharge Instructions (Addendum)
Gaynelle Arabian, MD Total Joint Specialist [facility] [address], [address] [place], [place] [address] [phone]  TOTAL KNEE REPLACEMENT POSTOPERATIVE DIRECTIONS    Knee Rehabilitation, Guidelines Following Surgery  Results after knee surgery are often greatly improved when you follow the exercise, range of motion and muscle strengthening exercises prescribed by your doctor. Safety measures are also important to protect the knee from further injury. If any of these exercises cause you to have increased pain or swelling in your knee joint, decrease the amount until you are comfortable again and slowly increase them. If you have problems or questions, call your caregiver or physical therapist for advice.   BLOOD CLOT PREVENTION Take a 10 mg Xarelto once a day for three weeks following surgery. Then take an 81 mg Aspirin once a day for three weeks. Then discontinue Aspirin. You may resume your vitamins/supplements once you have discontinued the Xarelto. Do not take any NSAIDs (Advil, Aleve, Ibuprofen, Meloxicam, etc.) until you have discontinued the Xarelto.   GABAPENTIN INSTRUCTIONS Take a 300 mg capsule three times a day for two weeks following surgery.Then take a 300 mg capsule two times a day for two weeks. Then take a 300 mg capsule once a day for two weeks. Then discontinue.  HOME CARE INSTRUCTIONS  Remove items at home which could result in a fall. This includes throw rugs or furniture in walking pathways.  ICE to the affected knee as much as tolerated. Icing helps control swelling. If the swelling is well controlled you will be more comfortable and rehab easier. Continue to use ice on the knee for pain and swelling from surgery. You may notice swelling that will progress down to the foot and ankle. This is normal after surgery. Elevate the leg when you are not up walking on it.    Continue to use the breathing machine which will help keep your temperature down.  It is common for your temperature to cycle up and down following surgery, especially at night when you are not up moving around and exerting yourself. The breathing machine keeps your lungs expanded and your temperature down. Do not place pillow under the operative knee, focus on keeping the knee straight while resting  DIET You may resume your previous home diet once you are discharged from the hospital.  DRESSING / WOUND CARE / SHOWERING Keep your bulky bandage on for 2 days. On the third post-operative day you may remove the Ace bandage and gauze. There is a waterproof adhesive bandage on your skin which will stay in place until your first follow-up appointment. Once you remove this you will not need to place another bandage You may begin showering 3 days following surgery, but do not submerge the incision under water.  ACTIVITY For the first 5 days, the key is rest and control of pain and swelling Do your home exercises twice a day starting on post-operative day 3. On the days you go to physical therapy, just do the home exercises once that day. You should rest, ice and elevate the leg for 50 minutes out of every hour. Get up and walk/stretch for 10 minutes per hour. After 5 days you can increase your activity slowly as tolerated. Walk with your walker as instructed. Use the walker until you are comfortable transitioning to a cane. Walk with the cane in the opposite hand of the operative leg. You may discontinue the cane once you are comfortable and walking steadily. Avoid periods of inactivity such as  sitting longer than an hour when not asleep. This helps prevent blood clots.  You may discontinue the knee immobilizer once you are able to perform a straight leg raise while lying down. You may resume a sexual relationship in one month or when given the OK by your doctor.  You may return to work once you are cleared by your doctor.  Do not drive a car for 6 weeks or until released by your  surgeon.  Do not drive while taking narcotics.  TED HOSE STOCKINGS Wear the elastic stockings on both legs for three weeks following surgery during the day. You may remove them at night for sleeping.  WEIGHT BEARING Weight bearing as tolerated with assist device (walker, cane, etc) as directed, use it as long as suggested by your surgeon or therapist, typically at least 4-6 weeks.  POSTOPERATIVE CONSTIPATION PROTOCOL Constipation - defined medically as fewer than three stools per week and severe constipation as less than one stool per week.  One of the most common issues patients have following surgery is constipation.  Even if you have a regular bowel pattern at home, your normal regimen is likely to be disrupted due to multiple reasons following surgery.  Combination of anesthesia, postoperative narcotics, change in appetite and fluid intake all can affect your bowels.  In order to avoid complications following surgery, here are some recommendations in order to help you during your recovery period.  Colace (docusate) - Pick up an over-the-counter form of Colace or another stool softener and take twice a day as long as you are requiring postoperative pain medications.  Take with a full glass of water daily.  If you experience loose stools or diarrhea, hold the colace until you stool forms back up. If your symptoms do not get better within 1 week or if they get worse, check with your doctor. Dulcolax (bisacodyl) - Pick up over-the-counter and take as directed by the product packaging as needed to assist with the movement of your bowels.  Take with a full glass of water.  Use this product as needed if not relieved by Colace only.  MiraLax (polyethylene glycol) - Pick up over-the-counter to have on hand. MiraLax is a solution that will increase the amount of water in your bowels to assist with bowel movements.  Take as directed and can mix with a glass of water, juice, soda, coffee, or tea. Take if you  go more than two days without a movement. Do not use MiraLax more than once per day. Call your doctor if you are still constipated or irregular after using this medication for 7 days in a row.  If you continue to have problems with postoperative constipation, please contact the office for further assistance and recommendations.  If you experience "the worst abdominal pain ever" or develop nausea or vomiting, please contact the office immediatly for further recommendations for treatment.  ITCHING If you experience itching with your medications, try taking only a single pain pill, or even half a pain pill at a time.  You can also use Benadryl over the counter for itching or also to help with sleep.   MEDICATIONS See your medication summary on the "After Visit Summary" that the nursing staff will review with you prior to discharge.  You may have some home medications which will be placed on hold until you complete the course of blood thinner medication.  It is important for you to complete the blood thinner medication as prescribed by your surgeon.  Continue your approved medications as instructed at time of discharge.  PRECAUTIONS If you experience chest pain or shortness of breath - call 911 immediately for transfer to the hospital emergency department.  If you develop a fever greater that 101 F, purulent drainage from wound, increased redness or drainage from wound, foul odor from the wound/dressing, or calf pain - CONTACT YOUR SURGEON.                                                   FOLLOW-UP APPOINTMENTS Make sure you keep all of your appointments after your operation with your surgeon and caregivers. You should call the office at the above phone number and make an appointment for approximately two weeks after the date of your surgery or on the date instructed by your surgeon outlined in the "After Visit Summary".  RANGE OF MOTION AND STRENGTHENING EXERCISES  Rehabilitation of the knee is  important following a knee injury or an operation. After just a few days of immobilization, the muscles of the thigh which control the knee become weakened and shrink (atrophy). Knee exercises are designed to build up the tone and strength of the thigh muscles and to improve knee motion. Often times heat used for twenty to thirty minutes before working out will loosen up your tissues and help with improving the range of motion but do not use heat for the first two weeks following surgery. These exercises can be done on a training (exercise) mat, on the floor, on a table or on a bed. Use what ever works the best and is most comfortable for you Knee exercises include:  Leg Lifts - While your knee is still immobilized in a splint or cast, you can do straight leg raises. Lift the leg to 60 degrees, hold for 3 sec, and slowly lower the leg. Repeat 10-20 times 2-3 times daily. Perform this exercise against resistance later as your knee gets better.  Quad and Hamstring Sets - Tighten up the muscle on the front of the thigh (Quad) and hold for 5-10 sec. Repeat this 10-20 times hourly. Hamstring sets are done by pushing the foot backward against an object and holding for 5-10 sec. Repeat as with quad sets.  Leg Slides: Lying on your back, slowly slide your foot toward your buttocks, bending your knee up off the floor (only go as far as is comfortable). Then slowly slide your foot back down until your leg is flat on the floor again. Angel Wings: Lying on your back spread your legs to the side as far apart as you can without causing discomfort.  A rehabilitation program following serious knee injuries can speed recovery and prevent re-injury in the future due to weakened muscles. Contact your doctor or a physical therapist for more information on knee rehabilitation.   POST-OPERATIVE OPIOID TAPER INSTRUCTIONS: It is important to wean off of your opioid medication as soon as possible. If you do not need pain medication  after your surgery it is ok to stop day one. Opioids include: Codeine, Hydrocodone(Norco, Vicodin), Oxycodone(Percocet, oxycontin) and hydromorphone amongst others.  Long term and even short term use of opiods can cause: Increased pain response Dependence Constipation Depression Respiratory depression And more.  Withdrawal symptoms can include Flu like symptoms Nausea, vomiting And more Techniques to manage these symptoms Hydrate well Eat regular healthy meals  Stay active Use relaxation techniques(deep breathing, meditating, yoga) Do Not substitute Alcohol to help with tapering If you have been on opioids for less than two weeks and do not have pain than it is ok to stop all together.  Plan to wean off of opioids This plan should start within one week post op of your joint replacement. Maintain the same interval or time between taking each dose and first decrease the dose.  Cut the total daily intake of opioids by one tablet each day Next start to increase the time between doses. The last dose that should be eliminated is the evening dose.   IF YOU ARE TRANSFERRED TO A SKILLED REHAB FACILITY If the patient is transferred to a skilled rehab facility following release from the hospital, a list of the current medications will be sent to the facility for the patient to continue.  When discharged from the skilled rehab facility, please have the facility set up the patient's [place] prior to being released. Also, the skilled facility will be responsible for providing the patient with their medications at time of release from the facility to include their pain medication, the muscle relaxants, and their blood thinner medication. If the patient is still at the rehab facility at time of the two week follow up appointment, the skilled rehab facility will also need to assist the patient in arranging follow up appointment in our office and any transportation needs.  MAKE SURE  YOU:  Understand these instructions.  Get help right away if you are not doing well or get worse.   DENTAL ANTIBIOTICS:  In most cases prophylactic antibiotics for Dental procdeures after total joint surgery are not necessary.  Exceptions are as follows:  1. History of prior total joint infection  2. Severely immunocompromised (Organ Transplant, cancer chemotherapy, Rheumatoid biologic medications such as [place])  3. Poorly controlled diabetes (A1C &gt; 8.0, blood glucose over 200)  If you have one of these conditions, contact your surgeon for an antibiotic prescription, prior to your dental procedure.    Pick up stool softner and laxative for home use following surgery while on pain medications. Do not submerge incision under water. Please use good hand washing techniques while changing dressing each day. May shower starting three days after surgery. Please use a clean towel to pat the incision dry following showers. Continue to use ice for pain and swelling after surgery. Do not use any lotions or creams on the incision until instructed by your surgeon.  Information on my medicine - XARELTO (Rivaroxaban)  Why was Xarelto prescribed for you? Xarelto was prescribed for you to reduce the risk of blood clots forming after orthopedic surgery. The medical term for these abnormal blood clots is venous thromboembolism (VTE).  What do you need to know about xarelto ? Take your Xarelto ONCE DAILY at the same time every day. You may take it either with or without food.  If you have difficulty swallowing the tablet whole, you may crush it and mix in applesauce just prior to taking your dose.  Take Xarelto exactly as prescribed by your doctor and DO NOT stop taking Xarelto without talking to the doctor who prescribed the medication.  Stopping without other VTE prevention medication to take the place of Xarelto may increase your risk of developing a clot.  After discharge, you  should have regular check-up appointments with your healthcare provider that is prescribing your Xarelto.    What do you do if you miss  a dose? If you miss a dose, take it as soon as you remember on the same day then continue your regularly scheduled once daily regimen the next day. Do not take two doses of Xarelto on the same day.   Important Safety Information A possible side effect of Xarelto is bleeding. You should call your healthcare provider right away if you experience any of the following: Bleeding from an injury or your nose that does not stop. Unusual colored urine (red or dark brown) or unusual colored stools (red or black). Unusual bruising for unknown reasons. A serious fall or if you hit your head (even if there is no bleeding).  Some medicines may interact with Xarelto and might increase your risk of bleeding while on Xarelto. To help avoid this, consult your healthcare provider or pharmacist prior to using any new prescription or non-prescription medications, including herbals, vitamins, non-steroidal anti-inflammatory drugs (NSAIDs) and supplements.  This website has more information on Xarelto: [email or web address]

## 2022-05-17 NOTE — Anesthesia Postprocedure Evaluation (Signed)
Anesthesia Post Note  Patient: Jennifer Walker  Procedure(s) Performed: TOTAL KNEE ARTHROPLASTY (Right: Knee)     Patient location during evaluation: Nursing Unit Anesthesia Type: Spinal Level of consciousness: oriented and awake and alert Pain management: pain level controlled Vital Signs Assessment: post-procedure vital signs reviewed and stable Respiratory status: spontaneous breathing and respiratory function stable Cardiovascular status: blood pressure returned to baseline and stable Postop Assessment: no headache, no backache, no apparent nausea or vomiting and patient able to bend at knees Anesthetic complications: no  No notable events documented.  Last Vitals:  Vitals:   05/17/22 1345 05/17/22 1414  BP: 113/65 112/72  Pulse: (!) 56 (!) 53  Resp: 18 18  Temp:    SpO2: 100% 100%    Last Pain:  Vitals:   05/17/22 1339  TempSrc:   PainSc: 0-No pain                 Barnet Glasgow

## 2022-05-17 NOTE — Interval H&P Note (Signed)
History and Physical Interval Note:  05/17/2022 9:26 AM  Jennifer Walker  has presented today for surgery, with the diagnosis of right knee osteoarthritis.  The various methods of treatment have been discussed with the patient and family. After consideration of risks, benefits and other options for treatment, the patient has consented to  Procedure(s): TOTAL KNEE ARTHROPLASTY (Right) as a surgical intervention.  The patient's history has been reviewed, patient examined, no change in status, stable for surgery.  I have reviewed the patient's chart and labs.  Questions were answered to the patient's satisfaction.     Pilar Plate Sakshi Sermons

## 2022-05-17 NOTE — Evaluation (Signed)
Physical Therapy Evaluation Patient Details Name: Jennifer Walker MRN: [id number] DOB: 29-Dec-1955 Today's Date: 05/17/2022  History of Present Illness  Pt is 67 yo female s/p R TKA on 05/17/22.  Pt with hx including arthritis , prior knee surgery, breast lumpectomy  Clinical Impression  Pt is s/p TKA resulting in the deficits listed below (see PT Problem List). At baseline pt is active and independent.  She has home support and has home set up so that she does not have to go up/down her stairs.  Pt motivated and expected to progress well.  She had good quad activation (no ext lag), ROM, and pain control.  Ambulated 78' with RW and min guard.   Pt will benefit from skilled PT to increase their independence and safety with mobility to allow discharge to the venue listed below.         Recommendations for follow up therapy are one component of a multi-disciplinary discharge planning process, led by the attending physician.  Recommendations may be updated based on patient status, additional functional criteria and insurance authorization.  Follow Up Recommendations Follow physician's recommendations for discharge plan and follow up therapies      Assistance Recommended at Discharge Intermittent Supervision/Assistance  Patient can return home with the following  A little help with walking and/or transfers;A little help with bathing/dressing/bathroom;Assistance with cooking/housework;Help with stairs or ramp for entrance    Equipment Recommendations Rolling walker (2 wheels)  Recommendations for Other Services       Functional Status Assessment Patient has had a recent decline in their functional status and demonstrates the ability to make significant improvements in function in a reasonable and predictable amount of time.     Precautions / Restrictions Precautions Precautions: Fall;Knee Required Braces or Orthoses: Knee Immobilizer - Right Knee Immobilizer - Right: Discontinue  once straight leg raise with < 10 degree lag Restrictions Weight Bearing Restrictions: Yes RLE Weight Bearing: Weight bearing as tolerated      Mobility  Bed Mobility Overal bed mobility: Needs Assistance Bed Mobility: Supine to Sit     Supine to sit: Supervision          Transfers Overall transfer level: Needs assistance Equipment used: Rolling walker (2 wheels) Transfers: Sit to/from Stand Sit to Stand: Min guard           General transfer comment: cues for hand placement and R LE managment    Ambulation/Gait Ambulation/Gait assistance: Min guard Gait Distance (Feet): 75 Feet Assistive device: Rolling walker (2 wheels) Gait Pattern/deviations: Step-to pattern, Decreased stride length, Decreased weight shift to right Gait velocity: decreased     General Gait Details: cues for sequencing and RW proximity  Stairs            Wheelchair Mobility    Modified Rankin (Stroke Patients Only)       Balance Overall balance assessment: Needs assistance Sitting-balance support: No upper extremity supported Sitting balance-Leahy Scale: Good     Standing balance support: Bilateral upper extremity supported, No upper extremity supported Standing balance-Leahy Scale: Fair Standing balance comment: RW to ambulate but could static stand without support                             Pertinent Vitals/Pain Pain Assessment Pain Assessment: 0-10 Pain Score: 1  Pain Location: R knee Pain Descriptors / Indicators: Discomfort Pain Intervention(s): Limited activity within patient's tolerance, Monitored during session, Premedicated before session, Ice applied  Home Living Family/patient expects to be discharged to:: Private residence Living Arrangements: Spouse/significant other Available Help at Discharge: Family;Available 24 hours/day Type of Home: House Home Access: Stairs to enter   [employer] of Steps: Pt reports can drive around to  basement if needed.  If goes in main level 2 steps to enter with rails on L but then would have to go down to basement flight but has no rails (only banister half way) Alternate Level Stairs-Number of Steps: 3 level home plans to stay in basement (has family room, bathroom, bedroom, and pt has set up a mini-kitchen for recovery). Home Layout: Multi-level Home Equipment: Shower seat;Crutches      Prior Function Prior Level of Function : Independent/Modified Independent;Driving             Mobility Comments: likes to play raquet ball and tennis       Hand Dominance        Extremity/Trunk Assessment   Upper Extremity Assessment Upper Extremity Assessment: Overall WFL for tasks assessed    Lower Extremity Assessment Lower Extremity Assessment: RLE deficits/detail RLE Deficits / Details: Expected post op changes; ROM knee 5 to 90 degrees; MMT 3/5 hip and knee; 5/5 ankle    Cervical / Trunk Assessment Cervical / Trunk Assessment: Normal  Communication   Communication: No difficulties  Cognition Arousal/Alertness: Awake/alert Behavior During Therapy: WFL for tasks assessed/performed Overall Cognitive Status: Within Functional Limits for tasks assessed                                          General Comments General comments (skin integrity, edema, etc.): VSS; encouraged to perform ankle pumps and quad sets tonight    Exercises     Assessment/Plan    PT Assessment Patient needs continued PT services  PT Problem List Decreased strength;Pain;Decreased range of motion;Decreased activity tolerance;Decreased knowledge of use of DME;Decreased balance;Decreased mobility;Decreased knowledge of precautions       PT Treatment Interventions DME instruction;Therapeutic exercise;Gait training;Stair training;Functional mobility training;Therapeutic activities;Patient/family education;Balance training    PT Goals (Current goals can be found in the Care Plan  section)  Acute Rehab PT Goals Patient Stated Goal: return home PT Goal Formulation: With patient Time For Goal Achievement: 05/31/22 Potential to Achieve Goals: Good    Frequency 7X/week     Co-evaluation               AM-PAC PT "6 Clicks" Mobility  Outcome Measure Help needed turning from your back to your side while in a flat bed without using bedrails?: A Little Help needed moving from lying on your back to sitting on the side of a flat bed without using bedrails?: A Little Help needed moving to and from a bed to a chair (including a wheelchair)?: A Little Help needed standing up from a chair using your arms (e.g., wheelchair or bedside chair)?: A Little Help needed to walk in hospital room?: A Little Help needed climbing 3-5 steps with a railing? : A Little 6 Click Score: 18    End of Session Equipment Utilized During Treatment: Gait belt Activity Tolerance: Patient tolerated treatment well Patient left: with call bell/phone within reach;with chair alarm set;in chair   PT Visit Diagnosis: Other abnormalities of gait and mobility (R26.89);Muscle weakness (generalized) (M62.81)    Time: [id number] PT Time Calculation (min) (ACUTE ONLY): 30 min   Charges:   PT  Evaluation $PT Eval Low Complexity: 1 Low PT Treatments $Gait Training: 8-22 mins        Abran Richard, PT Acute Rehab [facility] [place] 05/17/2022, 6:12 PM

## 2022-05-17 NOTE — Progress Notes (Signed)
Orthopedic Tech Progress Note Patient Details:  Jennifer Walker [birth year] [id number]  CPM Right Knee CPM Right Knee: Off Right Knee Flexion (Degrees): 40 Right Knee Extension (Degrees): 10  Post Interventions Patient Tolerated: Well  Jennifer Walker 05/17/2022, 5:41 PM

## 2022-05-17 NOTE — Transfer of Care (Signed)
Immediate Anesthesia Transfer of Care Note  Patient: Jennifer Walker  Procedure(s) Performed: TOTAL KNEE ARTHROPLASTY (Right: Knee)  Patient Location: PACU  Anesthesia Type:Spinal  Level of Consciousness: awake, oriented, and patient cooperative  Airway & Oxygen Therapy: Patient Spontanous Breathing and Patient connected to face mask oxygen  Post-op Assessment: Report given to RN and Post -op Vital signs reviewed and stable  Post vital signs: Reviewed  Last Vitals:  Vitals Value Taken Time  BP 98/57 05/17/22 1247  Temp    Pulse 71 05/17/22 1249  Resp 15 05/17/22 1249  SpO2 100 % 05/17/22 1249  Vitals shown include unvalidated device data.  Last Pain:  Vitals:   05/17/22 1105  TempSrc:   PainSc: 0-No pain         Complications: No notable events documented.

## 2022-05-17 NOTE — Anesthesia Procedure Notes (Signed)
Procedure Name: MAC Date/Time: 05/17/2022 11:25 AM  Performed by: Jenne Campus, CRNAPre-anesthesia Checklist: Patient identified, Emergency Drugs available, Suction available and Patient being monitored Oxygen Delivery Method: Simple face mask Placement Confirmation: positive ETCO2

## 2022-05-17 NOTE — Op Note (Signed)
OPERATIVE REPORT-TOTAL KNEE ARTHROPLASTY   Pre-operative diagnosis- Osteoarthritis  Right knee(s)  Post-operative diagnosis- Osteoarthritis Right knee(s)  Procedure-  Right  Total Knee Arthroplasty  Surgeon- Dione Plover. Nohemi Nicklaus, MD  Assistant-Steve Chabon, PA-C  Anesthesia-   Adductor canal block and spinal  EBL- 25 ml   Drains None  Tourniquet time-  Total Tourniquet Time Documented: Thigh (Right) - 35 minutes Total: Thigh (Right) - 35 minutes     Complications- None  Condition-PACU - hemodynamically stable.   Brief Clinical Note  Jennifer Walker is a 67 y.o. year old female with end stage OA of her right knee with progressively worsening pain and dysfunction. She has constant pain, with activity and at rest and significant functional deficits with difficulties even with ADLs. She has had extensive non-op management including analgesics, injections of cortisone and viscosupplements, and home exercise program, but remains in significant pain with significant dysfunction.Radiographs show bone on bone arthritis lateral and patellofemoral. She presents now for right Total Knee Arthroplasty.     Procedure in detail---   The patient is brought into the operating room and positioned supine on the operating table. After successful administration of  Adductor canal block and spinal,   a tourniquet is placed high on the  Right thigh(s) and the lower extremity is prepped and draped in the usual sterile fashion. Time out is performed by the operating team and then the  Right lower extremity is wrapped in Esmarch, knee flexed and the tourniquet inflated to 300 mmHg.       A midline incision is made with a ten blade through the subcutaneous tissue to the level of the extensor mechanism. A fresh blade is used to make a medial parapatellar arthrotomy. Soft tissue over the proximal medial tibia is subperiosteally elevated to the joint line with a knife and into the semimembranosus  bursa with a Cobb elevator. Soft tissue over the proximal lateral tibia is elevated with attention being paid to avoiding the patellar tendon on the tibial tubercle. The patella is everted, knee flexed 90 degrees and the ACL and PCL are removed. Findings are bone on bone lateral and patellofemoral with large global osteophytes        The drill is used to create a starting hole in the distal femur and the canal is thoroughly irrigated with sterile saline to remove the fatty contents. The 5 degree Right  valgus alignment guide is placed into the femoral canal and the distal femoral cutting block is pinned to remove 9 mm off the distal femur. Resection is made with an oscillating saw.      The tibia is subluxed forward and the menisci are removed. The extramedullary alignment guide is placed referencing proximally at the medial aspect of the tibial tubercle and distally along the second metatarsal axis and tibial crest. The block is pinned to remove 58m off the more deficient lateral  side. Resection is made with an oscillating saw. Size 7is the most appropriate size for the tibia and the proximal tibia is prepared with the modular drill and keel punch for that size.      The femoral sizing guide is placed and size 7 is most appropriate. Rotation is marked off the epicondylar axis and confirmed by creating a rectangular flexion gap at 90 degrees. The size 7 cutting block is pinned in this rotation and the anterior, posterior and chamfer cuts are made with the oscillating saw. The intercondylar block is then placed and that cut is made.  Trial size 7 tibial component, trial size 7 posterior stabilized femur and a 8  mm posterior stabilized rotating platform insert trial is placed. Full extension is achieved with excellent varus/valgus and anterior/posterior balance throughout full range of motion. The patella is everted and thickness measured to be 24  mm. Free hand resection is taken to 14 mm, a 38 template is  placed, lug holes are drilled, trial patella is placed, and it tracks normally. Osteophytes are removed off the posterior femur with the trial in place. All trials are removed and the cut bone surfaces prepared with pulsatile lavage. Cement is mixed and once ready for implantation, the size 7 tibial implant, size  7 posterior stabilized femoral component, and the size 38 patella are cemented in place and the patella is held with the clamp. The trial insert is placed and the knee held in full extension. The Exparel (20 ml mixed with 60 ml saline) is injected into the extensor mechanism, posterior capsule, medial and lateral gutters and subcutaneous tissues.  All extruded cement is removed and once the cement is hard the permanent 8 mm posterior stabilized rotating platform insert is placed into the tibial tray.      The wound is copiously irrigated with saline solution and the extensor mechanism closed with # 0 Stratofix suture. The tourniquet is released for a total tourniquet time of 35  minutes. Flexion against gravity is 130 degrees and the patella tracks normally. Subcutaneous tissue is closed with 2.0 vicryl and subcuticular with running 4.0 Monocryl. The incision is cleaned and dried and steri-strips and a bulky sterile dressing are applied. The limb is placed into a knee immobilizer and the patient is awakened and transported to recovery in stable condition.      Please note that a surgical assistant was a medical necessity for this procedure in order to perform it in a safe and expeditious manner. Surgical assistant was necessary to retract the ligaments and vital neurovascular structures to prevent injury to them and also necessary for proper positioning of the limb to allow for anatomic placement of the prosthesis.   Dione Plover Jizel Cheeks, MD    05/17/2022, 12:23 PM

## 2022-05-17 NOTE — Anesthesia Procedure Notes (Signed)
Spinal  Patient location during procedure: OR Start time: 05/17/2022 11:20 AM End time: 05/17/2022 11:25 AM Reason for block: surgical anesthesia Staffing Performed: resident/CRNA  Resident/CRNA: Jenne Campus, CRNA Performed by: Jenne Campus, CRNA Authorized by: Barnet Glasgow, MD   Preanesthetic Checklist Completed: patient identified, IV checked, site marked, risks and benefits discussed, surgical consent, monitors and equipment checked, pre-op evaluation and timeout performed Spinal Block Patient position: sitting Prep: DuraPrep Patient monitoring: heart rate, cardiac monitor, continuous pulse ox and blood pressure Approach: midline Location: L3-4 Injection technique: single-shot Needle Needle type: Pencan  Needle gauge: 24 G Needle length: 10 cm Assessment Sensory level: T6 Events: CSF return Additional Notes IV functioning, monitors applied to pt. Expiration date of kit checked and confirmed to be in date.  Sterile prep and drape, including hand hygiene, mask and sterile gloves were used. The patient was positioned and the spine was prepped. Skin was anesthetized with lidocaine. Free flow of clear CSF obtained prior to injecting local anesthetic into the CSF. Spinal needle aspirated freely following injection. Needle was carefully withdrawn, and pt tolerated procedure well. Loss of motor and sensory on exam post injection.

## 2022-05-17 NOTE — Progress Notes (Signed)
Orthopedic Tech Progress Note Patient Details:  Jennifer Walker [facility] 10/25/1955 [id number]  CPM Right Knee CPM Right Knee: On Right Knee Flexion (Degrees): 40 Right Knee Extension (Degrees): 10  Post Interventions Patient Tolerated: Well  Vernona Rieger 05/17/2022, 1:39 PM

## 2022-05-18 ENCOUNTER — Encounter (HOSPITAL_COMMUNITY): Payer: Self-pay | Admitting: Orthopedic Surgery

## 2022-05-18 DIAGNOSIS — M1711 Unilateral primary osteoarthritis, right knee: Secondary | ICD-10-CM | POA: Diagnosis not present

## 2022-05-18 LAB — BASIC METABOLIC PANEL
Anion gap: 6 (ref 5–15)
BUN: 14 mg/dL (ref 8–23)
CO2: 24 mmol/L (ref 22–32)
Calcium: 7.6 mg/dL — ABNORMAL LOW (ref 8.9–10.3)
Chloride: 104 mmol/L (ref 98–111)
Creatinine, Ser: 0.73 mg/dL (ref 0.44–1.00)
GFR, Estimated: >60 mL/min (ref >60)
Glucose, Bld: 121 mg/dL — ABNORMAL HIGH (ref 70–99)
Potassium: 3.9 mmol/L (ref 3.5–5.1)
Sodium: 134 mmol/L — ABNORMAL LOW (ref 135–145)

## 2022-05-18 LAB — CBC
HCT: 29.3 % — ABNORMAL LOW (ref 36.0–46.0)
Hemoglobin: 9.6 g/dL — ABNORMAL LOW (ref 12.0–15.0)
MCH: 31.8 pg (ref 26.0–34.0)
MCHC: 32.8 g/dL (ref 30.0–36.0)
MCV: 97 fL (ref 80.0–100.0)
Platelets: 215 10*3/uL (ref 150–400)
RBC: 3.02 MIL/uL — ABNORMAL LOW (ref 3.87–5.11)
RDW: 12.1 % (ref 11.5–15.5)
WBC: 8.7 10*3/uL (ref 4.0–10.5)
nRBC: 0 % (ref 0.0–0.2)

## 2022-05-18 MED ORDER — METHOCARBAMOL 500 MG PO TABS: 500.0000 mg | ORAL_TABLET | Freq: Four times a day (QID) | ORAL | 0 refills | Status: DC | PRN

## 2022-05-18 MED ORDER — GABAPENTIN 300 MG PO CAPS: 300.0000 mg | ORAL_CAPSULE | ORAL | 0 refills | Status: DC

## 2022-05-18 MED ORDER — OXYCODONE HCL 5 MG PO TABS: 5.0000 mg | ORAL_TABLET | Freq: Four times a day (QID) | ORAL | 0 refills | Status: DC | PRN

## 2022-05-18 MED ORDER — TRAMADOL HCL 50 MG PO TABS: 50.0000 mg | ORAL_TABLET | Freq: Four times a day (QID) | ORAL | 0 refills | Status: DC | PRN

## 2022-05-18 MED ORDER — RIVAROXABAN 10 MG PO TABS: 10.0000 mg | ORAL_TABLET | Freq: Every day | ORAL | 0 refills | Status: AC

## 2022-05-18 NOTE — Progress Notes (Signed)
Physical Therapy Treatment Patient Details Name: Mazelle Gunder MRN: [id number] DOB: [birth year] Today's Date: 05/18/2022   History of Present Illness Pt is 67 yo female s/p R TKA on 05/17/22.  Pt with hx including arthritis , prior knee surgery, breast lumpectomy    PT Comments    Pt is POD # 1 and is progressing well.  Pt with excellent pain control, quad activation, and ROM.  Pt very motivated and expected to progress well.  She ambulated 100' with supervision using RW Pt demonstrates safe gait & transfers in order to return home from PT perspective once discharged by MD.  While in hospital, will continue to benefit from PT for skilled therapy to advance mobility and exercises.      Recommendations for follow up therapy are one component of a multi-disciplinary discharge planning process, led by the attending physician.  Recommendations may be updated based on patient status, additional functional criteria and insurance authorization.  Follow Up Recommendations  Follow physician's recommendations for discharge plan and follow up therapies     Assistance Recommended at Discharge Intermittent Supervision/Assistance  Patient can return home with the following A little help with walking and/or transfers;A little help with bathing/dressing/bathroom;Assistance with cooking/housework;Help with stairs or ramp for entrance   Equipment Recommendations  Rolling walker (2 wheels)    Recommendations for Other Services       Precautions / Restrictions Precautions Precautions: Fall;Knee Required Braces or Orthoses: Knee Immobilizer - Right Knee Immobilizer - Right: Discontinue once straight leg raise with < 10 degree lag Restrictions RLE Weight Bearing: Weight bearing as tolerated     Mobility  Bed Mobility               General bed mobility comments: in chair    Transfers Overall transfer level: Needs assistance Equipment used: Rolling walker (2 wheels) Transfers:  Sit to/from Stand Sit to Stand: Supervision           General transfer comment: performed safely    Ambulation/Gait Ambulation/Gait assistance: Supervision Gait Distance (Feet): 100 Feet Assistive device: Rolling walker (2 wheels) Gait Pattern/deviations: Step-to pattern, Decreased stride length, Decreased weight shift to right Gait velocity: decraesed     General Gait Details: Good carry over of sequencing and RW proximity; adjusted home RW to correct height   Stairs Stairs: Yes Stairs assistance: Min guard Stair Management: Two rails, Step to pattern Number of Stairs: 10 General stair comments: performed without difficulty with 2 rails; pt plans to drive to basement door and not do stair as she does not have rails   Wheelchair Mobility    Modified Rankin (Stroke Patients Only)       Balance Overall balance assessment: Needs assistance Sitting-balance support: No upper extremity supported Sitting balance-Leahy Scale: Normal     Standing balance support: Bilateral upper extremity supported, No upper extremity supported Standing balance-Leahy Scale: Fair Standing balance comment: RW to ambulate but could static stand without support                            Cognition Arousal/Alertness: Awake/alert Behavior During Therapy: WFL for tasks assessed/performed Overall Cognitive Status: Within Functional Limits for tasks assessed                                          Exercises Total Joint Exercises Ankle Circles/Pumps: AROM, Both,  15 reps, Seated Quad Sets: AROM, Both, 10 reps, Seated Heel Slides: AAROM, Right, Seated, 5 reps (gait belt for AAROM) Hip ABduction/ADduction: AAROM, Right, 5 reps, Seated (gait belt for AAROM) Long Arc Quad: AROM, Right, 10 reps, Seated Knee Flexion: AROM, Right, 10 reps, Seated Goniometric ROM: R knee 5 to 80 degrees Other Exercises Other Exercises: Pt very aware of importance of knee ext and was  perfoming low load long duration stretch at arrival (propt on pillow under foot)    General Comments  Educated on safe ice use, no pivots, car transfers, resting with leg straight, and TED hose during day. Also, encouraged walking (5-10 mins) every 1-2 hours during day. Educated on HEP with focus on mobility the first weeks. Discussed doing exercises within pain control and if pain increasing could decreased ROM, reps, and stop exercises as needed. Encouraged to perform quad sets and ankle pumps frequently for blood flow and to promote full knee extension.        Pertinent Vitals/Pain Pain Assessment Pain Assessment: 0-10 Pain Score:  (0/10 pre/post and 6/10 with exercises and walking) Pain Location: R knee Pain Descriptors / Indicators: Discomfort Pain Intervention(s): Limited activity within patient's tolerance, Monitored during session, Premedicated before session, Ice applied    Home Living                          Prior Function            PT Goals (current goals can now be found in the care plan section) Progress towards PT goals: Progressing toward goals    Frequency    7X/week      PT Plan Current plan remains appropriate    Co-evaluation              AM-PAC PT "6 Clicks" Mobility   Outcome Measure  Help needed turning from your back to your side while in a flat bed without using bedrails?: None Help needed moving from lying on your back to sitting on the side of a flat bed without using bedrails?: None Help needed moving to and from a bed to a chair (including a wheelchair)?: A Little Help needed standing up from a chair using your arms (e.g., wheelchair or bedside chair)?: A Little Help needed to walk in hospital room?: A Little Help needed climbing 3-5 steps with a railing? : A Little 6 Click Score: 20    End of Session Equipment Utilized During Treatment: Gait belt Activity Tolerance: Patient tolerated treatment well Patient left: with  call bell/phone within reach;with chair alarm set;in chair   PT Visit Diagnosis: Other abnormalities of gait and mobility (R26.89);Muscle weakness (generalized) (M62.81)     Time: [id number] PT Time Calculation (min) (ACUTE ONLY): 30 min  Charges:  $Gait Training: 8-22 mins $Therapeutic Exercise: 8-22 mins                     Abran Richard, PT Acute Rehab [facility] [place] 05/18/2022, 10:57 AM

## 2022-05-18 NOTE — Progress Notes (Signed)
Pt discharged to home by Nira Conn, RN. Please see nurse's note for detail.

## 2022-05-18 NOTE — Plan of Care (Signed)

## 2022-05-18 NOTE — Discharge Summary (Signed)
Physician Discharge Summary   Patient ID: Jennifer Walker MRN: [id number] DOB/AGE: 65-Apr-1957 65 y.o.  Admit date: 05/17/2022 Discharge date: 05/18/2022  Primary Diagnosis: Osteoarthritis right knee   Admission Diagnoses:  Past Medical History:  Diagnosis Date   Abnormal Pap smear of cervix    yrs ago, just a repeat done   Arthritis    Breast cancer ([place])    Invasive ductal ca grade 1 er/pr positive, her2  negative   Family history of cancer of mouth    Family history of lung cancer    Family history of prostate cancer    Fatigue    H/O colonoscopy 2003   Hot flashes    Insomnia    Personal history of radiation therapy    PONV (postoperative nausea and vomiting)    Psoriasis    Wears glasses    Discharge Diagnoses:   Principal Problem:   OA (osteoarthritis) of knee Active Problems:   Osteoarthritis of right knee  Estimated body mass index is 24.82 kg/m as calculated from the following:   Height as of this encounter: 5' 10"$  (1.778 m).   Weight as of this encounter: 78.5 kg.  Procedure:  Procedure(s) (LRB): TOTAL KNEE ARTHROPLASTY (Right)   Consults: None  HPI: Jennifer Walker is a 67 y.o. year old female with end stage OA of her right knee with progressively worsening pain and dysfunction. She has constant pain, with activity and at rest and significant functional deficits with difficulties even with ADLs. She has had extensive non-op management including analgesics, injections of cortisone and viscosupplements, and home exercise program, but remains in significant pain with significant dysfunction. Radiographs show bone on bone arthritis lateral and patellofemoral. She presents now for right Total Knee Arthroplasty.  Laboratory Data: Admission on 05/17/2022, Discharged on 05/18/2022  Component Date Value Ref Range Status   WBC 05/18/2022 8.7  4.0 - 10.5 K/uL Final   RBC 05/18/2022 3.02 (L)  3.87 - 5.11 MIL/uL Final   Hemoglobin 05/18/2022  9.6 (L)  12.0 - 15.0 g/dL Final   HCT 05/18/2022 29.3 (L)  36.0 - 46.0 % Final   MCV 05/18/2022 97.0  80.0 - 100.0 fL Final   MCH 05/18/2022 31.8  26.0 - 34.0 pg Final   MCHC 05/18/2022 32.8  30.0 - 36.0 g/dL Final   RDW 05/18/2022 12.1  11.5 - 15.5 % Final   Platelets 05/18/2022 215  150 - 400 K/uL Final   nRBC 05/18/2022 0.0  0.0 - 0.2 % Final   Performed at [facility], [place] [address] [place], [place] [address]   Sodium 05/18/2022 134 (L)  135 - 145 mmol/L Final   Potassium 05/18/2022 3.9  3.5 - 5.1 mmol/L Final   Chloride 05/18/2022 104  98 - 111 mmol/L Final   CO2 05/18/2022 24  22 - 32 mmol/L Final   Glucose, Bld 05/18/2022 121 (H)  70 - 99 mg/dL Final   Glucose reference range applies only to samples taken after fasting for at least 8 hours.   BUN 05/18/2022 14  8 - 23 mg/dL Final   Creatinine, Ser 05/18/2022 0.73  0.44 - 1.00 mg/dL Final   Calcium 05/18/2022 7.6 (L)  8.9 - 10.3 mg/dL Final   GFR, Estimated 05/18/2022 >60  >60 mL/min Final   Comment: (NOTE) Calculated using the CKD-EPI Creatinine Equation (2021)    Anion gap 05/18/2022 6  5 - 15 Final   Performed at [facility], [place] [address]  [place], [place] [address]  Hospital Outpatient Visit on 05/05/2022  Component Date Value Ref Range Status   Sodium 05/05/2022 140  135 - 145 mmol/L Final   Potassium 05/05/2022 4.3  3.5 - 5.1 mmol/L Final   Chloride 05/05/2022 102  98 - 111 mmol/L Final   CO2 05/05/2022 26  22 - 32 mmol/L Final   Glucose, Bld 05/05/2022 107 (H)  70 - 99 mg/dL Final   Glucose reference range applies only to samples taken after fasting for at least 8 hours.   BUN 05/05/2022 15  8 - 23 mg/dL Final   Creatinine, Ser 05/05/2022 0.87  0.44 - 1.00 mg/dL Final   Calcium 05/05/2022 9.2  8.9 - 10.3 mg/dL Final   GFR, Estimated 05/05/2022 >60  >60 mL/min Final   Comment: (NOTE) Calculated using the CKD-EPI Creatinine Equation (2021)    Anion gap 05/05/2022 12  5 - 15  Final   Performed at [facility], [place] [address], [place], [place] [address]   WBC 05/05/2022 4.4  4.0 - 10.5 K/uL Final   RBC 05/05/2022 4.26  3.87 - 5.11 MIL/uL Final   Hemoglobin 05/05/2022 13.3  12.0 - 15.0 g/dL Final   HCT 05/05/2022 41.1  36.0 - 46.0 % Final   MCV 05/05/2022 96.5  80.0 - 100.0 fL Final   MCH 05/05/2022 31.2  26.0 - 34.0 pg Final   MCHC 05/05/2022 32.4  30.0 - 36.0 g/dL Final   RDW 05/05/2022 12.1  11.5 - 15.5 % Final   Platelets 05/05/2022 278  150 - 400 K/uL Final   nRBC 05/05/2022 0.0  0.0 - 0.2 % Final   Performed at [facility], [place] [address], [place], [place] [address]   MRSA, PCR 05/05/2022 NEGATIVE  NEGATIVE Final   Staphylococcus aureus 05/05/2022 POSITIVE (A)  NEGATIVE Final   Comment: (NOTE) The Xpert SA Assay (FDA approved for NASAL specimens in patients 60 years of age and older), is one component of a comprehensive surveillance program. It is not intended to diagnose infection nor to guide or monitor treatment. Performed at [facility], [place] [address], [place], [place] [address]      X-Rays:No results found.  EKG:No orders found for this or any previous visit.   Hospital Course: Jennifer Walker is a 67 y.o. who was admitted to [facility]. They were brought to the operating room on 05/17/2022 and underwent Procedure(s): TOTAL KNEE ARTHROPLASTY.  Patient tolerated the procedure well and was later transferred to the recovery room and then to the orthopaedic floor for postoperative care. They were given PO and IV analgesics for pain control following their surgery. They were given 24 hours of postoperative antibiotics of  Anti-infectives (From admission, onward)    Start     Dose/Rate Route Frequency Ordered Stop   05/17/22 1800  ceFAZolin (ANCEF) IVPB 2g/100 mL premix        2 g 200 mL/hr over 30 Minutes Intravenous Every 6 hours 05/17/22 1412 05/18/22 0057    05/17/22 0930  ceFAZolin (ANCEF) IVPB 2g/100 mL premix        2 g 200 mL/hr over 30 Minutes Intravenous On call to O.R. 05/17/22 [id number] 05/17/22 1128      and started on DVT prophylaxis in the form of Xarelto.   Jennifer Walker and OT were ordered for total joint protocol. Discharge planning consulted to help with postop disposition and equipment needs.  Patient had a good night on the evening  of surgery. They started to get up OOB with therapy on POD #0. Jennifer Walker was seen during rounds and was ready to go home pending progress with therapy. She worked with therapy on POD #1 and was meeting her goals. Jennifer Walker was discharged to home later that day in stable condition.  Diet: Regular diet Activity: WBAT Follow-up: in 2 weeks Disposition: Home Discharged Condition: stable   Discharge Instructions     Call MD / Call 911   Complete by: As directed    If you experience chest pain or shortness of breath, CALL 911 and be transported to the hospital emergency room.  If you develope a fever above 101 F, pus (white drainage) or increased drainage or redness at the wound, or calf pain, call your surgeon's office.   Change dressing   Complete by: As directed    You may remove the bulky bandage (ACE wrap and gauze) two days after surgery. You will have an adhesive waterproof bandage underneath. Leave this in place until your first follow-up appointment.   Constipation Prevention   Complete by: As directed    Drink plenty of fluids.  Prune juice may be helpful.  You may use a stool softener, such as Colace (over the counter) 100 mg twice a day.  Use MiraLax (over the counter) for constipation as needed.   Diet - low sodium heart healthy   Complete by: As directed    Do not put a pillow under the knee. Place it under the heel.   Complete by: As directed    Driving restrictions   Complete by: As directed    No driving for two weeks   Post-operative opioid taper instructions:   Complete by: As directed    POST-OPERATIVE  OPIOID TAPER INSTRUCTIONS: It is important to wean off of your opioid medication as soon as possible. If you do not need pain medication after your surgery it is ok to stop day one. Opioids include: Codeine, Hydrocodone(Norco, Vicodin), Oxycodone(Percocet, oxycontin) and hydromorphone amongst others.  Long term and even short term use of opiods can cause: Increased pain response Dependence Constipation Depression Respiratory depression And more.  Withdrawal symptoms can include Flu like symptoms Nausea, vomiting And more Techniques to manage these symptoms Hydrate well Eat regular healthy meals Stay active Use relaxation techniques(deep breathing, meditating, yoga) Do Not substitute Alcohol to help with tapering If you have been on opioids for less than two weeks and do not have pain than it is ok to stop all together.  Plan to wean off of opioids This plan should start within one week post op of your joint replacement. Maintain the same interval or time between taking each dose and first decrease the dose.  Cut the total daily intake of opioids by one tablet each day Next start to increase the time between doses. The last dose that should be eliminated is the evening dose.      TED hose   Complete by: As directed    Use stockings (TED hose) for three weeks on both leg(s).  You may remove them at night for sleeping.   Weight bearing as tolerated   Complete by: As directed       Allergies as of 05/18/2022       Reactions   Sulfa Antibiotics Other (See Comments)   Yeast infections and ulcers   Sulfa Drugs Cross Reactors Other (See Comments)   Yeast infections and ulcers   Tape Rash   Turmeric Rash  Medication List     STOP taking these medications    betamethasone valerate ointment 0.1 % Commonly known as: VALISONE   ibuprofen 200 MG tablet Commonly known as: ADVIL   naproxen sodium 220 MG tablet Commonly known as: ALEVE       TAKE these  medications    acetaminophen 500 MG tablet Commonly known as: TYLENOL Take 1,000 mg by mouth every 6 (six) hours as needed (PAIN.).   calcipotriene-betamethasone ointment Commonly known as: TACLONEX Apply 1 application topically daily as needed (psoriasis).   Cannabidiol Powd Apply 1 Application topically as needed (pain.). TOPICAL CBD   gabapentin 300 MG capsule Commonly known as: NEURONTIN Take 1 capsule (300 mg total) by mouth as directed. Take a 300 mg capsule three times a day for two weeks following surgery.Then take a 300 mg capsule two times a day for two weeks. Then take a 300 mg capsule once a day for two weeks. Then discontinue.   melatonin 5 MG Tabs Take 5 mg by mouth at bedtime.   methocarbamol 500 MG tablet Commonly known as: ROBAXIN Take 1 tablet (500 mg total) by mouth every 6 (six) hours as needed for muscle spasms.   multivitamin with minerals Tabs tablet Take 1 tablet by mouth 3 (three) times a week. Centrum Ultra Women's Multivitamin   oxyCODONE 5 MG immediate release tablet Commonly known as: Oxy IR/ROXICODONE Take 1-2 tablets (5-10 mg total) by mouth every 6 (six) hours as needed for severe pain.   rivaroxaban 10 MG Tabs tablet Commonly known as: XARELTO Take 1 tablet (10 mg total) by mouth daily with breakfast for 20 days. Then take one 81 mg aspirin once a day for three weeks. Then discontinue aspirin.   traMADol 50 MG tablet Commonly known as: ULTRAM Take 1-2 tablets (50-100 mg total) by mouth every 6 (six) hours as needed for moderate pain.   Zoryve 0.3 % Crea Generic drug: Roflumilast Apply 1 Application topically daily as needed (psoriasis).               Discharge Care Instructions  (From admission, onward)           Start     Ordered   05/18/22 0000  Weight bearing as tolerated        05/18/22 0740   05/18/22 0000  Change dressing       Comments: You may remove the bulky bandage (ACE wrap and gauze) two days after surgery.  You will have an adhesive waterproof bandage underneath. Leave this in place until your first follow-up appointment.   05/18/22 0740            Follow-up Information     Gaynelle Arabian, MD. Schedule an appointment as soon as possible for a visit in 2 week(s).   Specialty: Orthopedic Surgery Contact information: [address] [place] [address] [phone]                 Signed: R. Jaynie Bream, PA-C Orthopedic Surgery 05/18/2022, 8:04 PM

## 2022-05-18 NOTE — Progress Notes (Signed)
Subjective: 1 Day Post-Op Procedure(s) (LRB): TOTAL KNEE ARTHROPLASTY (Right) Patient seen in rounds by Dr. Wynelle Link. Patient is well, and has had no acute complaints or problems. Denies SOB or chest pain. Denies calf pain. Foley cath removed this AM. Patient reports pain as mild. Worked with physical therapy yesterday and ambulated 75'. We will continue physical therapy today.  Objective: Vital signs in last 24 hours: Temp:  [97.6 F (36.4 C)-98.5 F (36.9 C)] 98.5 F (36.9 C) (02/20 0511) Pulse Rate:  [53-70] 66 (02/20 0511) Resp:  [11-20] 20 (02/20 0511) BP: (98-138)/(57-83) 109/67 (02/20 0511) SpO2:  [96 %-100 %] 98 % (02/20 0511) Weight:  [78.5 kg] 78.5 kg (02/19 0944)  Intake/Output from previous day:  Intake/Output Summary (Last 24 hours) at 05/18/2022 0736 Last data filed at 05/18/2022 0600 Gross per 24 hour  Intake 4480.67 ml  Output 3850 ml  Net 630.67 ml     Intake/Output this shift: No intake/output data recorded.  Labs: Recent Labs    05/18/22 0334  HGB 9.6*   Recent Labs    05/18/22 0334  WBC 8.7  RBC 3.02*  HCT 29.3*  PLT 215   Recent Labs    05/18/22 0334  NA 134*  K 3.9  CL 104  CO2 24  BUN 14  CREATININE 0.73  GLUCOSE 121*  CALCIUM 7.6*   No results for input(s): "LABPT", "INR" in the last 72 hours.  Exam: General - Patient is Alert and Oriented Extremity - Neurologically intact Neurovascular intact Sensation intact distally Dorsiflexion/Plantar flexion intact Dressing - dressing C/D/I Motor Function - intact, moving foot and toes well on exam.  Past Medical History:  Diagnosis Date   Abnormal Pap smear of cervix    yrs ago, just a repeat done   Arthritis    Breast cancer ([place])    Invasive ductal ca grade 1 er/pr positive, her2  negative   Family history of cancer of mouth    Family history of lung cancer    Family history of prostate cancer    Fatigue    H/O colonoscopy 2003   Hot flashes    Insomnia    Personal  history of radiation therapy    PONV (postoperative nausea and vomiting)    Psoriasis    Wears glasses     Assessment/Plan: 1 Day Post-Op Procedure(s) (LRB): TOTAL KNEE ARTHROPLASTY (Right) Principal Problem:   OA (osteoarthritis) of knee Active Problems:   Osteoarthritis of right knee  Estimated body mass index is 24.82 kg/m as calculated from the following:   Height as of this encounter: 5' 10"$  (1.778 m).   Weight as of this encounter: 78.5 kg. Advance diet Up with therapy D/C IV fluids  Patient's anticipated LOS is less than 2 midnights, meeting these requirements: - Lives within 1 hour of care - Has a competent adult at home to recover with post-op - NO history of  - Chronic pain requiring opiods  - Diabetes  - Coronary Artery Disease  - Heart failure  - Heart attack  - Stroke  - DVT/VTE  - Cardiac arrhythmia  - Respiratory Failure/COPD  - Renal failure  - Anemia  - Advanced Liver disease  DVT Prophylaxis - Xarelto Weight bearing as tolerated.  Continue physical therapy. Expected discharge home today pending progress with physical therapy and if meeting patient goals. Scheduled for OPPT at [facility]. Follow-up in clinic in 2 weeks.   The PDMP database was reviewed today prior to any opioid medications being  prescribed to this patient.  R. Jaynie Bream, PA-C Orthopedic Surgery [phone] 05/18/2022, 7:36 AM

## 2022-05-18 NOTE — TOC Transition Note (Signed)
Transition of Care [facility]) - CM/SW Discharge Note   Patient Details  Name: Jennifer Walker MRN: [id number] Date of Birth: 05/22/1955  [facility] of [facility]) CM/SW Contact:  Lennart Pall, LCSW Phone Number: 05/18/2022, 10:59 AM   Clinical Narrative:     Met with pt and confirming she has received RW to room via [place].  OPPT already arranged with [facility].  No further TOC needs.  Final next level of care: OP Rehab Barriers to Discharge: No Barriers Identified   Patient Goals and CMS Choice      Discharge Placement                         Discharge Plan and Services Additional resources added to the After Visit Summary for                  DME Arranged: Walker rolling DME Agency: [place]                  Social Determinants of Health (SDOH) Interventions SDOH Screenings   Food Insecurity: No Food Insecurity (05/17/2022)  Housing: Low Risk  (05/17/2022)  Transportation Needs: No Transportation Needs (05/17/2022)  Utilities: Not At Risk (05/17/2022)  Tobacco Use: Low Risk  (05/18/2022)     Readmission Risk Interventions     No data to display

## 2022-09-27 DIAGNOSIS — D72819 Decreased white blood cell count, unspecified: Secondary | ICD-10-CM | POA: Diagnosis not present

## 2022-09-27 DIAGNOSIS — R5383 Other fatigue: Secondary | ICD-10-CM | POA: Diagnosis not present

## 2022-09-27 DIAGNOSIS — R739 Hyperglycemia, unspecified: Secondary | ICD-10-CM | POA: Diagnosis not present

## 2022-09-27 DIAGNOSIS — R946 Abnormal results of thyroid function studies: Secondary | ICD-10-CM | POA: Diagnosis not present

## 2022-09-27 DIAGNOSIS — M8580 Other specified disorders of bone density and structure, unspecified site: Secondary | ICD-10-CM | POA: Diagnosis not present

## 2022-10-04 DIAGNOSIS — R82998 Other abnormal findings in urine: Secondary | ICD-10-CM | POA: Diagnosis not present

## 2022-10-04 DIAGNOSIS — Z0000 Encounter for general adult medical examination without abnormal findings: Secondary | ICD-10-CM | POA: Diagnosis not present

## 2022-10-04 DIAGNOSIS — R0789 Other chest pain: Secondary | ICD-10-CM | POA: Diagnosis not present

## 2022-10-04 DIAGNOSIS — Z853 Personal history of malignant neoplasm of breast: Secondary | ICD-10-CM | POA: Diagnosis not present

## 2022-10-04 DIAGNOSIS — R5383 Other fatigue: Secondary | ICD-10-CM | POA: Diagnosis not present

## 2022-10-04 DIAGNOSIS — Z1331 Encounter for screening for depression: Secondary | ICD-10-CM | POA: Diagnosis not present

## 2022-10-04 DIAGNOSIS — R739 Hyperglycemia, unspecified: Secondary | ICD-10-CM | POA: Diagnosis not present

## 2022-10-04 DIAGNOSIS — Z23 Encounter for immunization: Secondary | ICD-10-CM | POA: Diagnosis not present

## 2022-10-04 DIAGNOSIS — E663 Overweight: Secondary | ICD-10-CM | POA: Diagnosis not present

## 2022-10-04 DIAGNOSIS — M8580 Other specified disorders of bone density and structure, unspecified site: Secondary | ICD-10-CM | POA: Diagnosis not present

## 2022-10-04 DIAGNOSIS — Z1212 Encounter for screening for malignant neoplasm of rectum: Secondary | ICD-10-CM | POA: Diagnosis not present

## 2022-10-04 DIAGNOSIS — G4700 Insomnia, unspecified: Secondary | ICD-10-CM | POA: Diagnosis not present

## 2022-10-04 DIAGNOSIS — I493 Ventricular premature depolarization: Secondary | ICD-10-CM | POA: Diagnosis not present

## 2022-10-04 DIAGNOSIS — R002 Palpitations: Secondary | ICD-10-CM | POA: Diagnosis not present

## 2022-10-04 DIAGNOSIS — M1990 Unspecified osteoarthritis, unspecified site: Secondary | ICD-10-CM | POA: Diagnosis not present

## 2022-10-04 DIAGNOSIS — N3281 Overactive bladder: Secondary | ICD-10-CM | POA: Diagnosis not present

## 2022-11-03 ENCOUNTER — Other Ambulatory Visit: Payer: Self-pay | Admitting: Internal Medicine

## 2022-11-03 DIAGNOSIS — Z1231 Encounter for screening mammogram for malignant neoplasm of breast: Secondary | ICD-10-CM

## 2022-11-17 DIAGNOSIS — R69 Illness, unspecified: Secondary | ICD-10-CM | POA: Diagnosis not present

## 2022-12-01 DIAGNOSIS — R69 Illness, unspecified: Secondary | ICD-10-CM | POA: Diagnosis not present

## 2022-12-08 DIAGNOSIS — R69 Illness, unspecified: Secondary | ICD-10-CM | POA: Diagnosis not present

## 2022-12-20 ENCOUNTER — Ambulatory Visit
Admission: RE | Admit: 2022-12-20 | Discharge: 2022-12-20 | Disposition: A | Discharge status: Home / Self Care | Payer: Medicare HMO | Source: Ambulatory Visit | Attending: Internal Medicine | Admitting: Internal Medicine

## 2022-12-20 DIAGNOSIS — Z1231 Encounter for screening mammogram for malignant neoplasm of breast: Secondary | ICD-10-CM | POA: Diagnosis not present

## 2022-12-22 DIAGNOSIS — R69 Illness, unspecified: Secondary | ICD-10-CM | POA: Diagnosis not present

## 2022-12-23 DIAGNOSIS — Z882 Allergy status to sulfonamides status: Secondary | ICD-10-CM | POA: Diagnosis not present

## 2022-12-23 DIAGNOSIS — M1990 Unspecified osteoarthritis, unspecified site: Secondary | ICD-10-CM | POA: Diagnosis not present

## 2022-12-23 DIAGNOSIS — L309 Dermatitis, unspecified: Secondary | ICD-10-CM | POA: Diagnosis not present

## 2022-12-23 DIAGNOSIS — Z96651 Presence of right artificial knee joint: Secondary | ICD-10-CM | POA: Diagnosis not present

## 2022-12-23 DIAGNOSIS — Z809 Family history of malignant neoplasm, unspecified: Secondary | ICD-10-CM | POA: Diagnosis not present

## 2022-12-23 DIAGNOSIS — Z008 Encounter for other general examination: Secondary | ICD-10-CM | POA: Diagnosis not present

## 2022-12-23 DIAGNOSIS — Z853 Personal history of malignant neoplasm of breast: Secondary | ICD-10-CM | POA: Diagnosis not present

## 2022-12-23 DIAGNOSIS — Z888 Allergy status to other drugs, medicaments and biological substances status: Secondary | ICD-10-CM | POA: Diagnosis not present

## 2022-12-23 DIAGNOSIS — R32 Unspecified urinary incontinence: Secondary | ICD-10-CM | POA: Diagnosis not present

## 2022-12-23 DIAGNOSIS — I872 Venous insufficiency (chronic) (peripheral): Secondary | ICD-10-CM | POA: Diagnosis not present

## 2022-12-23 DIAGNOSIS — Z8249 Family history of ischemic heart disease and other diseases of the circulatory system: Secondary | ICD-10-CM | POA: Diagnosis not present

## 2022-12-23 DIAGNOSIS — L409 Psoriasis, unspecified: Secondary | ICD-10-CM | POA: Diagnosis not present

## 2022-12-23 DIAGNOSIS — R69 Illness, unspecified: Secondary | ICD-10-CM | POA: Diagnosis not present

## 2022-12-23 DIAGNOSIS — I739 Peripheral vascular disease, unspecified: Secondary | ICD-10-CM | POA: Diagnosis not present

## 2022-12-27 DIAGNOSIS — R69 Illness, unspecified: Secondary | ICD-10-CM | POA: Diagnosis not present

## 2023-01-04 DIAGNOSIS — R69 Illness, unspecified: Secondary | ICD-10-CM | POA: Diagnosis not present

## 2023-01-12 DIAGNOSIS — R69 Illness, unspecified: Secondary | ICD-10-CM | POA: Diagnosis not present

## 2023-01-14 DIAGNOSIS — R69 Illness, unspecified: Secondary | ICD-10-CM | POA: Diagnosis not present

## 2023-02-09 ENCOUNTER — Ambulatory Visit: Payer: No Typology Code available for payment source | Admitting: Obstetrics and Gynecology

## 2023-02-09 DIAGNOSIS — L400 Psoriasis vulgaris: Secondary | ICD-10-CM | POA: Diagnosis not present

## 2023-02-09 DIAGNOSIS — L821 Other seborrheic keratosis: Secondary | ICD-10-CM | POA: Diagnosis not present

## 2023-02-09 DIAGNOSIS — D225 Melanocytic nevi of trunk: Secondary | ICD-10-CM | POA: Diagnosis not present

## 2023-02-09 DIAGNOSIS — L814 Other melanin hyperpigmentation: Secondary | ICD-10-CM | POA: Diagnosis not present

## 2023-05-09 DIAGNOSIS — D1801 Hemangioma of skin and subcutaneous tissue: Secondary | ICD-10-CM | POA: Diagnosis not present

## 2023-05-09 DIAGNOSIS — L814 Other melanin hyperpigmentation: Secondary | ICD-10-CM | POA: Diagnosis not present

## 2023-05-09 DIAGNOSIS — Z85828 Personal history of other malignant neoplasm of skin: Secondary | ICD-10-CM | POA: Diagnosis not present

## 2023-05-09 DIAGNOSIS — L578 Other skin changes due to chronic exposure to nonionizing radiation: Secondary | ICD-10-CM | POA: Diagnosis not present

## 2023-05-09 DIAGNOSIS — L821 Other seborrheic keratosis: Secondary | ICD-10-CM | POA: Diagnosis not present

## 2023-05-24 DIAGNOSIS — J029 Acute pharyngitis, unspecified: Secondary | ICD-10-CM | POA: Diagnosis not present

## 2023-05-24 DIAGNOSIS — J019 Acute sinusitis, unspecified: Secondary | ICD-10-CM | POA: Diagnosis not present

## 2023-06-27 DIAGNOSIS — H1045 Other chronic allergic conjunctivitis: Secondary | ICD-10-CM | POA: Diagnosis not present

## 2023-06-27 DIAGNOSIS — H2513 Age-related nuclear cataract, bilateral: Secondary | ICD-10-CM | POA: Diagnosis not present

## 2023-09-01 DIAGNOSIS — H16223 Keratoconjunctivitis sicca, not specified as Sjogren's, bilateral: Secondary | ICD-10-CM | POA: Diagnosis not present

## 2023-10-10 DIAGNOSIS — D72819 Decreased white blood cell count, unspecified: Secondary | ICD-10-CM | POA: Diagnosis not present

## 2023-10-10 DIAGNOSIS — M8580 Other specified disorders of bone density and structure, unspecified site: Secondary | ICD-10-CM | POA: Diagnosis not present

## 2023-10-10 DIAGNOSIS — R7989 Other specified abnormal findings of blood chemistry: Secondary | ICD-10-CM | POA: Diagnosis not present

## 2023-10-10 DIAGNOSIS — Z1212 Encounter for screening for malignant neoplasm of rectum: Secondary | ICD-10-CM | POA: Diagnosis not present

## 2023-10-10 DIAGNOSIS — R739 Hyperglycemia, unspecified: Secondary | ICD-10-CM | POA: Diagnosis not present

## 2023-10-10 DIAGNOSIS — E039 Hypothyroidism, unspecified: Secondary | ICD-10-CM | POA: Diagnosis not present

## 2023-10-10 DIAGNOSIS — R946 Abnormal results of thyroid function studies: Secondary | ICD-10-CM | POA: Diagnosis not present

## 2023-10-19 DIAGNOSIS — R739 Hyperglycemia, unspecified: Secondary | ICD-10-CM | POA: Diagnosis not present

## 2023-10-19 DIAGNOSIS — N3281 Overactive bladder: Secondary | ICD-10-CM | POA: Diagnosis not present

## 2023-10-19 DIAGNOSIS — I493 Ventricular premature depolarization: Secondary | ICD-10-CM | POA: Diagnosis not present

## 2023-10-19 DIAGNOSIS — Z853 Personal history of malignant neoplasm of breast: Secondary | ICD-10-CM | POA: Diagnosis not present

## 2023-10-19 DIAGNOSIS — M8580 Other specified disorders of bone density and structure, unspecified site: Secondary | ICD-10-CM | POA: Diagnosis not present

## 2023-10-19 DIAGNOSIS — Z0000 Encounter for general adult medical examination without abnormal findings: Secondary | ICD-10-CM | POA: Diagnosis not present

## 2023-10-19 DIAGNOSIS — R002 Palpitations: Secondary | ICD-10-CM | POA: Diagnosis not present

## 2023-10-19 DIAGNOSIS — G4700 Insomnia, unspecified: Secondary | ICD-10-CM | POA: Diagnosis not present

## 2023-10-19 DIAGNOSIS — R946 Abnormal results of thyroid function studies: Secondary | ICD-10-CM | POA: Diagnosis not present

## 2023-10-19 DIAGNOSIS — R0789 Other chest pain: Secondary | ICD-10-CM | POA: Diagnosis not present

## 2023-10-19 DIAGNOSIS — E663 Overweight: Secondary | ICD-10-CM | POA: Diagnosis not present

## 2023-10-19 DIAGNOSIS — M1990 Unspecified osteoarthritis, unspecified site: Secondary | ICD-10-CM | POA: Diagnosis not present

## 2023-10-19 DIAGNOSIS — R82998 Other abnormal findings in urine: Secondary | ICD-10-CM | POA: Diagnosis not present

## 2023-11-14 ENCOUNTER — Other Ambulatory Visit: Payer: Self-pay | Admitting: Internal Medicine

## 2023-11-14 DIAGNOSIS — Z1231 Encounter for screening mammogram for malignant neoplasm of breast: Secondary | ICD-10-CM

## 2023-12-26 ENCOUNTER — Ambulatory Visit

## 2023-12-26 DIAGNOSIS — L905 Scar conditions and fibrosis of skin: Secondary | ICD-10-CM | POA: Diagnosis not present

## 2023-12-26 DIAGNOSIS — L570 Actinic keratosis: Secondary | ICD-10-CM | POA: Diagnosis not present

## 2023-12-26 DIAGNOSIS — D485 Neoplasm of uncertain behavior of skin: Secondary | ICD-10-CM | POA: Diagnosis not present

## 2023-12-26 DIAGNOSIS — C44722 Squamous cell carcinoma of skin of right lower limb, including hip: Secondary | ICD-10-CM | POA: Diagnosis not present

## 2023-12-28 ENCOUNTER — Ambulatory Visit
Admission: RE | Admit: 2023-12-28 | Discharge: 2023-12-28 | Disposition: A | Discharge status: Home / Self Care | Source: Ambulatory Visit | Attending: Internal Medicine | Admitting: Internal Medicine

## 2023-12-28 DIAGNOSIS — Z1231 Encounter for screening mammogram for malignant neoplasm of breast: Secondary | ICD-10-CM

## 2024-01-16 DIAGNOSIS — C44722 Squamous cell carcinoma of skin of right lower limb, including hip: Secondary | ICD-10-CM | POA: Diagnosis not present

## 2024-02-16 ENCOUNTER — Other Ambulatory Visit (HOSPITAL_COMMUNITY)
Admission: RE | Admit: 2024-02-16 | Discharge: 2024-02-16 | Disposition: A | Source: Ambulatory Visit | Attending: Obstetrics and Gynecology | Admitting: Obstetrics and Gynecology

## 2024-02-16 ENCOUNTER — Encounter: Payer: Self-pay | Admitting: Obstetrics and Gynecology

## 2024-02-16 ENCOUNTER — Ambulatory Visit (INDEPENDENT_AMBULATORY_CARE_PROVIDER_SITE_OTHER): Admitting: Obstetrics and Gynecology

## 2024-02-16 VITALS — BP 112/58 | HR 68 | Temp 98.0°F | Ht 70.0 in | Wt 174.0 lb

## 2024-02-16 DIAGNOSIS — Z9189 Other specified personal risk factors, not elsewhere classified: Secondary | ICD-10-CM

## 2024-02-16 DIAGNOSIS — Z1151 Encounter for screening for human papillomavirus (HPV): Secondary | ICD-10-CM | POA: Insufficient documentation

## 2024-02-16 DIAGNOSIS — Z1331 Encounter for screening for depression: Secondary | ICD-10-CM

## 2024-02-16 DIAGNOSIS — Z124 Encounter for screening for malignant neoplasm of cervix: Secondary | ICD-10-CM | POA: Diagnosis not present

## 2024-02-16 DIAGNOSIS — Z01419 Encounter for gynecological examination (general) (routine) without abnormal findings: Secondary | ICD-10-CM | POA: Insufficient documentation

## 2024-02-16 DIAGNOSIS — Z853 Personal history of malignant neoplasm of breast: Secondary | ICD-10-CM

## 2024-02-16 NOTE — Assessment & Plan Note (Addendum)
 Cervical cancer screening performed according to ASCCP guidelines. Encouraged annual mammogram screening Colonoscopy UTD DXA due, will f/u with PCP Labs and immunizations with her primary Encouraged safe sexual practices as indicated Encouraged healthy lifestyle practices with diet and exercise For patients under 50-66yo, I recommend 1200mg  calcium daily and 600IU of vitamin D  daily.

## 2024-02-16 NOTE — Patient Instructions (Signed)
 For patients under 50-66yo, I recommend 1200mg  calcium  daily and 600IU of vitamin D daily. For patients over 66yo, I recommend 1200mg  calcium  daily and 800IU of vitamin D daily.  Health Maintenance, Female Adopting a healthy lifestyle and getting preventive care are important in promoting health and wellness. Ask your health care provider about: The right schedule for you to have regular tests and exams. Things you can do on your own to prevent diseases and keep yourself healthy. What should I know about diet, weight, and exercise? Eat a healthy diet  Eat a diet that includes plenty of vegetables, fruits, low-fat dairy products, and lean protein. Do not eat a lot of foods that are high in solid fats, added sugars, or sodium. Maintain a healthy weight Body mass index (BMI) is used to identify weight problems. It estimates body fat based on height and weight. Your health care provider can help determine your BMI and help you achieve or maintain a healthy weight. Get regular exercise Get regular exercise. This is one of the most important things you can do for your health. Most adults should: Exercise for at least 150 minutes each week. The exercise should increase your heart rate and make you sweat (moderate-intensity exercise). Do strengthening exercises at least twice a week. This is in addition to the moderate-intensity exercise. Spend less time sitting. Even light physical activity can be beneficial. Watch cholesterol and blood lipids Have your blood tested for lipids and cholesterol at 66 years of age, then have this test every 5 years. Have your cholesterol levels checked more often if: Your lipid or cholesterol levels are high. You are older than 65 years of age. You are at high risk for heart disease. What should I know about cancer screening? Depending on your health history and family history, you may need to have cancer screening at various ages. This may include screening  for: Breast cancer. Cervical cancer. Colorectal cancer. Skin cancer. Lung cancer. What should I know about heart disease, diabetes, and high blood pressure? Blood pressure and heart disease High blood pressure causes heart disease and increases the risk of stroke. This is more likely to develop in people who have high blood pressure readings or are overweight. Have your blood pressure checked: Every 3-5 years if you are 25-57 years of age. Every year if you are 24 years old or older. Diabetes Have regular diabetes screenings. This checks your fasting blood sugar level. Have the screening done: Once every three years after age 62 if you are at a normal weight and have a low risk for diabetes. More often and at a younger age if you are overweight or have a high risk for diabetes. What should I know about preventing infection? Hepatitis B If you have a higher risk for hepatitis B, you should be screened for this virus. Talk with your health care provider to find out if you are at risk for hepatitis B infection. Hepatitis C Testing is recommended for: Everyone born from [birth year] through 1963. Anyone with known risk factors for hepatitis C. Sexually transmitted infections (STIs) Get screened for STIs, including gonorrhea and chlamydia, if: You are sexually active and are younger than 66 years of age. You are older than 66 years of age and your health care provider tells you that you are at risk for this type of infection. Your sexual activity has changed since you were last screened, and you are at increased risk for chlamydia or gonorrhea. Ask your health care provider if  you are at risk. Ask your health care provider about whether you are at high risk for HIV. Your health care provider may recommend a prescription medicine to help prevent HIV infection. If you choose to take medicine to prevent HIV, you should first get tested for HIV. You should then be tested every 3 months for as long as you  are taking the medicine. Osteoporosis and menopause Osteoporosis is a disease in which the bones lose minerals and strength with aging. This can result in bone fractures. If you are 72 years old or older, or if you are at risk for osteoporosis and fractures, ask your health care provider if you should: Be screened for bone loss. Take a calcium  or vitamin D supplement to lower your risk of fractures. Be given hormone replacement therapy (HRT) to treat symptoms of menopause. Follow these instructions at home: Alcohol use Do not drink alcohol if: Your health care provider tells you not to drink. You are pregnant, may be pregnant, or are planning to become pregnant. If you drink alcohol: Limit how much you have to: 0-1 drink a day. Know how much alcohol is in your drink. In the [place] one drink equals one 12 oz bottle of beer (355 mL), one 5 oz glass of wine (148 mL), or one 1 oz glass of hard liquor (44 mL). Lifestyle Do not use any products that contain nicotine or tobacco. These products include cigarettes, chewing tobacco, and vaping devices, such as e-cigarettes. If you need help quitting, ask your health care provider. Do not use street drugs. Do not share needles. Ask your health care provider for help if you need support or information about quitting drugs. General instructions Schedule regular health, dental, and eye exams. Stay current with your vaccines. Tell your health care provider if: You often feel depressed. You have ever been abused or do not feel safe at home. Summary Adopting a healthy lifestyle and getting preventive care are important in promoting health and wellness. Follow your health care provider's instructions about healthy diet, exercising, and getting tested or screened for diseases. Follow your health care provider's instructions on monitoring your cholesterol and blood pressure. This information is not intended to replace advice given to you by your health  care provider. Make sure you discuss any questions you have with your health care provider. Document Revised: 08/04/2020 Document Reviewed: 08/04/2020 Elsevier Patient Education  2024 [employer].

## 2024-02-16 NOTE — Progress Notes (Addendum)
 68 y.o. [id number] postmenopausal female with H/O left breast cancer (2013), neg genetic testing 2020 here for HR B&P exam. Married. PCP: Onita Rush, MD   She reports no concerns. Urine sample provided: No  Postmenopausal bleeding: none Pelvic discharge or pain: none Breast mass, nipple discharge or skin changes : none Sexually active: Not currently   Last PAP:     Component Value Date/Time   DIAGPAP  10/04/2018 0000    NEGATIVE FOR INTRAEPITHELIAL LESIONS OR MALIGNANCY.   DIAGPAP  09/27/2017 0000    NEGATIVE FOR INTRAEPITHELIAL LESIONS OR MALIGNANCY.   ADEQPAP  10/04/2018 0000    Satisfactory for evaluation  endocervical/transformation zone component PRESENT.   ADEQPAP  09/27/2017 0000    Satisfactory for evaluation  endocervical/transformation zone component PRESENT.   Last mammogram: 12/28/23 birads 2, density b Last DXA: 12/21/17 normal Last colonoscopy: 09/21/17 Q10Y  Exercising: yes, tennis, pilates, walk, golf, strength training and cardio 5 days a week Smoker:No  Flowsheet Row Office Visit from 02/16/2024 in [facility]  PHQ-2 Total Score 0     GYN HISTORY: H/o left breast cancer Risk Factors for Medicare Patients >/= 5 sexual partners in a lifetime: Yes  OB History  Gravida Para Term Preterm AB Living  3 2 2  1 2   SAB IAB Ectopic Multiple Live Births      2    # Outcome Date GA Lbr Len/2nd Weight Sex Type Anes PTL Lv  3 Term      Vag-Spont   LIV  2 Term      Vag-Spont   LIV  1 AB            Past Medical History:  Diagnosis Date   Abnormal Pap smear of cervix    yrs ago, just a repeat done   Arthritis    Breast cancer (HCC)    Invasive ductal ca grade 1 er/pr positive, her2  negative   Family history of cancer of mouth    Family history of lung cancer    Family history of prostate cancer    Fatigue    H/O colonoscopy 2003   Hot flashes    Insomnia    Personal history of radiation therapy    PONV (postoperative  nausea and vomiting)    Psoriasis    Wears glasses    Past Surgical History:  Procedure Laterality Date   BREAST LUMPECTOMY Left 11/01/2011   left breast lumpectomy   BREAST SURGERY  10/1976   right breast benign tumor   DILATION AND CURETTAGE OF UTERUS     KNEE SURGERY  2005, 2007   bilateral    LYMPH GLAND EXCISION  1978   right axillary   MANDIBLE SURGERY  1982   POLYPECTOMY     TOTAL KNEE ARTHROPLASTY Right 05/17/2022   Procedure: TOTAL KNEE ARTHROPLASTY;  Surgeon: Melodi Lerner, MD;  Location: [facility];  Service: Orthopedics;  Laterality: Right;   Current Outpatient Medications on File Prior to Visit  Medication Sig Dispense Refill   cycloSPORINE (RESTASIS) 0.05 % ophthalmic emulsion 1 drop 2 (two) times daily.     Melatonin 5 MG TABS Take 5 mg by mouth at bedtime.     Multiple Vitamin (MULTIVITAMIN WITH MINERALS) TABS tablet Take 1 tablet by mouth 3 (three) times a week. Centrum Ultra Women's Multivitamin     Roflumilast  (ZORYVE ) 0.3 % CREA Apply 1 Application topically daily as needed (psoriasis).     No current facility-administered medications  on file prior to visit.   Social History   Socioeconomic History   Marital status: Married    Spouse name: Not on file   Number of children: Not on file   Years of education: Not on file   Highest education level: Not on file  Occupational History   Not on file  Tobacco Use   Smoking status: Never   Smokeless tobacco: Never  Vaping Use   Vaping status: Never Used  Substance and Sexual Activity   Alcohol use: Yes    Alcohol/week: 5.0 standard drinks of alcohol    Types: 5 Standard drinks or equivalent per week   Drug use: No   Sexual activity: Not Currently    Partners: Male    Birth control/protection: Other-see comments    Comment: husband vasectomy, 5+ partners  Other Topics Concern   Not on file  Social History Narrative   Not on file   Social Drivers of Health   Financial Resource Strain: Not on file  Food  Insecurity: No Food Insecurity (05/17/2022)   Hunger Vital Sign    Worried About Running Out of Food in the Last Year: Never true    Ran Out of Food in the Last Year: Never true  Transportation Needs: No Transportation Needs (05/17/2022)   PRAPARE - Administrator, Civil Service (Medical): No    Lack of Transportation (Non-Medical): No  Physical Activity: Not on file  Stress: Not on file  Social Connections: Not on file  Intimate Partner Violence: Not At Risk (05/17/2022)   Humiliation, Afraid, Rape, and Kick questionnaire    Fear of Current or Ex-Partner: No    Emotionally Abused: No    Physically Abused: No    Sexually Abused: No   Family History  Problem Relation Age of Onset   Breast cancer Mother 27       Inflammatory breast cancer   Heart attack Father    Tongue cancer Brother 56   Lung cancer Brother 15   Prostate cancer Brother 99       not metastatic, treated with radiation   Cancer - Other Sister 87       vulvar cancer   Cancer Maternal Uncle        unknown cancer, diagnosed late 77s   Allergies  Allergen Reactions   Sulfa Antibiotics Other (See Comments)    Yeast infections and ulcers   Sulfa Drugs Cross Reactors Other (See Comments)    Yeast infections and ulcers   Tape Rash   Turmeric Rash      PE Today's Vitals   02/16/24 1142  BP: (!) 112/58  Pulse: 68  Temp: 98 F (36.7 C)  TempSrc: Oral  SpO2: 98%  Weight: 174 lb (78.9 kg)  Height: 5' 10 (1.778 m)   Body mass index is 24.97 kg/m.  Physical Exam Vitals reviewed. Exam conducted with a chaperone present.  Constitutional:      General: She is not in acute distress.    Appearance: Normal appearance.  HENT:     Head: Normocephalic and atraumatic.     Nose: Nose normal.  Eyes:     Extraocular Movements: Extraocular movements intact.     Conjunctiva/sclera: Conjunctivae normal.  Pulmonary:     Effort: Pulmonary effort is normal.  Chest:     Chest wall: No mass or tenderness.   Breasts:    Right: Normal. No swelling, mass, nipple discharge, skin change or tenderness.     Left:  Normal. No swelling, mass, nipple discharge, skin change or tenderness.       Comments: Prior surgical sites Abdominal:     General: There is no distension.     Palpations: Abdomen is soft.     Tenderness: There is no abdominal tenderness.  Genitourinary:    General: Normal vulva.     Exam position: Lithotomy position.     Urethra: No prolapse.     Vagina: Normal. No vaginal discharge or bleeding.     Cervix: Normal. No lesion.     Uterus: Normal. Not enlarged and not tender.      Adnexa: Right adnexa normal and left adnexa normal.  Musculoskeletal:        General: Normal range of motion.     Cervical back: Normal range of motion.  Lymphadenopathy:     Upper Body:     Right upper body: No axillary adenopathy.     Left upper body: No axillary adenopathy.     Lower Body: No right inguinal adenopathy. No left inguinal adenopathy.  Skin:    General: Skin is warm and dry.  Neurological:     General: No focal deficit present.     Mental Status: She is alert.  Psychiatric:        Mood and Affect: Mood normal.        Behavior: Behavior normal.      Assessment and Plan:        GYN exam for high-risk Medicare patient Assessment & Plan: Cervical cancer screening performed according to ASCCP guidelines. Encouraged annual mammogram screening Colonoscopy UTD DXA due, will f/u with PCP Labs and immunizations with her primary Encouraged safe sexual practices as indicated Encouraged healthy lifestyle practices with diet and exercise For patients under 50-70yo, I recommend 1200mg  calcium daily and 600IU of vitamin D daily.    Negative depression screening  History of breast cancer Normal exam, MMG UTD F/u in 1 yr  Cervical cancer screening -     Cytology - PAP   Lorey Pallett LULLA Pa, MD

## 2024-02-16 NOTE — Addendum Note (Signed)
 Addended by: DALLIE BOLLARD V on: 02/16/2024 01:53 PM   Modules accepted: Orders

## 2024-02-20 ENCOUNTER — Ambulatory Visit: Payer: Self-pay | Admitting: Obstetrics and Gynecology

## 2024-02-20 LAB — CYTOLOGY - PAP
Comment: NEGATIVE
Diagnosis: NEGATIVE
High risk HPV: NEGATIVE
